# Patient Record
Sex: Male | Born: 1940 | Race: Asian | Hispanic: No | Marital: Married | Smoking: Former smoker
Health system: Southern US, Community
[De-identification: ages and names within clinical notes are randomized; demographics above are authoritative.]

## PROBLEM LIST (undated history)

## (undated) DIAGNOSIS — N189 Chronic kidney disease, unspecified: Secondary | ICD-10-CM

## (undated) DIAGNOSIS — R0602 Shortness of breath: Secondary | ICD-10-CM

## (undated) DIAGNOSIS — I351 Nonrheumatic aortic (valve) insufficiency: Secondary | ICD-10-CM

## (undated) DIAGNOSIS — E119 Type 2 diabetes mellitus without complications: Secondary | ICD-10-CM

## (undated) DIAGNOSIS — K7460 Unspecified cirrhosis of liver: Secondary | ICD-10-CM

## (undated) DIAGNOSIS — E785 Hyperlipidemia, unspecified: Secondary | ICD-10-CM

## (undated) DIAGNOSIS — R011 Cardiac murmur, unspecified: Secondary | ICD-10-CM

## (undated) DIAGNOSIS — K219 Gastro-esophageal reflux disease without esophagitis: Secondary | ICD-10-CM

## (undated) DIAGNOSIS — I340 Nonrheumatic mitral (valve) insufficiency: Secondary | ICD-10-CM

## (undated) DIAGNOSIS — M109 Gout, unspecified: Secondary | ICD-10-CM

## (undated) DIAGNOSIS — J42 Unspecified chronic bronchitis: Secondary | ICD-10-CM

## (undated) DIAGNOSIS — R079 Chest pain, unspecified: Secondary | ICD-10-CM

## (undated) DIAGNOSIS — J45909 Unspecified asthma, uncomplicated: Secondary | ICD-10-CM

## (undated) DIAGNOSIS — I10 Essential (primary) hypertension: Secondary | ICD-10-CM

## (undated) DIAGNOSIS — R569 Unspecified convulsions: Secondary | ICD-10-CM

## (undated) HISTORY — DX: Chest pain, unspecified: R07.9

## (undated) HISTORY — PX: CATARACT EXTRACTION W/ INTRAOCULAR LENS  IMPLANT, BILATERAL: SHX1307

## (undated) HISTORY — PX: CARDIAC VALVE REPLACEMENT: SHX585

## (undated) HISTORY — DX: Shortness of breath: R06.02

## (undated) HISTORY — DX: Nonrheumatic aortic (valve) insufficiency: I35.1

## (undated) HISTORY — DX: Hyperlipidemia, unspecified: E78.5

## (undated) HISTORY — DX: Nonrheumatic mitral (valve) insufficiency: I34.0

## (undated) HISTORY — PX: CARDIAC CATHETERIZATION: SHX172

## (undated) HISTORY — DX: Unspecified cirrhosis of liver: K74.60

## (undated) HISTORY — DX: Essential (primary) hypertension: I10

## (undated) HISTORY — DX: Gastro-esophageal reflux disease without esophagitis: K21.9

---

## 2008-07-04 ENCOUNTER — Encounter: Admission: RE | Admit: 2008-07-04 | Discharge: 2008-07-04 | Payer: Self-pay | Admitting: Pulmonary Disease

## 2008-08-09 ENCOUNTER — Ambulatory Visit (HOSPITAL_COMMUNITY): Admission: RE | Admit: 2008-08-09 | Discharge: 2008-08-09 | Payer: Self-pay | Admitting: General Practice

## 2009-02-15 ENCOUNTER — Encounter: Admission: RE | Admit: 2009-02-15 | Discharge: 2009-02-15 | Payer: Self-pay | Admitting: Infectious Diseases

## 2009-04-09 ENCOUNTER — Emergency Department (HOSPITAL_COMMUNITY): Admission: EM | Admit: 2009-04-09 | Discharge: 2009-04-09 | Payer: Self-pay | Admitting: Emergency Medicine

## 2009-07-22 ENCOUNTER — Ambulatory Visit: Payer: Self-pay | Admitting: Physician Assistant

## 2009-07-22 DIAGNOSIS — I10 Essential (primary) hypertension: Secondary | ICD-10-CM | POA: Insufficient documentation

## 2009-07-23 ENCOUNTER — Encounter: Payer: Self-pay | Admitting: Physician Assistant

## 2009-07-24 ENCOUNTER — Encounter: Payer: Self-pay | Admitting: Physician Assistant

## 2009-07-24 DIAGNOSIS — Z8639 Personal history of other endocrine, nutritional and metabolic disease: Secondary | ICD-10-CM

## 2009-07-24 DIAGNOSIS — Z862 Personal history of diseases of the blood and blood-forming organs and certain disorders involving the immune mechanism: Secondary | ICD-10-CM | POA: Insufficient documentation

## 2009-07-24 DIAGNOSIS — R7309 Other abnormal glucose: Secondary | ICD-10-CM | POA: Insufficient documentation

## 2009-07-24 LAB — CONVERTED CEMR LAB
ALT: 76 units/L — ABNORMAL HIGH (ref 0–53)
Albumin: 4.2 g/dL (ref 3.5–5.2)
Barbiturate Quant, Ur: NEGATIVE
CO2: 26 meq/L (ref 19–32)
Calcium: 9.6 mg/dL (ref 8.4–10.5)
Chloride: 101 meq/L (ref 96–112)
Creatinine, Ser: 1.31 mg/dL (ref 0.40–1.50)
Creatinine,U: 267.7 mg/dL
Eosinophils Absolute: 0.4 10*3/uL (ref 0.0–0.7)
Eosinophils Relative: 5 % (ref 0–5)
HCT: 49.5 % (ref 39.0–52.0)
Lymphs Abs: 2.1 10*3/uL (ref 0.7–4.0)
MCV: 90.3 fL (ref 78.0–100.0)
Methadone: NEGATIVE
Monocytes Relative: 7 % (ref 3–12)
Neutrophils Relative %: 60 % (ref 43–77)
Opiate Screen, Urine: NEGATIVE
Phencyclidine (PCP): NEGATIVE
Potassium: 3.8 meq/L (ref 3.5–5.3)
Propoxyphene: NEGATIVE
RBC: 5.48 M/uL (ref 4.22–5.81)
TSH: 1.033 microintl units/mL (ref 0.350–4.500)
WBC: 7.5 10*3/uL (ref 4.0–10.5)

## 2009-07-29 ENCOUNTER — Ambulatory Visit: Payer: Self-pay | Admitting: Physician Assistant

## 2009-07-30 DIAGNOSIS — E785 Hyperlipidemia, unspecified: Secondary | ICD-10-CM | POA: Insufficient documentation

## 2009-07-30 LAB — CONVERTED CEMR LAB
Calcium: 9.2 mg/dL (ref 8.4–10.5)
Chloride: 103 meq/L (ref 96–112)
Cholesterol: 203 mg/dL — ABNORMAL HIGH (ref 0–200)
Creatinine, Ser: 1.23 mg/dL (ref 0.40–1.50)
HDL goal, serum: 40 mg/dL
HDL: 35 mg/dL — ABNORMAL LOW (ref >39)
Triglycerides: 195 mg/dL — ABNORMAL HIGH (ref <150)

## 2009-08-01 ENCOUNTER — Ambulatory Visit (HOSPITAL_COMMUNITY): Admission: RE | Admit: 2009-08-01 | Discharge: 2009-08-01 | Payer: Self-pay | Admitting: Internal Medicine

## 2009-08-01 ENCOUNTER — Encounter (INDEPENDENT_AMBULATORY_CARE_PROVIDER_SITE_OTHER): Payer: Self-pay | Admitting: Internal Medicine

## 2009-08-02 ENCOUNTER — Encounter: Payer: Self-pay | Admitting: Physician Assistant

## 2009-08-02 DIAGNOSIS — I359 Nonrheumatic aortic valve disorder, unspecified: Secondary | ICD-10-CM | POA: Insufficient documentation

## 2009-08-05 ENCOUNTER — Ambulatory Visit: Payer: Self-pay | Admitting: Nurse Practitioner

## 2009-08-05 ENCOUNTER — Encounter: Payer: Self-pay | Admitting: Physician Assistant

## 2009-08-07 ENCOUNTER — Encounter (INDEPENDENT_AMBULATORY_CARE_PROVIDER_SITE_OTHER): Payer: Self-pay | Admitting: *Deleted

## 2009-08-08 LAB — CONVERTED CEMR LAB
HCV Ab: NEGATIVE
Hep A Total Ab: POSITIVE — AB
Hep B Core Total Ab: NEGATIVE
Hep B S Ab: NEGATIVE
Hgb A1c MFr Bld: 5.4 % (ref <5.7)

## 2009-08-13 ENCOUNTER — Encounter (INDEPENDENT_AMBULATORY_CARE_PROVIDER_SITE_OTHER): Payer: Self-pay | Admitting: *Deleted

## 2009-09-20 ENCOUNTER — Ambulatory Visit: Payer: Self-pay | Admitting: Physician Assistant

## 2009-09-20 ENCOUNTER — Telehealth (INDEPENDENT_AMBULATORY_CARE_PROVIDER_SITE_OTHER): Payer: Self-pay | Admitting: *Deleted

## 2009-09-20 DIAGNOSIS — R809 Proteinuria, unspecified: Secondary | ICD-10-CM | POA: Insufficient documentation

## 2009-09-20 LAB — CONVERTED CEMR LAB
Nitrite: NEGATIVE
Protein, U semiquant: 30
Specific Gravity, Urine: 1.025
Urobilinogen, UA: 0.2
pH: 5

## 2009-09-24 ENCOUNTER — Ambulatory Visit (HOSPITAL_COMMUNITY): Admission: RE | Admit: 2009-09-24 | Discharge: 2009-09-24 | Payer: Self-pay | Admitting: Internal Medicine

## 2009-09-24 DIAGNOSIS — R972 Elevated prostate specific antigen [PSA]: Secondary | ICD-10-CM | POA: Insufficient documentation

## 2009-09-24 LAB — CONVERTED CEMR LAB
ALT: 50 units/L (ref 0–53)
AST: 36 units/L (ref 0–37)
BUN: 23 mg/dL (ref 6–23)
Bacteria, UA: NONE SEEN
Calcium: 9.4 mg/dL (ref 8.4–10.5)
Casts: NONE SEEN /lpf
Creatinine, Ser: 1.29 mg/dL (ref 0.40–1.50)
Crystals: NONE SEEN
Ketones, ur: NEGATIVE mg/dL
Nitrite: NEGATIVE
PSA: 5.24 ng/mL — ABNORMAL HIGH (ref 0.10–4.00)
Specific Gravity, Urine: 1.015 (ref 1.005–1.030)
Squamous Epithelial / LPF: NONE SEEN /lpf
Total Bilirubin: 1.2 mg/dL (ref 0.3–1.2)
Urobilinogen, UA: 0.2 (ref 0.0–1.0)
pH: 5 (ref 5.0–8.0)

## 2009-09-27 ENCOUNTER — Telehealth: Payer: Self-pay | Admitting: Physician Assistant

## 2009-10-23 ENCOUNTER — Encounter: Payer: Self-pay | Admitting: Physician Assistant

## 2009-11-07 ENCOUNTER — Inpatient Hospital Stay (HOSPITAL_COMMUNITY): Admission: EM | Admit: 2009-11-07 | Discharge: 2009-11-08 | Payer: Self-pay | Admitting: Emergency Medicine

## 2009-11-07 ENCOUNTER — Encounter: Payer: Self-pay | Admitting: Physician Assistant

## 2009-11-23 ENCOUNTER — Encounter: Payer: Self-pay | Admitting: Physician Assistant

## 2010-03-16 ENCOUNTER — Emergency Department (HOSPITAL_COMMUNITY)
Admission: EM | Admit: 2010-03-16 | Discharge: 2010-03-16 | Payer: Self-pay | Source: Home / Self Care | Admitting: Emergency Medicine

## 2010-03-27 ENCOUNTER — Emergency Department (HOSPITAL_COMMUNITY)
Admission: EM | Admit: 2010-03-27 | Discharge: 2010-03-27 | Payer: Self-pay | Source: Home / Self Care | Admitting: Family Medicine

## 2010-04-20 ENCOUNTER — Encounter: Payer: Self-pay | Admitting: Internal Medicine

## 2010-04-29 NOTE — Progress Notes (Signed)
Summary: Referrals update  Phone Note From Other Clinic   Caller: Referral Coordinator Summary of Call: Pt is aware of the following appts Cardio: [name] Cardio 10-08-09 @ 10:30am with Dr.Varanasi GI : Nicholes Mango 10-21-09 @ 10:15am with Dr.Hung... Pt planning to R/S Urology: Pt states that he already has an appt Initial call taken by: Candi Leash,  September 27, 2009 9:04 AM  Follow-up for Phone Call        I don't think he has urology appt yet.  I just made that referral when his labs came back.  Patient must be mistaken.  Please check on again. Follow-up by: Tereso Newcomer PA-C,  September 27, 2009 1:38 PM  Additional Follow-up for Phone Call Additional follow up Details #1::        Pt was correct he has an F/U appt @ [facility] Urological on 11-18-09 @ 8:50am with Dr.Eskew Additional Follow-up by: Candi Leash,  October 04, 2009 11:01 AM    Additional Follow-up for Phone Call Additional follow up Details #2::    Please send my notes and labs to his urologist.  PSA was elevated. Tereso Newcomer PA-C  October 04, 2009 2:58 PM   Additional Follow-up for Phone Call Additional follow up Details #3:: Details for Additional Follow-up Action Taken: done... Additional Follow-up by: Candi Leash,  October 04, 2009 3:54 PM

## 2010-04-29 NOTE — Progress Notes (Signed)
Summary: Office Visit//DEPRESSION SCREENING  Office Visit//DEPRESSION SCREENING   Imported By: Arta Bruce 10/14/2009 09:53:54  _____________________________________________________________________  External Attachment:    Type:   Image     Comment:   External Document

## 2010-04-29 NOTE — Miscellaneous (Signed)
Summary: Colonoscopy Normal 8.2011 . . . Marland Kitchenrepeat 2021  Clinical Lists Changes  Observations: Added new observation of COLONNXTDUE: 10/2019 (11/23/2009 16:11) Added new observation of PRIMARY MD: Tereso Newcomer, PA-C (11/23/2009 16:11) Added new observation of COLONOSCOPY: normal (11/07/2009 16:12) Added new observation of COLONOSCOPY:  Location:  [facility].    (11/07/2009 16:12) Added new observation of COLONRECACT: Repeat colonoscopy in 10 years.   (11/07/2009 16:11) Added new observation of COLONOSCOPY:  Results: Normal. Results: Hemorrhoids.      (11/07/2009 16:11)      Colonoscopy  Procedure date:  11/07/2009  Findings:       Results: Normal. Results: Hemorrhoids.       Comments:      Repeat colonoscopy in 10 years.    Colonoscopy  Procedure date:  11/07/2009  Findings:       Location:  [facility].      Preventive Care Screening  Colonoscopy:    Date:  11/07/2009    Next Due:  10/2019    Results:   Results: Normal. Results: Hemorrhoids.

## 2010-04-29 NOTE — Letter (Signed)
Summary: [facility]  [facility]   Imported By: Arta Bruce 11/28/2009 15:37:30  _____________________________________________________________________  External Attachment:    Type:   Image     Comment:   External Document

## 2010-04-29 NOTE — Letter (Signed)
Summary: TEST ORDER FORM//ULTRASOUND  TEST ORDER FORM//ULTRASOUND   Imported By: Arta Bruce 08/02/2009 10:37:34  _____________________________________________________________________  External Attachment:    Type:   Image     Comment:   External Document

## 2010-04-29 NOTE — Letter (Signed)
Summary: *HSN Results Follow up  [facility]  [address] [place], [place] [address]   Phone: [phone]  Fax: [phone]      08/13/2009   Ryan Burke [address] [place], [place]  [address]   Dear  Mr. [name],                            ____S.Drinkard,FNP   __X__S. Weaver,PA       ____B. McPherson,MD   ____V. Rankins,MD    ____E. Mulberry,MD    ____N. Daphine Deutscher, FNP  ____D. Reche Dixon, MD    ____K. Philipp Deputy, MD    ____Other     This letter is to inform you that your recent test(s):  _______Pap Smear    ___X____Lab Test     _______X-ray    _______ is within acceptable limits  _______ requires a medication change  ____X___ requires a follow-up lab visit  _______ requires a follow-up visit with your provider   Comments:   We have been unable to reach you by phone. You do not have diabetes. [name] would like for you to schedule a lab visit  to check your liver. Please call our office to schedule.        _________________________________________________________ If you have any questions, please contact our office                     Sincerely,  Dutch Quint RN [facility]

## 2010-04-29 NOTE — Letter (Signed)
Summary: TEST ORDER FORM//ULTRASOUND//APPT DATE & TIME  TEST ORDER FORM//ULTRASOUND//APPT DATE & TIME   Imported By: Arta Bruce 10/02/2009 15:49:07  _____________________________________________________________________  External Attachment:    Type:   Image     Comment:   External Document

## 2010-04-29 NOTE — Assessment & Plan Note (Signed)
Summary: FU FOR CPE///KT   Vital Signs:  Patient profile:   70 year old male Weight:      146.3 pounds Temp:     97.7 degrees F oral Pulse rate:   64 / minute Pulse rhythm:   regular Resp:     24 per minute BP sitting:   111 / 65  (right arm)  Vitals Entered By: Arm[place] Shannon (September 20, 2009 11:27 AM) CC: follow-up visit, CPE, Hypertension Management Is Patient Diabetic? No Pain Assessment Patient in pain? no       Does patient need assistance? Functional Status Self care Ambulation Normal   Primary Care Provider:  Tereso Newcomer, PA-C  CC:  follow-up visit, CPE, and Hypertension Management.  History of Present Illness: Here for CPE.  Cataracts:  Had eye surgery.  Doing well.  Elevated LFTs:  No h/o alcohol abuse.  Hep labs neg for Hep B or Hep C.  No f/u LFTs.  No abd. u/s done yet.  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  Does note DOE.  Describes NYHA class 2.  No chest pain.  Had some dyspepsia with some med. he took several mos ago, but this stopped when he stopped the medication.  No syncope.  Sleeps on one pillow.  No PND.  No edema.  Health maint: PHQ9=6.  May not have completely understood questionaire.  Denies being depressed.  No suicidal thoughts.  Declines going to counseling.   Hypertension History:      Positive major cardiovascular risk factors include male age 1 years old or older, hyperlipidemia, and hypertension.  Negative major cardiovascular risk factors include non-tobacco-user status.        Further assessment for target organ damage reveals no history of ASHD, stroke/TIA, or peripheral vascular disease.    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 Aortic Insufficiency (mod to severe by Echo 07/2009)  Past Surgical History: Cataract extraction  Family History: Reviewed history from 07/22/2009 and no changes required. No colon or prostate cancer. No heart disease.  Social History: Reviewed history from 07/22/2009 and no changes required. Former Smoker   a.  15 pack years  Alcohol use-no Drug use-no Married  Review of Systems      See HPI General:  Denies chills and fever. Eyes:  Denies blurring. CV:  See HPI; Complains of shortness of breath with exertion; denies chest pain or discomfort and fainting. Resp:  Denies cough. GI:  Denies bloody stools and dark tarry stools. GU:  Denies dysuria and nocturia. MS:  Denies joint pain. Derm:  Denies rash. Neuro:  Denies headaches. Psych:  Denies depression and suicidal thoughts/plans. Heme:  Denies bleeding.  Physical Exam  General:  alert, well-developed, and well-nourished.   Head:  normocephalic and atraumatic.   Eyes:  pupils  equal, pupils round, pupils reactive to light, and no optic disk abnormalities.   Ears:  R ear normal and L ear normal.   Nose:  no external deformity.   Mouth:  OP pink patient unable to follow directions for exam Neck:  supple, no thyromegaly, no carotid bruits, and no cervical lymphadenopathy.   Chest Wall:  no deformities.   Breasts:  no gynecomastia.   Lungs:  normal breath sounds and no wheezes.   Heart:  normal rate and regular rhythm.   2/6 diastolic murmur at 3rd LICS  Abdomen:  soft, non-tender,  normal bowel sounds, and no hepatomegaly.   Rectal:  no external abnormalities, no hemorrhoids, normal sphincter tone, no masses, no tenderness, no fissures, no fistulae, and no perianal rash.   Genitalia:  uncircumcised, no hydrocele, no varicocele, no scrotal masses, no testicular masses or atrophy, no cutaneous lesions, and no urethral discharge.   Prostate:  no nodules, no asymmetry, no induration, and 1+ enlarged.   Msk:  normal ROM.   Pulses:  R posterior tibial normal, R dorsalis pedis normal, L posterior tibial normal, and L dorsalis pedis normal.   Extremities:  no edema Neurologic:  alert & oriented X3, cranial nerves II-XII intact, strength normal in all extremities, and DTRs symmetrical and normal.   Skin:  turgor normal.   Psych:  normally interactive.     Impression & Recommendations:  Problem # 1:  PROTEINURIA (ICD-791.0) 30 on u/a check repeat if +; get 24 hour uine  Problem # 2:  AORTIC INSUFFICIENCY, MODERATE (ICD-424.1) had fairly wide pulse pressure initially . . better now end diast int demension of LV 53 mm with upper limit of normal 52 mm has some SOB but is only NYHA 2 needs to see cardiology will put in referral  Orders: Cardiology Referral (Cardiology)  Problem # 3:  HYPERLIPIDEMIA (ICD-272.4) needs tx check up on LFTs first if fatty liver, start statin  Problem # 4:  LIVER FUNCTION TESTS, ABNORMAL, HX OF (ICD-V12.2) check repeat LFTs and u/s hep serologies were ok  Orders: T-Comprehensive Metabolic Panel ([address]) Ultrasound (Ultrasound)  Problem # 5:  PREVENTIVE HEALTH CARE (ICD-V70.0) discussed PSA . Marland Kitchen . wants to get it stool cards Td and Pnuemovax today PHQ9=6 but not sure he knew how to fill out . . .denies being sad  Orders: T-Hemoccult Cards-Multiple ([address]) T-PSA ([address]-91478) T-Urinalysis ([address]) Gastroenterology Referral (GI)  Problem # 6:  ESSENTIAL HYPERTENSION, BENIGN (ICD-401.1) Assessment: Improved f/u in 6  mos  His updated medication list for this problem includes:    Lisinopril 10 Mg Tabs (Lisinopril) .Marland Kitchen... Take 1 tablet by mouth once a day for blood pressure  Orders: T-Comprehensive Metabolic Panel ([address]295) T-Urinalysis ([address]-24401)  Complete Medication List: 1)  Lisinopril 10 Mg Tabs (Lisinopril) .... Take 1 tablet by mouth once a day for blood pressure  Hypertension Assessment/Plan:      The patient's hypertensive risk group is category B: At least one risk factor (excluding diabetes) with no target organ damage.  His calculated 10 year risk of coronary heart disease is 18 %.  Today's blood pressure is 111/65.     Patient Instructions: 1)  Td and Pneumovax today. 2)  Complete stool cards and return in the next 2 weeks. 3)  Someone will call you to arrange a cardiology and a gastroenterology appointment. 4)  Make sure we have a good phone number for you. 5)  Repeat U/A in 2 weeks. 6)  Please schedule a follow-up appointment in  6 months with Khaleesi Gruel for blood pressure.   Laboratory Results   Urine Tests    Routine Urinalysis   Glucose: negative   (Normal Range: Negative) Bilirubin: negative   (Normal Range: Negative) Ketone: negative   (Normal Range: Negative) Spec. Gravity: 1.025   (Normal Range: 1.003-1.035) Blood: negative   (Normal Range: Negative) pH: 5.0   (Normal Range: 5.0-8.0) Protein: 30   (Normal Range: Negative) Urobilinogen: 0.2   (Normal Range: 0-1) Nitrite: negative   (Normal Range: Negative) Leukocyte Esterace: negative   (Normal Range: Negative)

## 2010-04-29 NOTE — Letter (Signed)
Summary: *HSN Results Follow up  [facility]  [address] [place], [place] [address]   Phone: [phone]  Fax: [phone]      08/07/2009   BRUIN BOLGER [address] [place], [place]  [address]   Dear  Mr. Handy Boehle,                            ____S.Drinkard,FNP   ____D. Gore,FNP       ____B. McPherson,MD   ____V. Rankins,MD    ____E. Mulberry,MD    ____N. Daphine Deutscher, FNP  ____D. Reche Dixon, MD    ____K. Philipp Deputy, MD    ____Other     This letter is to inform you that your recent test(s):  _______Pap Smear    _______Lab Test     _______X-ray    _______ is within acceptable limits  _______ requires a medication change  _______ requires a follow-up lab visit  _______ requires a follow-up visit with your provider   Comments:  We have been trying to reach you.  Please give the office a call at your earliest convenience.       _________________________________________________________ If you have any questions, please contact our office                     Sincerely,  Armenia Shannon [facility]

## 2010-04-29 NOTE — Progress Notes (Signed)
Summary: Cardiology and GI referrals  Phone Note Outgoing Call   Summary of Call: 1.  Cardiology referral for moderate to severe aortic insufficiency.  2.  GI for screening colonoscopy.  Has medicaid. Initial call taken by: Brynda Rim,  September 20, 2009 12:53 PM      Tetanus/Td Vaccine    Vaccine Type: Tdap    Site: right deltoid    Mfr: Sanofi Pasteur    Dose: 0.5 ml    Route: IM    Given by: Armenia Shannon    Exp. Date: 01/30/2012    Lot #: [id number]    VIS given: 02/15/07 version given September 20, 2009.  Pneumovax Vaccine    Vaccine Type: Pneumovax    Site: left deltoid    Mfr: Merck    Dose: 0.5 ml    Route: IM    Given by: Armenia Shannon    Exp. Date: 04/30/2010    Lot #: [id number]    VIS given: 10/26/95 version given September 20, 2009.

## 2010-04-29 NOTE — Assessment & Plan Note (Signed)
Summary: NEW MEDICAID TO ESTAB///KT   Vital Signs:  Patient profile:   70 year old male Height:      62.5 inches Weight:      146.56 pounds BMI:     26.47 Temp:     98 degrees F Pulse rate:   69 / minute Pulse rhythm:   regular Resp:     24 per minute BP sitting:   161 / 72  (left arm) Cuff size:   regular  Vitals Entered By: Chauncy Passy, SMA  Serial Vital Signs/Assessments:  Time      Position  BP       Pulse  Resp  Temp     By 3:41 PM             142/64                         Tereso Newcomer PA-C 3:41 PM             144/60                         Tereso Newcomer PA-C  CC: New Pt. here to establish care w/us. Pt, via interpreter, states he has a cataract surgery w/Hecker opthal. on 08/30/09 and needs to have a complete PE done before surgery.  Is Patient Diabetic? No Pain Assessment Patient in pain? no       Does patient need assistance? Functional Status Self care Ambulation Normal   Primary Care Provider:  Tereso Newcomer, PA-C  CC:  New Pt. here to establish care w/us. Pt, via interpreter, and states he has a cataract surgery w/[facility]cker opthal. on 08/30/09 and needs to have a complete PE done before surgery. Marland Kitchen  History of Present Illness: New patient from [place].  Here for 15 months.  Needs cataract surgery.  Needs medical evaluation before.  Denies any complaints.  Son in law interprets.   Habits & Providers  Alcohol-Tobacco-Diet     Tobacco Status: quit  Exercise-Depression-Behavior     Drug Use: no  Current Medications (verified): 1)  None  Allergies (verified): No Known Drug Allergies  Past History:  Past Medical History: Cataracts  Family History: No colon or prostate cancer. No heart disease.  Social History: Former Smoker   a.  15 pack years  Alcohol use-no Drug use-no Married Smoking Status:  quit Drug Use:  no  Review of Systems      See HPI General:  Denies chills and fever. CV:  Denies chest pain or discomfort, difficulty breathing  at night, difficulty breathing while lying down, fainting, and shortness of breath with exertion. Resp:  Denies cough. GI:  Denies bloody stools and dark tarry stools. GU:  Denies hematuria.  Physical Exam  General:  alert, well-developed, and well-nourished.   Head:  normocephalic and atraumatic.   Eyes:  pupils equal, pupils round, pupils reactive to light, and no optic disk abnormalities.   Nose:  no external deformity.   Neck:  supple, no thyromegaly, no JVD, no carotid bruits, and no cervical lymphadenopathy.   Lungs:  normal breath sounds, no crackles, and no wheezes.   Heart:  normal S1 and S2 RRR 2/6 diastolic murmur heard best at the left 3 rd intercostal space  Abdomen:  soft, non-tender, normal bowel sounds, and no hepatomegaly.   Extremities:  no edema  Neurologic:  alert & oriented X3 and cranial nerves II-XII intact.  Skin:  turgor normal.   Psych:  normally interactive.     Impression & Recommendations:  Problem # 1:  ESSENTIAL HYPERTENSION, BENIGN (ICD-401.1)  mild will start lisinopril 10 mg once daily  needs to have his BP under control for his surgery also needs afterload reducer . .. see below LVH on EKG. . . may be from HTN; ? related to murmur echo is pending  Orders: 2 D Echo (2 D Echo) T-Comprehensive Metabolic Panel ([address]) T-TSH ([address]11-91478) EKG w/ Interpretation (93[address])  His updated medication list for this problem includes:    Lisinopril 10 Mg Tabs (Lisinopril) .Marland Kitchen... Take 1 tablet by mouth once a day for blood pressure  Problem # 2:  MURMUR (ICD-785.2)  has murmur of aortic insufficiency asymptomatic at this point get echo  Orders: 2 D Echo (2 D Echo)  Problem # 3:  Preventive Health Care (ICD-V70.0)  bring back for complete physical  Orders: T-Comprehensive Metabolic Panel ([address]) T-CBC w/Diff (57846-96295) T-Drug Screen-Urine, (single) (28413-24401) T-HIV Antibody  (Reflex) (02725-36644) T-Syphilis Test (RPR)  (03474-25956) T-TSH (38756-43329) EKG w/ Interpretation (93[address])  Complete Medication List: 1)  Lisinopril 10 Mg Tabs (Lisinopril) .... Take 1 tablet by mouth once a day for blood pressure  Patient Instructions: 1)  Return to the lab in 1 week for bloop pressure check and fasting labs: Lipids and BMET (Dx v70.0, 401.1) 2)  Schedule follow up with Lucy Boardman in 4-6 weeks for CPE. 3)  We will set you up for an echocardiogram to evaluate your heart murmur.  Please get the test done. Prescriptions: LISINOPRIL 10 MG TABS (LISINOPRIL) Take 1 tablet by mouth once a day for blood pressure  #30 x 5   Entered and Authorized by:   Tereso Newcomer PA-C   Signed by:   Tereso Newcomer PA-C on 07/22/2009   Method used:   Print then Give to Patient   RxID:   [id number]    EKG  Procedure date:  07/22/2009  Findings:      Normal sinus rhythm with rate of:  73 normal axis LVH QTc 471 nonspecific ST-TW changes

## 2010-04-29 NOTE — Letter (Signed)
Summary: [facility]  [facility]   Imported By: Arta Bruce 11/25/2009 16:13:16  _____________________________________________________________________  External Attachment:    Type:   Image     Comment:   External Document

## 2010-06-09 LAB — POCT I-STAT, CHEM 8
Calcium, Ion: 1.06 mmol/L — ABNORMAL LOW (ref 1.12–1.32)
Chloride: 105 mEq/L (ref 96–112)
HCT: 44 % (ref 39.0–52.0)
Hemoglobin: 15 g/dL (ref 13.0–17.0)
TCO2: 25 mmol/L (ref 0–100)

## 2010-06-09 LAB — URINALYSIS, ROUTINE W REFLEX MICROSCOPIC
Bilirubin Urine: NEGATIVE
Glucose, UA: NEGATIVE mg/dL
Ketones, ur: NEGATIVE mg/dL
Leukocytes, UA: NEGATIVE
Nitrite: NEGATIVE
Protein, ur: >300 mg/dL — AB
Specific Gravity, Urine: 1.009 (ref 1.005–1.030)
Urobilinogen, UA: 0.2 mg/dL (ref 0.0–1.0)
pH: 5.5 (ref 5.0–8.0)

## 2010-06-09 LAB — URINE CULTURE

## 2010-06-09 LAB — COMPREHENSIVE METABOLIC PANEL
ALT: 35 U/L (ref 0–53)
Alkaline Phosphatase: 97 U/L (ref 39–117)
CO2: 24 mEq/L (ref 19–32)
GFR calc non Af Amer: >60 mL/min (ref >60)
Glucose, Bld: 111 mg/dL — ABNORMAL HIGH (ref 70–99)
Potassium: 3.8 mEq/L (ref 3.5–5.1)
Sodium: 143 mEq/L (ref 135–145)
Total Bilirubin: 0.8 mg/dL (ref 0.3–1.2)

## 2010-06-09 LAB — DIFFERENTIAL
Basophils Absolute: 0 10*3/uL (ref 0.0–0.1)
Basophils Relative: 0 % (ref 0–1)
Basophils Relative: 0 % (ref 0–1)
Eosinophils Absolute: 0.3 10*3/uL (ref 0.0–0.7)
Monocytes Absolute: 0.7 10*3/uL (ref 0.1–1.0)
Neutro Abs: 6.3 10*3/uL (ref 1.7–7.7)
Neutrophils Relative %: 56 % (ref 43–77)
Neutrophils Relative %: 75 % (ref 43–77)

## 2010-06-09 LAB — URINE MICROSCOPIC-ADD ON

## 2010-06-09 LAB — CBC
HCT: 42.9 % (ref 39.0–52.0)
HCT: 44.2 % (ref 39.0–52.0)
Hemoglobin: 15.8 g/dL (ref 13.0–17.0)
MCHC: 34.7 g/dL (ref 30.0–36.0)
RDW: 12.6 % (ref 11.5–15.5)
WBC: 6.1 10*3/uL (ref 4.0–10.5)

## 2010-06-09 LAB — GLUCOSE, CAPILLARY
Glucose-Capillary: 105 mg/dL — ABNORMAL HIGH (ref 70–99)
Glucose-Capillary: 113 mg/dL — ABNORMAL HIGH (ref 70–99)

## 2010-06-09 LAB — POCT URINALYSIS DIPSTICK
Glucose, UA: NEGATIVE mg/dL
Nitrite: NEGATIVE
Specific Gravity, Urine: 1.025 (ref 1.005–1.030)
Urobilinogen, UA: 1 mg/dL (ref 0.0–1.0)

## 2010-06-13 LAB — DIFFERENTIAL
Basophils Relative: 0 % (ref 0–1)
Eosinophils Absolute: 0.1 10*3/uL (ref 0.0–0.7)
Lymphs Abs: 1 10*3/uL (ref 0.7–4.0)
Monocytes Absolute: 0.6 10*3/uL (ref 0.1–1.0)
Monocytes Relative: 4 % (ref 3–12)

## 2010-06-13 LAB — URINE CULTURE: Culture: NO GROWTH

## 2010-06-13 LAB — CBC
HCT: 46 % (ref 39.0–52.0)
Hemoglobin: 16.1 g/dL (ref 13.0–17.0)
MCH: 31 pg (ref 26.0–34.0)
MCHC: 35 g/dL (ref 30.0–36.0)
MCV: 88.5 fL (ref 78.0–100.0)

## 2010-06-13 LAB — CULTURE, BLOOD (ROUTINE X 2)
Culture: NO GROWTH
Culture: NO GROWTH

## 2010-06-13 LAB — POCT CARDIAC MARKERS
Myoglobin, poc: 178 ng/mL (ref 12–200)
Troponin i, poc: <0.05 ng/mL (ref 0.00–0.09)

## 2010-06-13 LAB — BRAIN NATRIURETIC PEPTIDE: Pro B Natriuretic peptide (BNP): <30 pg/mL (ref 0.0–100.0)

## 2010-06-13 LAB — CK TOTAL AND CKMB (NOT AT ARMC)
CK, MB: 3.8 ng/mL (ref 0.3–4.0)
Total CK: 119 U/L (ref 7–232)

## 2010-06-13 LAB — URINALYSIS, ROUTINE W REFLEX MICROSCOPIC
Bilirubin Urine: NEGATIVE
Hgb urine dipstick: NEGATIVE
Nitrite: NEGATIVE
Protein, ur: NEGATIVE mg/dL
Specific Gravity, Urine: 1.024 (ref 1.005–1.030)
Urobilinogen, UA: 0.2 mg/dL (ref 0.0–1.0)

## 2010-06-13 LAB — POCT I-STAT, CHEM 8
BUN: 16 mg/dL (ref 6–23)
Calcium, Ion: 1.15 mmol/L (ref 1.12–1.32)
Chloride: 109 mEq/L (ref 96–112)
Creatinine, Ser: 1.3 mg/dL (ref 0.4–1.5)
TCO2: 26 mmol/L (ref 0–100)

## 2010-06-13 LAB — CARDIAC PANEL(CRET KIN+CKTOT+MB+TROPI)
CK, MB: 3.4 ng/mL (ref 0.3–4.0)
Relative Index: INVALID (ref 0.0–2.5)
Total CK: 89 U/L (ref 7–232)
Troponin I: 0.01 ng/mL (ref 0.00–0.06)

## 2010-06-13 LAB — TROPONIN I: Troponin I: 0.02 ng/mL (ref 0.00–0.06)

## 2010-06-15 LAB — URINE CULTURE: Culture: NO GROWTH

## 2010-06-15 LAB — URINALYSIS, ROUTINE W REFLEX MICROSCOPIC
Leukocytes, UA: NEGATIVE
Nitrite: NEGATIVE
Specific Gravity, Urine: 1.01 (ref 1.005–1.030)
Urobilinogen, UA: 0.2 mg/dL (ref 0.0–1.0)
pH: 5.5 (ref 5.0–8.0)

## 2010-06-15 LAB — URINE MICROSCOPIC-ADD ON

## 2010-07-30 ENCOUNTER — Ambulatory Visit (HOSPITAL_COMMUNITY)
Admission: RE | Admit: 2010-07-30 | Discharge: 2010-07-30 | Disposition: A | Discharge status: Home / Self Care | Payer: Medicaid Other | Source: Ambulatory Visit | Attending: Interventional Cardiology | Admitting: Interventional Cardiology

## 2010-07-30 DIAGNOSIS — I080 Rheumatic disorders of both mitral and aortic valves: Secondary | ICD-10-CM | POA: Insufficient documentation

## 2011-12-16 IMAGING — CT CT ANGIO CHEST
2 of 6 series · 19 of 36 positions shown · IV contrast (APPLIED)
Comparison: No comparison CT.

CLINICAL DATA: Laryngeal spasm status post colonoscopy.  Recently
treated for TB.  Wheezing. Shortness of breath.

CT ANGIOGRAPHY CHEST WITH CONTRAST
TECHNIQUE: Multidetector CT imaging of the chest was performed
using the standard protocol during bolus administration of
intravenous contrast.  Multiplanar CT image reconstructions
including MIPs were obtained to evaluate the vascular anatomy.
Contrast:  100 ml Emnipaque-YFF.

[Series 8: pulm embolism 1.0 b25f thins · axial · 0.66mm/px · z∈[-211,-8]mm · 18 of 227 slices shown]
[im 12/227  lung]
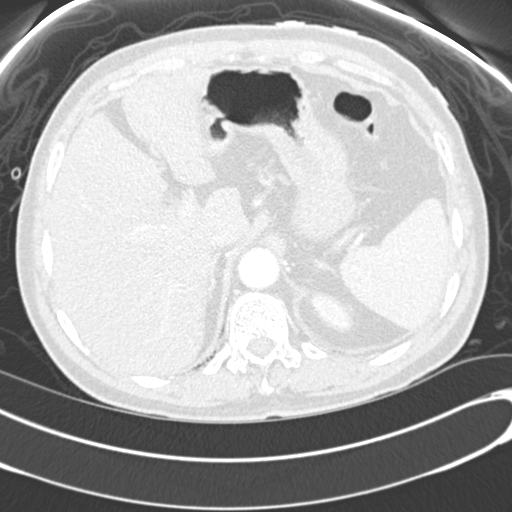
[im 23/227  mediastinal]
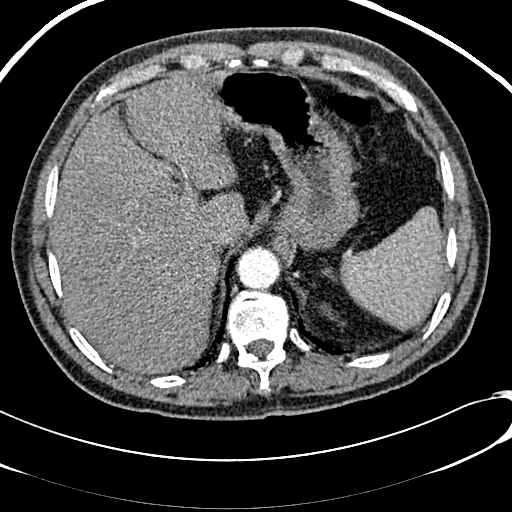
[im 34/227  lung]
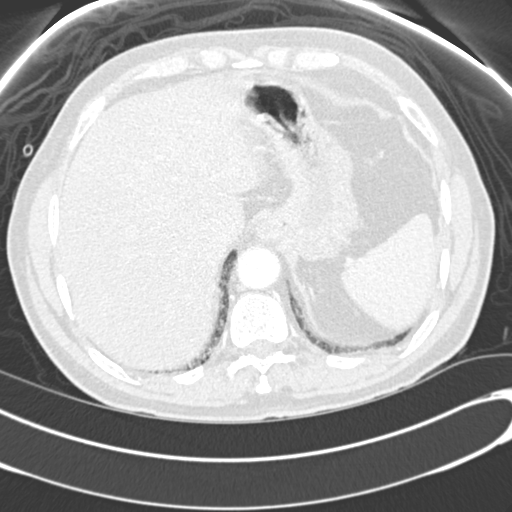
[im 46/227  mediastinal]
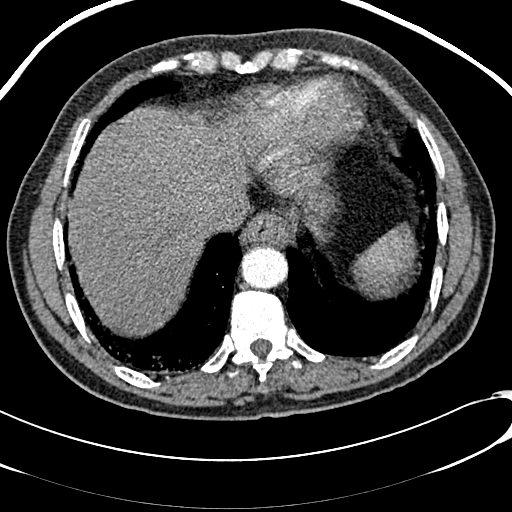
[im 57/227  lung]
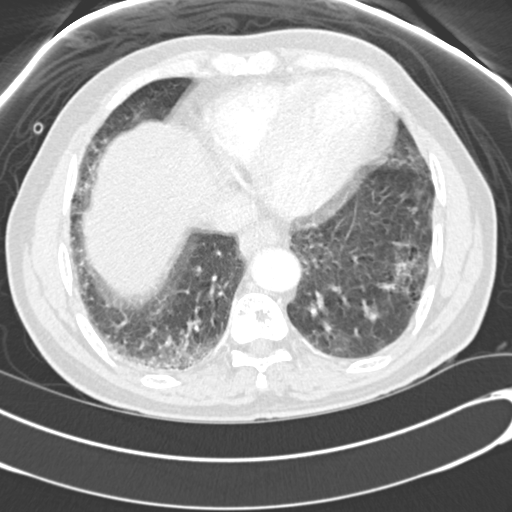
[im 68/227  mediastinal]
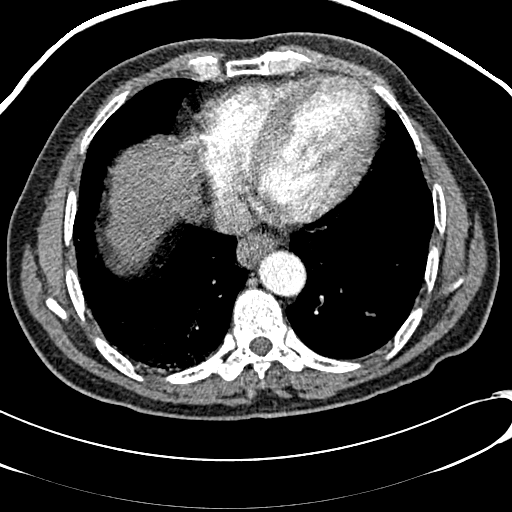
[im 80/227  lung]
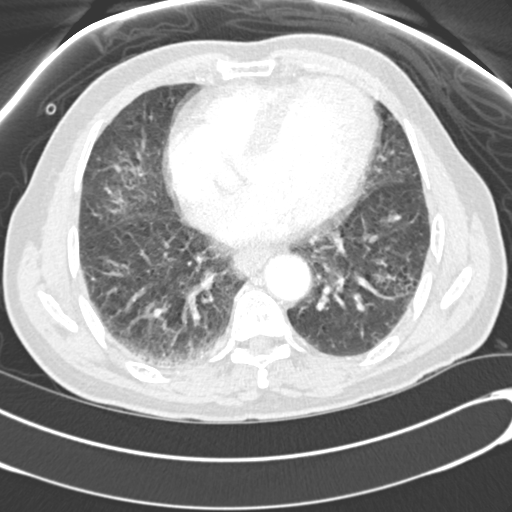
[im 91/227  mediastinal]
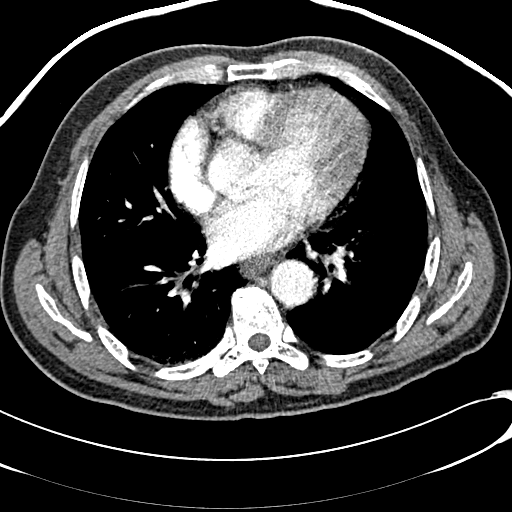
[im 102/227  lung]
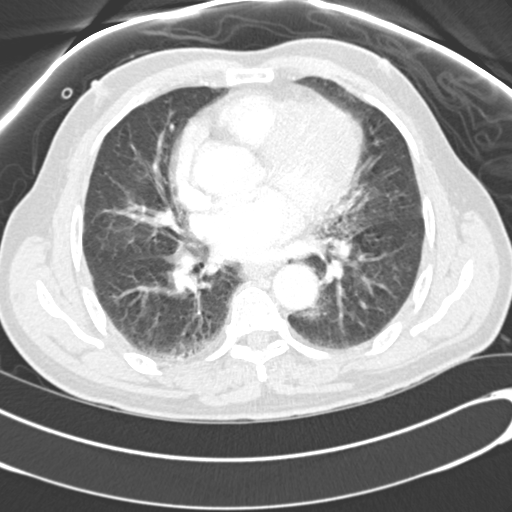
[im 125/227  mediastinal]
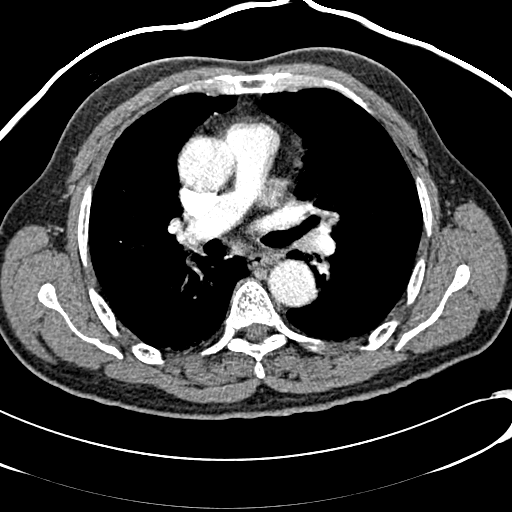
[im 136/227  lung]
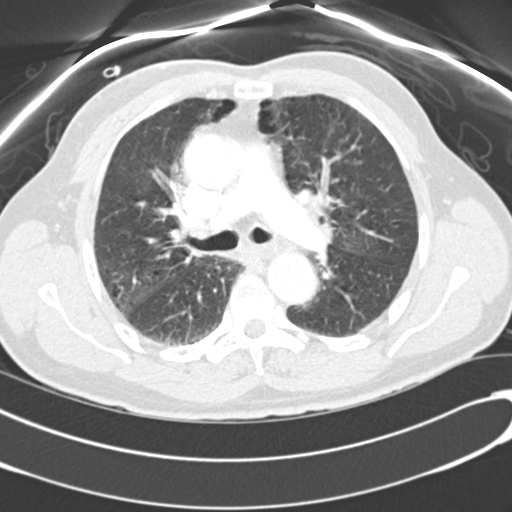
[im 147/227  mediastinal]
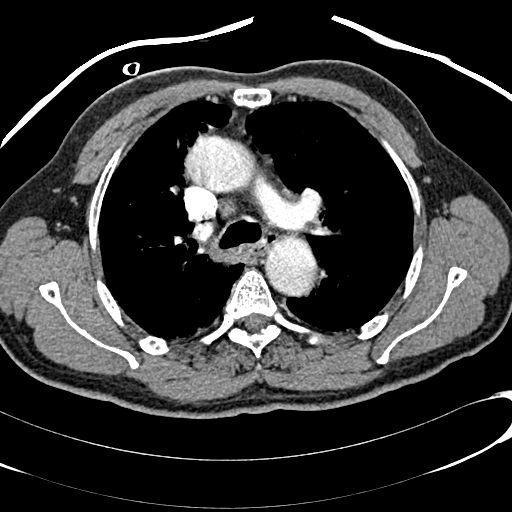
[im 159/227  lung]
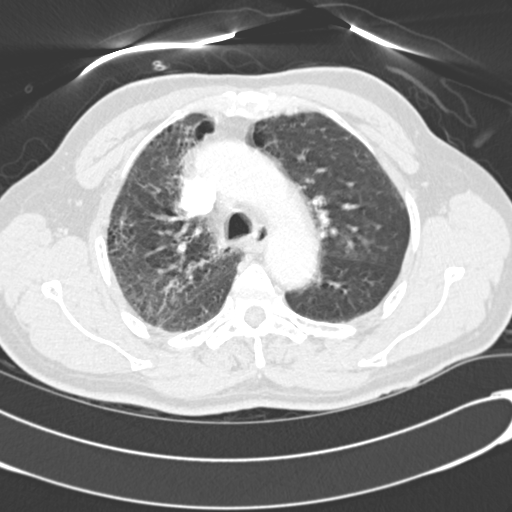
[im 170/227  mediastinal]
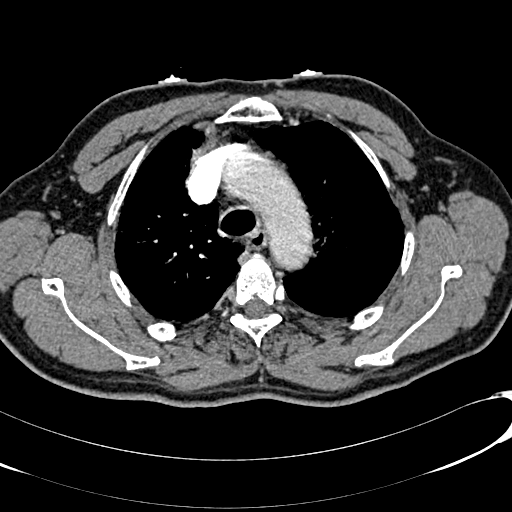
[im 181/227  lung]
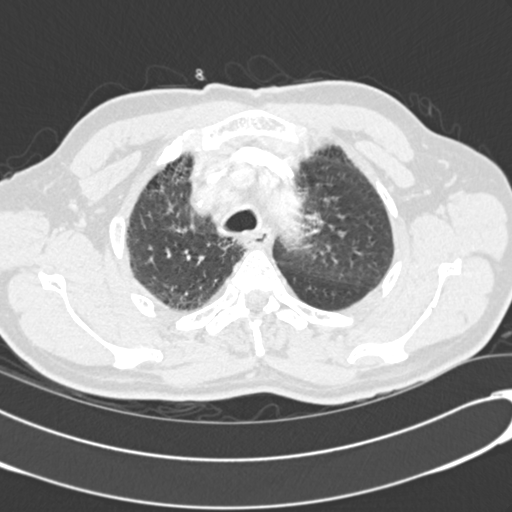
[im 193/227  mediastinal]
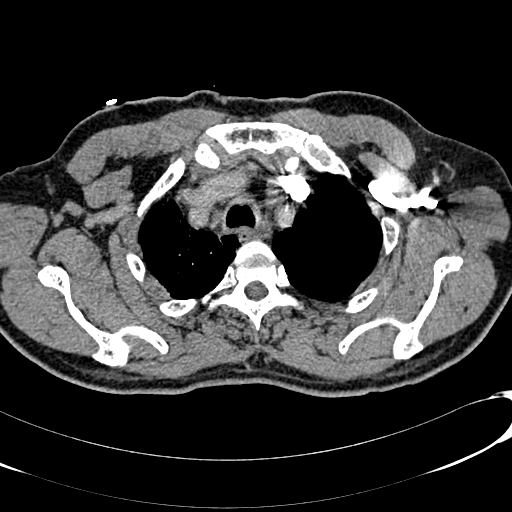
[im 204/227  lung]
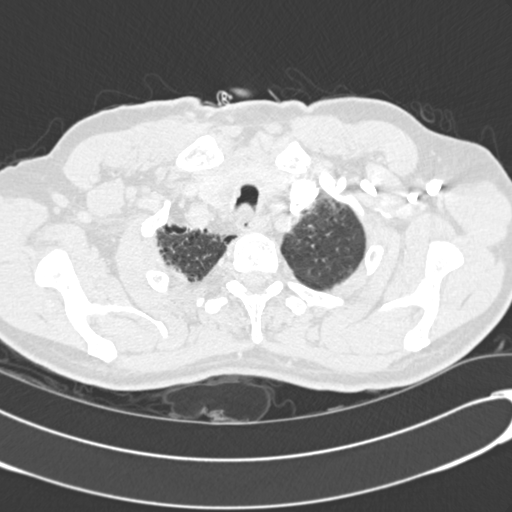
[im 215/227  mediastinal]
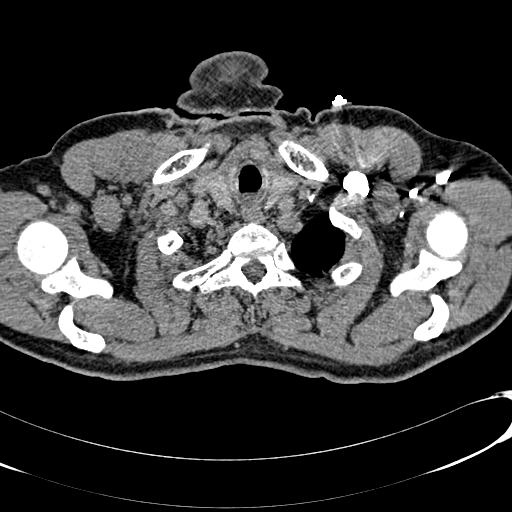

[Series 605: <mpr thick cor · coronal · 0.66mm/px · 1 of 110 slices shown]
[im 55/110  mediastinal]
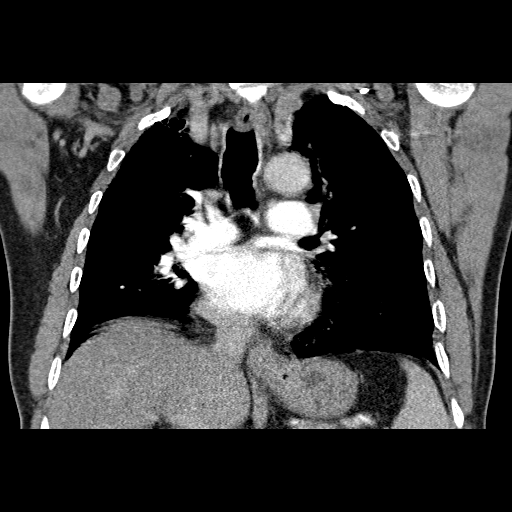

[19 of 36 positions shown; findings below may reference images not displayed]

FINDINGS: Exam is degraded by respiratory motion.  This limits
detection of small pulmonary embolus.  No large central pulmonary
embolus

Dilated tortuous thoracic aorta without dissection.  Ascending
thoracic aorta measures up to 3.8 x 4 cm.

Cardiomegaly.

Evaluation of the lung parenchyma is limited secondary to the
marked as before motion.  In addition to chronic changes, scattered
pulmonary vascular congestion/mild pneumonitis type changes may
also be present.

Slightly enlarged right paratracheal lymph nodes measure 1.6 x
cm (series 7 image 26).

Fatty infiltration of the liver.  Thickening of the lower
esophagus.  The etiology  is indeterminate.  Degenerative changes
thoracic spine with Schmorl's node deformities.

Review of the MIP images confirms the above findings.
IMPRESSION: Exam is degraded by respiratory motion.  This limits detection of
small pulmonary embolus.  No large central pulmonary embolus

Dilated tortuous thoracic aorta without dissection.  Ascending
thoracic aorta measures up to 3.8 x 4 cm.

Cardiomegaly.

Evaluation of the lung parenchyma is limited secondary to the
marked as before motion.  In addition to chronic changes, scattered
pulmonary vascular congestion/mild pneumonitis type changes may
also be present.

Slightly enlarged right paratracheal lymph nodes measure 1.6 x
cm (series 7 image 26).

Thickening of the lower esophagus.  Etiology is indeterminate.

## 2012-04-01 ENCOUNTER — Encounter (HOSPITAL_COMMUNITY): Payer: Self-pay | Admitting: Pharmacist

## 2012-04-12 ENCOUNTER — Encounter (HOSPITAL_COMMUNITY)
Admission: RE | Disposition: A | Discharge status: Home / Self Care | Payer: Self-pay | Source: Ambulatory Visit | Attending: Cardiology

## 2012-04-12 ENCOUNTER — Ambulatory Visit (HOSPITAL_COMMUNITY)
Admission: RE | Admit: 2012-04-12 | Discharge: 2012-04-12 | Disposition: A | Discharge status: Home / Self Care | Payer: Medicaid Other | Source: Ambulatory Visit | Attending: Cardiology | Admitting: Cardiology

## 2012-04-12 DIAGNOSIS — R079 Chest pain, unspecified: Secondary | ICD-10-CM | POA: Insufficient documentation

## 2012-04-12 DIAGNOSIS — I359 Nonrheumatic aortic valve disorder, unspecified: Secondary | ICD-10-CM | POA: Insufficient documentation

## 2012-04-12 HISTORY — PX: LEFT AND RIGHT HEART CATHETERIZATION WITH CORONARY ANGIOGRAM: SHX5449

## 2012-04-12 HISTORY — PX: ABDOMINAL AORTAGRAM: SHX5454

## 2012-04-12 LAB — POCT I-STAT 3, VENOUS BLOOD GAS (G3P V)
O2 Saturation: 54 %
TCO2: 28 mmol/L (ref 0–100)
pCO2, Ven: 54.7 mmHg — ABNORMAL HIGH (ref 45.0–50.0)
pH, Ven: 7.283 (ref 7.250–7.300)
pO2, Ven: 33 mmHg (ref 30.0–45.0)

## 2012-04-12 SURGERY — LEFT AND RIGHT HEART CATHETERIZATION WITH CORONARY ANGIOGRAM
Anesthesia: LOCAL

## 2012-04-12 MED ORDER — LIDOCAINE HCL (PF) 1 % IJ SOLN
INTRAMUSCULAR | Status: AC
  Filled 2012-04-12: qty 30

## 2012-04-12 MED ORDER — ASPIRIN 81 MG PO CHEW
324.0000 mg | CHEWABLE_TABLET | ORAL | Status: AC
Start: 2012-04-13 — End: 2012-04-12
  Administered 2012-04-12: 324 mg via ORAL
  Filled 2012-04-12: qty 4

## 2012-04-12 MED ORDER — SODIUM CHLORIDE 0.9 % IV SOLN: INTRAVENOUS | Status: AC

## 2012-04-12 MED ORDER — ACETAMINOPHEN 325 MG PO TABS: 650.0000 mg | ORAL_TABLET | ORAL | Status: DC | PRN

## 2012-04-12 MED ORDER — NITROGLYCERIN 0.2 MG/ML ON CALL CATH LAB
INTRAVENOUS | Status: AC
  Filled 2012-04-12: qty 1

## 2012-04-12 MED ORDER — FENTANYL CITRATE 0.05 MG/ML IJ SOLN
INTRAMUSCULAR | Status: AC
  Filled 2012-04-12: qty 2

## 2012-04-12 MED ORDER — HEPARIN (PORCINE) IN NACL 2-0.9 UNIT/ML-% IJ SOLN
INTRAMUSCULAR | Status: AC
  Filled 2012-04-12: qty 1000

## 2012-04-12 MED ORDER — SODIUM CHLORIDE 0.9 % IJ SOLN: 3.0000 mL | Freq: Two times a day (BID) | INTRAMUSCULAR | Status: DC

## 2012-04-12 MED ORDER — MIDAZOLAM HCL 2 MG/2ML IJ SOLN
INTRAMUSCULAR | Status: AC
  Filled 2012-04-12: qty 2

## 2012-04-12 MED ORDER — SODIUM CHLORIDE 0.9 % IV SOLN
INTRAVENOUS | Status: DC
  Administered 2012-04-12: 08:00:00 via INTRAVENOUS

## 2012-04-12 MED ORDER — HEPARIN (PORCINE) IN NACL 2-0.9 UNIT/ML-% IJ SOLN
INTRAMUSCULAR | Status: AC
  Filled 2012-04-12: qty 500

## 2012-04-12 MED ORDER — SODIUM CHLORIDE 0.9 % IJ SOLN: 3.0000 mL | INTRAMUSCULAR | Status: DC | PRN

## 2012-04-12 MED ORDER — ONDANSETRON HCL 4 MG/2ML IJ SOLN: 4.0000 mg | Freq: Four times a day (QID) | INTRAMUSCULAR | Status: DC | PRN

## 2012-04-12 MED ORDER — SODIUM CHLORIDE 0.9 % IV SOLN: 250.0000 mL | INTRAVENOUS | Status: DC | PRN

## 2012-04-12 NOTE — H&P (Signed)
  Please see paper chart  

## 2012-04-12 NOTE — Interval H&P Note (Signed)
History and Physical Interval Note:  04/12/2012 9:01 AM  Ryan Burke  has presented today for surgery, with the diagnosis of Chest pain/Claudication  The various methods of treatment have been discussed with the patient and family. After consideration of risks, benefits and other options for treatment, the patient has consented to  Procedure(s) (LRB) with comments: LEFT AND RIGHT HEART CATHETERIZATION WITH CORONARY ANGIOGRAM (N/A) ABDOMINAL AORTAGRAM (N/A), Ascending aortogram and possible angioplasty as a surgical intervention .  The patient's history has been reviewed, patient examined, no change in status, stable for surgery.  I have reviewed the patient's chart and labs.  Questions were answered to the patient's satisfaction.  Patient is aware 4th year medical student Ms. Raphael Gibney is present for observation.   Pamella Pert

## 2012-04-12 NOTE — CV Procedure (Addendum)
Procedures performed: Left heart catheterization, left ventricle gram, selective right and left coronary arteriogram, ascending aortogram. Right heart catheterization and calculation of cardiac output and cardiac index by Fick. Right femoral artery and vein access was utilized for performing the procedure.   Indication: Patient is a 72 year-old male with history of hypertension, hyperlipidemia,  who presents with chest pain, shortness of breath and worsening dyspnea.Physical exam was consistent with moderate to severe AI. Patient is brought to the cardiac catheter lab to perform right heart catheterization to evaluate for pulmonary hypertension, calculation of cardiac output and cardiac index.   Procedural data:  RA pressure 10/6  Mean 7mm mercury. .  RV pressure 32/10and Right ventricular EDP 11mm Hg. PA pressure 29/7 a mean of 13m mercury. PA saturation 54% Pulmonary capillary wedge 18** with a mean of 17 Hg. Aortic saturation 90% Cardiac output was 3.26h cardiac index of 1.88 Fick.   Angiographic data:  RCA: Normal and dominant Left main coronary artery: Normal LAD: Smooth and normal. Circumflex coronary artery: Normal LV gram mild global hypokinesis, ejection fraction 45-50%.  Ascending aortogram: Severe aortic regurgitation evident. No evidence of ascending aortic aneurysm.   Impression:   Normal right heart catheterizaton with decreased cardiac output and cardiac index.  No suggestion of intracardiac shunting with normal Qp:Qs ratio. Coronary arteriogram revealing normal coronary arteries, right dominant circulation Severe aortic regurgitation.  Recommendation: I discussed the findings of the  catheterization with the patient's daughter-in-law. Her explained to them that he will probably need a request replacement. His left systolic function is in the lower limit of normal to mildly reduced at around 45-50%. I suspect the chest pain which is exertional is probably related to aortic  regurgitation.  Technique of the procedure : A 5 F right femoral artery access sheath and a 7 French  sheath introduced into right femoral  vein for access under fluroscopic guidance.  A  ** French Swan-Ganz catheter was advanced with balloon inflated  under fluoroscopic guidance into first the right atrium followed by the right ventricle and into the pulmonary artery to pulmonary artery wedge position. Hemodynamics were obtained in a locations.  After hemodynamics were completed, samples were taken for SaO2% measurement to be used in Fick  Calculation of cardiac output and cardiac index. The catheter was then pulled back the balloon down and then completely out of the body. Hemostasis obtained by manual pressure over the access site. A 5 French multipurpose B2 cath was advanced into the distal aorta and then and left epidurography was performed both in LAO and RAO projection. The same cath was then utilized to perform selective right left coronary arteriogram. The catheter was then pulled out of the body and a 5 French straight pigtail catheter was utilized to perform ascending aortogram in the LAO projection by injecting 40 cc of contrast at 20 cc per second. The catheter was then pulled outside body over J-wire. Right femoral arteriogram was performed to evaluate the arterial access site only. Manual pressure was held for achieving hemostasis.

## 2012-04-26 DIAGNOSIS — I340 Nonrheumatic mitral (valve) insufficiency: Secondary | ICD-10-CM | POA: Insufficient documentation

## 2012-04-26 DIAGNOSIS — E1159 Type 2 diabetes mellitus with other circulatory complications: Secondary | ICD-10-CM | POA: Insufficient documentation

## 2012-04-26 DIAGNOSIS — E1169 Type 2 diabetes mellitus with other specified complication: Secondary | ICD-10-CM | POA: Insufficient documentation

## 2012-04-26 DIAGNOSIS — E785 Hyperlipidemia, unspecified: Secondary | ICD-10-CM | POA: Insufficient documentation

## 2012-04-26 DIAGNOSIS — I152 Hypertension secondary to endocrine disorders: Secondary | ICD-10-CM | POA: Insufficient documentation

## 2012-04-26 DIAGNOSIS — I351 Nonrheumatic aortic (valve) insufficiency: Secondary | ICD-10-CM | POA: Insufficient documentation

## 2012-04-26 DIAGNOSIS — I10 Essential (primary) hypertension: Secondary | ICD-10-CM | POA: Insufficient documentation

## 2012-04-27 ENCOUNTER — Encounter: Payer: Self-pay | Admitting: Surgery

## 2012-04-27 ENCOUNTER — Institutional Professional Consult (permissible substitution) (INDEPENDENT_AMBULATORY_CARE_PROVIDER_SITE_OTHER): Payer: Medicaid Other | Admitting: Surgery

## 2012-04-27 ENCOUNTER — Other Ambulatory Visit: Payer: Self-pay | Admitting: *Deleted

## 2012-04-27 ENCOUNTER — Encounter: Payer: Medicaid Other | Admitting: Surgery

## 2012-04-27 VITALS — BP 164/72 | HR 78 | Resp 20 | Ht 64.0 in | Wt 150.0 lb

## 2012-04-27 DIAGNOSIS — I340 Nonrheumatic mitral (valve) insufficiency: Secondary | ICD-10-CM

## 2012-04-27 DIAGNOSIS — I359 Nonrheumatic aortic valve disorder, unspecified: Secondary | ICD-10-CM

## 2012-04-27 DIAGNOSIS — I351 Nonrheumatic aortic (valve) insufficiency: Secondary | ICD-10-CM

## 2012-04-27 DIAGNOSIS — I059 Rheumatic mitral valve disease, unspecified: Secondary | ICD-10-CM

## 2012-04-27 NOTE — Progress Notes (Signed)
                  [address]            [place] [address]          [phone]      PCP is AVBUERE,EDWIN A, MD  Referring Provider is Ganji, Jagadeesh R, MD  Chief Complaint   Patient presents with   .  Shortness of Breath     Referral from Dr Ganji for surgical eval for severe aortic regurgitation, Cardiac Cath 04/13/11, Echo 03/09/2012   HPI:  The patient is a 72-year-old gentleman from [place] who has been in the [place] for approximately 4-5 years and is here with his son and grandson today who speak English well and can interpret for him. He was seen by Ganji with a one to two-year history of intermittent chest discomfort as well as shortness of breath with exertion and orthopnea. A Lexiscan stress test on 03/11/2012 showed resting inferior and lateral ST depression. There was very mild lateral ischemia and an ejection fraction of 44%. He underwent cardiac catheterization on 04/12/2012 which showed no coronary disease and a left ventricular ejection fraction of 45-50% with severe aortic insufficiency. There was no aortic aneurysm. Right heart pressures were normal with decreased cardiac index of 1.88. A 2-D echocardiogram from 03/09/2012 had shown moderate aortic insufficiency and mild mitral and tricuspid regurgitation with a left ventricular ejection fraction of 56%.  Past Medical History   Diagnosis  Date   .  Hypertension    .  Hyperlipidemia    .  Aortic regurgitation    .  Mitral regurgitation    .  Chest pain at rest    .  SOB (shortness of breath)    .  GERD (gastroesophageal reflux disease)     Past Surgical History   Procedure  Date   .  Cataract extraction, bilateral     Family History   Problem  Relation  Age of Onset   .  Heart disease  Sister    Social History  History   Substance Use Topics   .  Smoking status:  Former Smoker     Types:  Cigarettes     Quit date:  03/30/2001   .  Smokeless tobacco:  Never Used   .  Alcohol Use:  Yes     Current Outpatient Prescriptions   Medication  Sig  Dispense  Refill   .  albuterol (PROVENTIL HFA;VENTOLIN HFA) 108 (90 BASE) MCG/ACT inhaler  Inhale 2 puffs into the lungs every 6 (six) hours as needed. For shortness of breath     .  aspirin EC 81 MG tablet  Take 81 mg by mouth daily.     .  doxazosin (CARDURA) 4 MG tablet  Take 4 mg by mouth daily.     .  lisinopril (PRINIVIL,ZESTRIL) 20 MG tablet  Take 20 mg by mouth daily.     .  nitroGLYCERIN (NITROSTAT) 0.4 MG SL tablet  Place 0.4 mg under the tongue every 5 (five) minutes x 3 doses as needed. For chest pain     .  omeprazole (PRILOSEC) 20 MG capsule  Take 20 mg by mouth daily.     .  pravastatin (PRAVACHOL) 20 MG tablet  Take 20 mg by mouth every evening.     No Known Allergies  Review of Systems  Constitutional: Positive for activity change and fatigue.  HENT: Negative.  Eyes:   Negative.  Respiratory: Positive for shortness of breath.  With exertion. He has orthopnea.  Cardiovascular: Positive for chest pain. Negative for leg swelling.  Gastrointestinal: Negative.  Genitourinary: Negative.  Musculoskeletal: Negative.  Neurological: Negative.  Hematological: Negative.  Psychiatric/Behavioral: Negative.  BP 164/72  Pulse 78  Resp 20  Ht 5' 4" (1.626 m)  Wt 150 lb (68.04 kg)  BMI 25.75 kg/m2  SpO2 97%  Physical Exam  Constitutional: He is oriented to person, place, and time. He appears well-developed and well-nourished. No distress.  HENT:  Head: Normocephalic and atraumatic.  Mouth/Throat: Oropharynx is clear and moist.  Eyes: Conjunctivae normal and EOM are normal. Pupils are equal, round, and reactive to light.  Neck: Normal range of motion. Neck supple. No JVD present. No thyromegaly present.  Cardiovascular: Normal rate and regular rhythm.  3/6 diastolic murmur at apex. 2/6 systolic murmur left of sternum.  Pulmonary/Chest: Effort normal and breath sounds normal. No respiratory distress. He has no rales.   Abdominal: Soft. Bowel sounds are normal. He exhibits no distension and no mass. There is no tenderness.  Musculoskeletal: Normal range of motion. He exhibits no edema.  Lymphadenopathy:  He has no cervical adenopathy.  Neurological: He is alert and oriented to person, place, and time. He has normal strength. No cranial nerve deficit or sensory deficit.  Skin: Skin is warm and dry.  Psychiatric: He has a normal mood and affect.  Diagnostic Tests:  Cardiac Cath:  Procedural data:  RA pressure 10/6 Mean 7mm mercury. .  RV pressure 32/10and Right ventricular EDP 11mm Hg.  PA pressure 29/7 a mean of 16m mercury. PA saturation 54%  Pulmonary capillary wedge 18** with a mean of 17 Hg. Aortic saturation 90%  Cardiac output was 3.26h cardiac index of 1.88 Fick.  Angiographic data:  RCA: Normal and dominant  Left main coronary artery: Normal  LAD: Smooth and normal.  Circumflex coronary artery: Normal  LV gram mild global hypokinesis, ejection fraction 45-50%.  Ascending aortogram: Severe aortic regurgitation evident. No evidence of ascending aortic aneurysm.  Impression: Normal right heart catheterizaton with decreased cardiac output and cardiac index. No suggestion of intracardiac shunting with normal Qp:Qs ratio.  Coronary arteriogram revealing normal coronary arteries, right dominant circulation  Severe aortic regurgitation.  Recommendation: I discussed the findings of the catheterization with the patient's daughter-in-law. Her explained to them that he will probably need a request replacement. His left systolic function is in the lower limit of normal to mildly reduced at around 45-50%. I suspect the chest pain which is exertional is probably related to aortic regurgitation.  Impression/Plan:  He has severe aortic insufficiency with symptoms of exertional dyspnea, orthopnea, and chest pain with exertion. I agree that aortic valve replacement is the best treatment to improve his symptoms and  quality of life and prevent progressive left ventricular deterioration. I'll plan to use a tissue valve given his age. I discussed the operative procedure with the patient and family including alternatives, benefits and risks; including but not limited to bleeding, blood transfusion, infection, stroke, myocardial infarction, graft failure, heart block requiring a permanent pacemaker, organ dysfunction, and death. Ryan Burke understands and agrees to proceed. We will schedule surgery for Wed 05/04/12.     

## 2012-04-29 ENCOUNTER — Encounter (HOSPITAL_COMMUNITY): Payer: Self-pay | Admitting: Pharmacy Technician

## 2012-05-02 ENCOUNTER — Encounter (HOSPITAL_COMMUNITY): Payer: Self-pay

## 2012-05-02 ENCOUNTER — Encounter (HOSPITAL_COMMUNITY)
Admission: RE | Admit: 2012-05-02 | Discharge: 2012-05-02 | Disposition: A | Discharge status: Home / Self Care | Payer: Medicaid Other | Source: Ambulatory Visit | Attending: Surgery | Admitting: Surgery

## 2012-05-02 ENCOUNTER — Ambulatory Visit (HOSPITAL_COMMUNITY)
Admission: RE | Admit: 2012-05-02 | Discharge: 2012-05-02 | Disposition: A | Discharge status: Home / Self Care | Payer: Medicaid Other | Source: Ambulatory Visit | Attending: Surgery | Admitting: Surgery

## 2012-05-02 ENCOUNTER — Inpatient Hospital Stay (HOSPITAL_COMMUNITY)
Admission: RE | Admit: 2012-05-02 | Discharge: 2012-05-02 | Disposition: A | Discharge status: Home / Self Care | Payer: Medicaid Other | Source: Ambulatory Visit | Attending: Surgery | Admitting: Surgery

## 2012-05-02 VITALS — BP 164/74 | HR 74 | Temp 98.3°F | Resp 20 | Ht 63.0 in | Wt 160.3 lb

## 2012-05-02 DIAGNOSIS — J8410 Pulmonary fibrosis, unspecified: Secondary | ICD-10-CM | POA: Insufficient documentation

## 2012-05-02 DIAGNOSIS — I080 Rheumatic disorders of both mitral and aortic valves: Secondary | ICD-10-CM | POA: Insufficient documentation

## 2012-05-02 DIAGNOSIS — I351 Nonrheumatic aortic (valve) insufficiency: Secondary | ICD-10-CM

## 2012-05-02 DIAGNOSIS — J4489 Other specified chronic obstructive pulmonary disease: Secondary | ICD-10-CM | POA: Insufficient documentation

## 2012-05-02 DIAGNOSIS — I359 Nonrheumatic aortic valve disorder, unspecified: Secondary | ICD-10-CM

## 2012-05-02 DIAGNOSIS — Z01812 Encounter for preprocedural laboratory examination: Secondary | ICD-10-CM | POA: Insufficient documentation

## 2012-05-02 DIAGNOSIS — Z01810 Encounter for preprocedural cardiovascular examination: Secondary | ICD-10-CM

## 2012-05-02 DIAGNOSIS — Z01811 Encounter for preprocedural respiratory examination: Secondary | ICD-10-CM | POA: Insufficient documentation

## 2012-05-02 DIAGNOSIS — Z01818 Encounter for other preprocedural examination: Secondary | ICD-10-CM | POA: Insufficient documentation

## 2012-05-02 DIAGNOSIS — J449 Chronic obstructive pulmonary disease, unspecified: Secondary | ICD-10-CM | POA: Insufficient documentation

## 2012-05-02 DIAGNOSIS — I340 Nonrheumatic mitral (valve) insufficiency: Secondary | ICD-10-CM

## 2012-05-02 HISTORY — DX: Unspecified asthma, uncomplicated: J45.909

## 2012-05-02 HISTORY — DX: Cardiac murmur, unspecified: R01.1

## 2012-05-02 LAB — CBC
HCT: 39.1 % (ref 39.0–52.0)
Hemoglobin: 13.9 g/dL (ref 13.0–17.0)
MCHC: 35.5 g/dL (ref 30.0–36.0)
RDW: 13.1 % (ref 11.5–15.5)
WBC: 8.4 10*3/uL (ref 4.0–10.5)

## 2012-05-02 LAB — BLOOD GAS, ARTERIAL
Bicarbonate: 23.4 mEq/L (ref 20.0–24.0)
FIO2: 0.21 %
Patient temperature: 98.6
TCO2: 24.4 mmol/L (ref 0–100)
pCO2 arterial: 34.2 mmHg — ABNORMAL LOW (ref 35.0–45.0)
pH, Arterial: 7.449 (ref 7.350–7.450)

## 2012-05-02 LAB — COMPREHENSIVE METABOLIC PANEL
ALT: 22 U/L (ref 0–53)
AST: 26 U/L (ref 0–37)
Albumin: 3.3 g/dL — ABNORMAL LOW (ref 3.5–5.2)
Alkaline Phosphatase: 109 U/L (ref 39–117)
Chloride: 107 mEq/L (ref 96–112)
Potassium: 4 mEq/L (ref 3.5–5.1)
Sodium: 139 mEq/L (ref 135–145)
Total Bilirubin: 1 mg/dL (ref 0.3–1.2)
Total Protein: 6.7 g/dL (ref 6.0–8.3)

## 2012-05-02 LAB — PULMONARY FUNCTION TEST

## 2012-05-02 LAB — URINALYSIS, ROUTINE W REFLEX MICROSCOPIC
Bilirubin Urine: NEGATIVE
Glucose, UA: NEGATIVE mg/dL
Ketones, ur: NEGATIVE mg/dL
Leukocytes, UA: NEGATIVE
Nitrite: NEGATIVE
Specific Gravity, Urine: 1.021 (ref 1.005–1.030)
pH: 5.5 (ref 5.0–8.0)

## 2012-05-02 LAB — URINE MICROSCOPIC-ADD ON

## 2012-05-02 LAB — ABO/RH: ABO/RH(D): A POS

## 2012-05-02 LAB — SURGICAL PCR SCREEN: Staphylococcus aureus: NEGATIVE

## 2012-05-02 LAB — PROTIME-INR: INR: 0.97 (ref 0.00–1.49)

## 2012-05-02 MED ORDER — ALBUTEROL SULFATE (5 MG/ML) 0.5% IN NEBU
2.5000 mg | INHALATION_SOLUTION | Freq: Once | RESPIRATORY_TRACT | Status: AC
  Administered 2012-05-02: 2.5 mg via RESPIRATORY_TRACT
  Filled 2012-05-02: qty 0.5

## 2012-05-02 NOTE — Progress Notes (Signed)
VASCULAR LAB PRELIMINARY  PRELIMINARY  PRELIMINARY  PRELIMINARY  Pre-op Cardiac Surgery  Carotid Findings:  Bilateral:  No evidence of hemodynamically significant internal carotid artery stenosis.   Vertebral artery flow is antegrade.      Upper Extremity Right Left  Brachial Pressures 185 T 180 T  Radial Waveforms M T  Ulnar Waveforms T T  Palmar Arch (Allen's Test) * *   Findings:  *Doppler waveforms remain normal with ulnar and radial compressions bilaterally.    Gabriele Zwilling, RVT 05/02/2012, 12:49 PM

## 2012-05-02 NOTE — Progress Notes (Signed)
Paged ryan but no response.  Patient interpretor stated patient req'd book and ic  inst already Cath 04/12/12, echo 5/12, ekg 04/12/12 note dr Jacinto Halim 04/12/12 in epic Preliminary dopplers in notes pft's done prior to preadmit. Not ready yet

## 2012-05-02 NOTE — Pre-Procedure Instructions (Addendum)
TREYLAN MCCLINTOCK  05/02/2012   Your procedure is scheduled on: 05/04/12  Report to [facility] [phone].  Call this number if you have problems the morning of surgery: [phone]   Remember:   Do not eat food or drink liquids after midnight.   Take these medicines the morning of surgery with A SIP OF WATER  inhaler,doxazosin,omeprazole,    Do not wear jewelry, make-up or nail polish.  Do not wear lotions, powders, or perfumes. You maynot wear deodorant.  Do not shave 48 hours prior to surgery. Men may shave face and neck.  Do not bring valuables to the hospital.  Contacts, dentures or bridgework may not be worn into surgery.  Leave suitcase in the car. After surgery it may be brought to your room.  For patients admitted to the hospital, checkout time is 11:00 AM the day of  discharge.   Patients discharged the day of surgery will not be allowed to drive  home.  Name and phone number of your driver: Mellody Drown son [phone]  Special Instructions: Incentive Spirometry - Practice and bring it with you on the day of surgery. Shower using CHG 2 nights before surgery and the night before surgery.  If you shower the day of surgery use CHG.  Use special wash - you have one bottle of CHG for all showers.  You should use approximately 1/3 of the bottle for each shower.   Please read over the following fact sheets that you were given: Pain Booklet, Coughing and Deep Breathing, Blood Transfusion Information, Open Heart Packet, MRSA Information and Surgical Site Infection Prevention

## 2012-05-02 NOTE — Progress Notes (Signed)
05/02/12 1554  OBSTRUCTIVE SLEEP APNEA  Score 4 or greater  Results sent to PCP

## 2012-05-03 MED ORDER — VANCOMYCIN HCL 10 G IV SOLR
1250.0000 mg | INTRAVENOUS | Status: AC
  Administered 2012-05-04: 1250 mg via INTRAVENOUS
  Filled 2012-05-03 (×2): qty 1250

## 2012-05-03 MED ORDER — DEXTROSE 5 % IV SOLN
1.5000 g | INTRAVENOUS | Status: AC
  Administered 2012-05-04: 1.5 g via INTRAVENOUS
  Administered 2012-05-04: .75 g via INTRAVENOUS
  Filled 2012-05-03 (×2): qty 1.5

## 2012-05-03 MED ORDER — MAGNESIUM SULFATE 50 % IJ SOLN
40.0000 meq | INTRAMUSCULAR | Status: DC
  Filled 2012-05-03: qty 10

## 2012-05-03 MED ORDER — SODIUM CHLORIDE 0.9 % IV SOLN
INTRAVENOUS | Status: AC
  Administered 2012-05-04: 14 mL/h via INTRAVENOUS
  Filled 2012-05-03: qty 40

## 2012-05-03 MED ORDER — VERAPAMIL HCL 2.5 MG/ML IV SOLN
INTRAVENOUS | Status: DC
  Filled 2012-05-03: qty 2.5

## 2012-05-03 MED ORDER — DOPAMINE-DEXTROSE 3.2-5 MG/ML-% IV SOLN
2.0000 ug/kg/min | INTRAVENOUS | Status: DC
  Filled 2012-05-03: qty 250

## 2012-05-03 MED ORDER — NITROGLYCERIN IN D5W 200-5 MCG/ML-% IV SOLN
2.0000 ug/min | INTRAVENOUS | Status: DC
  Administered 2012-05-04: 5 ug/min via INTRAVENOUS
  Filled 2012-05-03: qty 250

## 2012-05-03 MED ORDER — DEXMEDETOMIDINE HCL IN NACL 400 MCG/100ML IV SOLN
0.1000 ug/kg/h | INTRAVENOUS | Status: AC
  Administered 2012-05-04: 0.2 ug/kg/h via INTRAVENOUS
  Filled 2012-05-03: qty 100

## 2012-05-03 MED ORDER — EPINEPHRINE HCL 1 MG/ML IJ SOLN
0.5000 ug/min | INTRAVENOUS | Status: DC
  Filled 2012-05-03: qty 4

## 2012-05-03 MED ORDER — SODIUM CHLORIDE 0.9 % IV SOLN
INTRAVENOUS | Status: AC
  Administered 2012-05-04: 1 [IU]/h via INTRAVENOUS
  Filled 2012-05-03: qty 1

## 2012-05-03 MED ORDER — DEXTROSE 5 % IV SOLN
750.0000 mg | INTRAVENOUS | Status: DC
  Filled 2012-05-03: qty 750

## 2012-05-03 MED ORDER — POTASSIUM CHLORIDE 2 MEQ/ML IV SOLN
80.0000 meq | INTRAVENOUS | Status: DC
  Filled 2012-05-03: qty 40

## 2012-05-03 MED ORDER — PHENYLEPHRINE HCL 10 MG/ML IJ SOLN
30.0000 ug/min | INTRAVENOUS | Status: AC
  Administered 2012-05-04: 50 ug/min via INTRAVENOUS
  Filled 2012-05-03: qty 2

## 2012-05-04 ENCOUNTER — Encounter (HOSPITAL_COMMUNITY)
Admission: RE | Disposition: A | Discharge status: Home / Self Care | Payer: Self-pay | Source: Ambulatory Visit | Attending: Surgery

## 2012-05-04 ENCOUNTER — Inpatient Hospital Stay (HOSPITAL_COMMUNITY)
Admission: RE | Admit: 2012-05-04 | Discharge: 2012-05-10 | DRG: 220 | Disposition: A | Discharge status: Home / Self Care | Payer: Medicaid Other | Source: Ambulatory Visit | Attending: Surgery | Admitting: Surgery

## 2012-05-04 ENCOUNTER — Encounter (HOSPITAL_COMMUNITY): Payer: Medicaid Other

## 2012-05-04 ENCOUNTER — Inpatient Hospital Stay (HOSPITAL_COMMUNITY): Payer: Medicaid Other | Admitting: Anesthesiology

## 2012-05-04 ENCOUNTER — Encounter (HOSPITAL_COMMUNITY): Payer: Self-pay | Admitting: Anesthesiology

## 2012-05-04 ENCOUNTER — Inpatient Hospital Stay (HOSPITAL_COMMUNITY): Payer: Medicaid Other

## 2012-05-04 DIAGNOSIS — I359 Nonrheumatic aortic valve disorder, unspecified: Principal | ICD-10-CM | POA: Diagnosis present

## 2012-05-04 DIAGNOSIS — I351 Nonrheumatic aortic (valve) insufficiency: Secondary | ICD-10-CM

## 2012-05-04 DIAGNOSIS — E519 Thiamine deficiency, unspecified: Secondary | ICD-10-CM | POA: Diagnosis not present

## 2012-05-04 DIAGNOSIS — I10 Essential (primary) hypertension: Secondary | ICD-10-CM | POA: Diagnosis present

## 2012-05-04 DIAGNOSIS — Z952 Presence of prosthetic heart valve: Secondary | ICD-10-CM

## 2012-05-04 DIAGNOSIS — E8779 Other fluid overload: Secondary | ICD-10-CM | POA: Diagnosis not present

## 2012-05-04 DIAGNOSIS — Z79899 Other long term (current) drug therapy: Secondary | ICD-10-CM

## 2012-05-04 DIAGNOSIS — E785 Hyperlipidemia, unspecified: Secondary | ICD-10-CM | POA: Diagnosis present

## 2012-05-04 DIAGNOSIS — I4891 Unspecified atrial fibrillation: Secondary | ICD-10-CM | POA: Diagnosis not present

## 2012-05-04 DIAGNOSIS — K219 Gastro-esophageal reflux disease without esophagitis: Secondary | ICD-10-CM | POA: Diagnosis present

## 2012-05-04 DIAGNOSIS — N179 Acute kidney failure, unspecified: Secondary | ICD-10-CM | POA: Diagnosis not present

## 2012-05-04 DIAGNOSIS — Z7982 Long term (current) use of aspirin: Secondary | ICD-10-CM

## 2012-05-04 HISTORY — PX: AORTIC VALVE REPLACEMENT: SHX41

## 2012-05-04 HISTORY — PX: INTRAOPERATIVE TRANSESOPHAGEAL ECHOCARDIOGRAM: SHX5062

## 2012-05-04 LAB — CREATININE, SERUM
Creatinine, Ser: 1.35 mg/dL (ref 0.50–1.35)
GFR calc Af Amer: 60 mL/min — ABNORMAL LOW (ref >90)
GFR calc non Af Amer: 52 mL/min — ABNORMAL LOW (ref >90)

## 2012-05-04 LAB — POCT I-STAT 3, ART BLOOD GAS (G3+)
Acid-base deficit: 1 mmol/L (ref 0.0–2.0)
Acid-base deficit: 2 mmol/L (ref 0.0–2.0)
Bicarbonate: 25.2 mEq/L — ABNORMAL HIGH (ref 20.0–24.0)
Bicarbonate: 25.2 mEq/L — ABNORMAL HIGH (ref 20.0–24.0)
O2 Saturation: 97 %
O2 Saturation: 96 %
Patient temperature: 37.3
Patient temperature: 38.2
TCO2: 27 mmol/L (ref 0–100)
TCO2: 26 mmol/L (ref 0–100)
TCO2: 26 mmol/L (ref 0–100)
pCO2 arterial: 46.5 mmHg — ABNORMAL HIGH (ref 35.0–45.0)
pH, Arterial: 7.335 — ABNORMAL LOW (ref 7.350–7.450)
pH, Arterial: 7.336 — ABNORMAL LOW (ref 7.350–7.450)
pH, Arterial: 7.318 — ABNORMAL LOW (ref 7.350–7.450)
pH, Arterial: 7.298 — ABNORMAL LOW (ref 7.350–7.450)
pO2, Arterial: 278 mmHg — ABNORMAL HIGH (ref 80.0–100.0)
pO2, Arterial: 72 mmHg — ABNORMAL LOW (ref 80.0–100.0)

## 2012-05-04 LAB — POCT I-STAT 4, (NA,K, GLUC, HGB,HCT)
Glucose, Bld: 91 mg/dL (ref 70–99)
Glucose, Bld: 86 mg/dL (ref 70–99)
Glucose, Bld: 84 mg/dL (ref 70–99)
Glucose, Bld: 96 mg/dL (ref 70–99)
HCT: 23 % — ABNORMAL LOW (ref 39.0–52.0)
HCT: 30 % — ABNORMAL LOW (ref 39.0–52.0)
HCT: 26 % — ABNORMAL LOW (ref 39.0–52.0)
Hemoglobin: 10.2 g/dL — ABNORMAL LOW (ref 13.0–17.0)
Hemoglobin: 8.5 g/dL — ABNORMAL LOW (ref 13.0–17.0)
Hemoglobin: 8.8 g/dL — ABNORMAL LOW (ref 13.0–17.0)
Potassium: 4 mEq/L (ref 3.5–5.1)
Potassium: 3.7 mEq/L (ref 3.5–5.1)
Potassium: 4 mEq/L (ref 3.5–5.1)
Potassium: 5.3 mEq/L — ABNORMAL HIGH (ref 3.5–5.1)
Sodium: 137 mEq/L (ref 135–145)

## 2012-05-04 LAB — CBC
HCT: 29.3 % — ABNORMAL LOW (ref 39.0–52.0)
HCT: 32.4 % — ABNORMAL LOW (ref 39.0–52.0)
Hemoglobin: 10.4 g/dL — ABNORMAL LOW (ref 13.0–17.0)
Hemoglobin: 11.4 g/dL — ABNORMAL LOW (ref 13.0–17.0)
MCH: 30.4 pg (ref 26.0–34.0)
MCV: 85.7 fL (ref 78.0–100.0)
MCV: 85.9 fL (ref 78.0–100.0)
Platelets: 132 10*3/uL — ABNORMAL LOW (ref 150–400)
RBC: 3.42 MIL/uL — ABNORMAL LOW (ref 4.22–5.81)
RBC: 3.77 MIL/uL — ABNORMAL LOW (ref 4.22–5.81)
WBC: 9.3 10*3/uL (ref 4.0–10.5)

## 2012-05-04 LAB — PLATELET COUNT: Platelets: 89 10*3/uL — ABNORMAL LOW (ref 150–400)

## 2012-05-04 LAB — GLUCOSE, CAPILLARY
Glucose-Capillary: 129 mg/dL — ABNORMAL HIGH (ref 70–99)
Glucose-Capillary: 82 mg/dL (ref 70–99)
Glucose-Capillary: 94 mg/dL (ref 70–99)

## 2012-05-04 LAB — POCT I-STAT, CHEM 8
Calcium, Ion: 1.17 mmol/L (ref 1.13–1.30)
HCT: 30 % — ABNORMAL LOW (ref 39.0–52.0)
Hemoglobin: 10.2 g/dL — ABNORMAL LOW (ref 13.0–17.0)
TCO2: 26 mmol/L (ref 0–100)

## 2012-05-04 LAB — HEMOGLOBIN AND HEMATOCRIT, BLOOD: HCT: 25 % — ABNORMAL LOW (ref 39.0–52.0)

## 2012-05-04 SURGERY — REPLACEMENT, AORTIC VALVE, OPEN
Anesthesia: General | Site: Chest | Wound class: Clean

## 2012-05-04 MED ORDER — MORPHINE SULFATE 2 MG/ML IJ SOLN: 1.0000 mg | INTRAMUSCULAR | Status: AC | PRN

## 2012-05-04 MED ORDER — AMINOCAPROIC ACID 250 MG/ML IV SOLN
INTRAVENOUS | Status: DC | PRN
  Administered 2012-05-04: 5 g via INTRAVENOUS

## 2012-05-04 MED ORDER — ASPIRIN 81 MG PO CHEW: 324.0000 mg | CHEWABLE_TABLET | Freq: Every day | ORAL | Status: DC

## 2012-05-04 MED ORDER — LACTATED RINGERS IV SOLN
INTRAVENOUS | Status: DC
  Administered 2012-05-04: 14:00:00 via INTRAVENOUS

## 2012-05-04 MED ORDER — METOPROLOL TARTRATE 12.5 MG HALF TABLET
12.5000 mg | ORAL_TABLET | Freq: Two times a day (BID) | ORAL | Status: DC
  Administered 2012-05-06: 12.5 mg via ORAL
  Filled 2012-05-04 (×5): qty 1

## 2012-05-04 MED ORDER — THROMBIN 20000 UNITS EX SOLR
CUTANEOUS | Status: DC | PRN
  Administered 2012-05-04: 10:00:00 via TOPICAL

## 2012-05-04 MED ORDER — BISACODYL 10 MG RE SUPP: 10.0000 mg | Freq: Every day | RECTAL | Status: DC

## 2012-05-04 MED ORDER — LIDOCAINE HCL (CARDIAC) 20 MG/ML IV SOLN
INTRAVENOUS | Status: DC | PRN
  Administered 2012-05-04: 100 mg via INTRAVENOUS

## 2012-05-04 MED ORDER — BISACODYL 5 MG PO TBEC
10.0000 mg | DELAYED_RELEASE_TABLET | Freq: Every day | ORAL | Status: DC
  Administered 2012-05-05 – 2012-05-06 (×2): 10 mg via ORAL
  Filled 2012-05-04 (×2): qty 2

## 2012-05-04 MED ORDER — SIMVASTATIN 10 MG PO TABS
10.0000 mg | ORAL_TABLET | Freq: Every day | ORAL | Status: DC
  Administered 2012-05-06 – 2012-05-09 (×4): 10 mg via ORAL
  Filled 2012-05-04 (×7): qty 1

## 2012-05-04 MED ORDER — DOCUSATE SODIUM 100 MG PO CAPS
200.0000 mg | ORAL_CAPSULE | Freq: Every day | ORAL | Status: DC
  Administered 2012-05-05 – 2012-05-06 (×2): 200 mg via ORAL
  Filled 2012-05-04 (×2): qty 2

## 2012-05-04 MED ORDER — SODIUM CHLORIDE 0.9 % IV SOLN: 250.0000 mL | INTRAVENOUS | Status: DC | PRN

## 2012-05-04 MED ORDER — DEXMEDETOMIDINE HCL IN NACL 200 MCG/50ML IV SOLN: 0.1000 ug/kg/h | INTRAVENOUS | Status: DC

## 2012-05-04 MED ORDER — CHLORHEXIDINE GLUCONATE 4 % EX LIQD: 30.0000 mL | CUTANEOUS | Status: DC

## 2012-05-04 MED ORDER — SODIUM CHLORIDE 0.9 % IV SOLN: INTRAVENOUS | Status: DC

## 2012-05-04 MED ORDER — INSULIN ASPART 100 UNIT/ML ~~LOC~~ SOLN
0.0000 [IU] | SUBCUTANEOUS | Status: DC
  Administered 2012-05-05 (×2): 2 [IU] via SUBCUTANEOUS

## 2012-05-04 MED ORDER — INSULIN REGULAR BOLUS VIA INFUSION
0.0000 [IU] | Freq: Three times a day (TID) | INTRAVENOUS | Status: DC
  Filled 2012-05-04: qty 10

## 2012-05-04 MED ORDER — METOPROLOL TARTRATE 12.5 MG HALF TABLET
12.5000 mg | ORAL_TABLET | Freq: Once | ORAL | Status: AC
  Administered 2012-05-04: 12.5 mg via ORAL
  Filled 2012-05-04: qty 1

## 2012-05-04 MED ORDER — OXYCODONE HCL 5 MG PO TABS
5.0000 mg | ORAL_TABLET | ORAL | Status: DC | PRN
Start: 2012-05-04 — End: 2012-05-05
  Administered 2012-05-05 (×3): 10 mg via ORAL
  Filled 2012-05-04 (×3): qty 2

## 2012-05-04 MED ORDER — INSULIN ASPART 100 UNIT/ML ~~LOC~~ SOLN: 0.0000 [IU] | SUBCUTANEOUS | Status: AC

## 2012-05-04 MED ORDER — LACTATED RINGERS IV SOLN
INTRAVENOUS | Status: DC | PRN
  Administered 2012-05-04 (×2): via INTRAVENOUS

## 2012-05-04 MED ORDER — ROCURONIUM BROMIDE 100 MG/10ML IV SOLN
INTRAVENOUS | Status: DC | PRN
  Administered 2012-05-04 (×2): 50 mg via INTRAVENOUS

## 2012-05-04 MED ORDER — ASPIRIN EC 325 MG PO TBEC
325.0000 mg | DELAYED_RELEASE_TABLET | Freq: Every day | ORAL | Status: DC
  Administered 2012-05-05 – 2012-05-06 (×2): 325 mg via ORAL
  Filled 2012-05-04 (×2): qty 1

## 2012-05-04 MED ORDER — FENTANYL CITRATE 0.05 MG/ML IJ SOLN
INTRAMUSCULAR | Status: DC | PRN
  Administered 2012-05-04: 50 ug via INTRAVENOUS
  Administered 2012-05-04: 100 ug via INTRAVENOUS
  Administered 2012-05-04 (×2): 150 ug via INTRAVENOUS
  Administered 2012-05-04: 50 ug via INTRAVENOUS
  Administered 2012-05-04: 750 ug via INTRAVENOUS

## 2012-05-04 MED ORDER — SODIUM CHLORIDE 0.9 % IJ SOLN
3.0000 mL | Freq: Two times a day (BID) | INTRAMUSCULAR | Status: DC
  Administered 2012-05-05 – 2012-05-06 (×3): 3 mL via INTRAVENOUS

## 2012-05-04 MED ORDER — ACETAMINOPHEN 10 MG/ML IV SOLN
1000.0000 mg | Freq: Once | INTRAVENOUS | Status: AC
  Administered 2012-05-04: 1000 mg via INTRAVENOUS
  Filled 2012-05-04: qty 100

## 2012-05-04 MED ORDER — NITROGLYCERIN IN D5W 200-5 MCG/ML-% IV SOLN: 0.0000 ug/min | INTRAVENOUS | Status: DC

## 2012-05-04 MED ORDER — MIDAZOLAM HCL 2 MG/2ML IJ SOLN: 2.0000 mg | INTRAMUSCULAR | Status: DC | PRN

## 2012-05-04 MED ORDER — METOPROLOL TARTRATE 1 MG/ML IV SOLN: 2.5000 mg | INTRAVENOUS | Status: DC | PRN

## 2012-05-04 MED ORDER — HEPARIN SODIUM (PORCINE) 1000 UNIT/ML IJ SOLN
INTRAMUSCULAR | Status: DC | PRN
  Administered 2012-05-04: 20000 [IU] via INTRAVENOUS

## 2012-05-04 MED ORDER — PANTOPRAZOLE SODIUM 40 MG PO TBEC
40.0000 mg | DELAYED_RELEASE_TABLET | Freq: Every day | ORAL | Status: DC
  Administered 2012-05-06: 40 mg via ORAL
  Filled 2012-05-04: qty 1

## 2012-05-04 MED ORDER — VECURONIUM BROMIDE 10 MG IV SOLR
INTRAVENOUS | Status: DC | PRN
  Administered 2012-05-04: 5 mg via INTRAVENOUS

## 2012-05-04 MED ORDER — METOPROLOL TARTRATE 25 MG/10 ML ORAL SUSPENSION
12.5000 mg | Freq: Two times a day (BID) | ORAL | Status: DC
  Filled 2012-05-04 (×5): qty 5

## 2012-05-04 MED ORDER — MIDAZOLAM HCL 5 MG/5ML IJ SOLN
INTRAMUSCULAR | Status: DC | PRN
  Administered 2012-05-04 (×2): 1 mg via INTRAVENOUS
  Administered 2012-05-04 (×2): 3 mg via INTRAVENOUS

## 2012-05-04 MED ORDER — ARTIFICIAL TEARS OP OINT
TOPICAL_OINTMENT | OPHTHALMIC | Status: DC | PRN
  Administered 2012-05-04: 1 via OPHTHALMIC

## 2012-05-04 MED ORDER — PHENYLEPHRINE HCL 10 MG/ML IJ SOLN
0.0000 ug/min | INTRAVENOUS | Status: DC
  Administered 2012-05-04: 15 ug/min via INTRAVENOUS

## 2012-05-04 MED ORDER — ALBUMIN HUMAN 5 % IV SOLN: 250.0000 mL | INTRAVENOUS | Status: AC | PRN

## 2012-05-04 MED ORDER — LACTATED RINGERS IV SOLN
INTRAVENOUS | Status: DC | PRN
  Administered 2012-05-04: 08:00:00 via INTRAVENOUS

## 2012-05-04 MED ORDER — PROTAMINE SULFATE 10 MG/ML IV SOLN
INTRAVENOUS | Status: DC | PRN
  Administered 2012-05-04: 180 mg via INTRAVENOUS

## 2012-05-04 MED ORDER — VANCOMYCIN HCL IN DEXTROSE 1-5 GM/200ML-% IV SOLN
1000.0000 mg | Freq: Once | INTRAVENOUS | Status: AC
  Administered 2012-05-04: 1000 mg via INTRAVENOUS
  Filled 2012-05-04: qty 200

## 2012-05-04 MED ORDER — POTASSIUM CHLORIDE 10 MEQ/50ML IV SOLN: 10.0000 meq | INTRAVENOUS | Status: AC

## 2012-05-04 MED ORDER — PROPOFOL 10 MG/ML IV BOLUS
INTRAVENOUS | Status: DC | PRN
  Administered 2012-05-04: 50 mg via INTRAVENOUS

## 2012-05-04 MED ORDER — LACTATED RINGERS IV SOLN: 500.0000 mL | Freq: Once | INTRAVENOUS | Status: AC | PRN

## 2012-05-04 MED ORDER — ACETAMINOPHEN 500 MG PO TABS
1000.0000 mg | ORAL_TABLET | Freq: Four times a day (QID) | ORAL | Status: DC
  Administered 2012-05-05 – 2012-05-06 (×5): 1000 mg via ORAL
  Filled 2012-05-04 (×10): qty 2

## 2012-05-04 MED ORDER — ONDANSETRON HCL 4 MG/2ML IJ SOLN
4.0000 mg | Freq: Four times a day (QID) | INTRAMUSCULAR | Status: DC | PRN
  Administered 2012-05-05: 4 mg via INTRAVENOUS
  Filled 2012-05-04: qty 2

## 2012-05-04 MED ORDER — SODIUM CHLORIDE 0.9 % IJ SOLN
3.0000 mL | INTRAMUSCULAR | Status: DC | PRN
  Administered 2012-05-05: 3 mL via INTRAVENOUS

## 2012-05-04 MED ORDER — SODIUM CHLORIDE 0.9 % IV SOLN
INTRAVENOUS | Status: DC
  Administered 2012-05-04: 0.4 [IU]/h via INTRAVENOUS

## 2012-05-04 MED ORDER — DEXTROSE 5 % IV SOLN
1.5000 g | Freq: Two times a day (BID) | INTRAVENOUS | Status: AC
  Administered 2012-05-04 – 2012-05-06 (×4): 1.5 g via INTRAVENOUS
  Filled 2012-05-04 (×4): qty 1.5

## 2012-05-04 MED ORDER — MAGNESIUM SULFATE 40 MG/ML IJ SOLN
4.0000 g | Freq: Once | INTRAMUSCULAR | Status: AC
  Administered 2012-05-04: 4 g via INTRAVENOUS

## 2012-05-04 MED ORDER — THROMBIN 20000 UNITS EX SOLR
CUTANEOUS | Status: AC
  Filled 2012-05-04: qty 20000

## 2012-05-04 MED ORDER — FAMOTIDINE IN NACL 20-0.9 MG/50ML-% IV SOLN
20.0000 mg | Freq: Two times a day (BID) | INTRAVENOUS | Status: DC
  Administered 2012-05-04: 20 mg via INTRAVENOUS

## 2012-05-04 MED ORDER — HEMOSTATIC AGENTS (NO CHARGE) OPTIME
TOPICAL | Status: DC | PRN
  Administered 2012-05-04: 1 via TOPICAL

## 2012-05-04 MED ORDER — THROMBIN 20000 UNITS EX SOLR
CUTANEOUS | Status: DC | PRN
  Administered 2012-05-04 (×3): via TOPICAL

## 2012-05-04 MED ORDER — MAGNESIUM SULFATE 40 MG/ML IJ SOLN
INTRAMUSCULAR | Status: AC
  Administered 2012-05-04: 4 g via INTRAVENOUS
  Filled 2012-05-04: qty 100

## 2012-05-04 MED ORDER — ACETAMINOPHEN 160 MG/5ML PO SOLN
975.0000 mg | Freq: Four times a day (QID) | ORAL | Status: DC
  Filled 2012-05-04: qty 40.6

## 2012-05-04 MED ORDER — 0.9 % SODIUM CHLORIDE (POUR BTL) OPTIME
TOPICAL | Status: DC | PRN
  Administered 2012-05-04: 6000 mL

## 2012-05-04 MED ORDER — MORPHINE SULFATE 2 MG/ML IJ SOLN
2.0000 mg | INTRAMUSCULAR | Status: DC | PRN
  Administered 2012-05-05: 2 mg via INTRAVENOUS
  Administered 2012-05-05: 4 mg via INTRAVENOUS
  Administered 2012-05-05: 2 mg via INTRAVENOUS
  Filled 2012-05-04: qty 1
  Filled 2012-05-04: qty 2
  Filled 2012-05-04: qty 1

## 2012-05-04 MED ORDER — SODIUM CHLORIDE 0.45 % IV SOLN
INTRAVENOUS | Status: DC
  Administered 2012-05-04: 14:00:00 via INTRAVENOUS

## 2012-05-04 SURGICAL SUPPLY — 65 items
ADAPTER CARDIO PERF ANTE/RETRO (ADAPTER) ×3 IMPLANT
ATTRACTOMAT 16X20 MAGNETIC DRP (DRAPES) ×3 IMPLANT
BAG DECANTER FOR FLEXI CONT (MISCELLANEOUS) ×3 IMPLANT
BLADE STERNUM SYSTEM 6 (BLADE) ×3 IMPLANT
BLADE SURG 15 STRL LF DISP TIS (BLADE) ×2 IMPLANT
BLADE SURG 15 STRL SS (BLADE) ×1
CANISTER SUCTION 2500CC (MISCELLANEOUS) ×3 IMPLANT
CANNULA GUNDRY RCSP 15FR (MISCELLANEOUS) ×3 IMPLANT
CATH ROBINSON RED A/P 18FR (CATHETERS) ×12 IMPLANT
CATH THORACIC 36FR (CATHETERS) ×3 IMPLANT
CATH THORACIC 36FR RT ANG (CATHETERS) ×3 IMPLANT
CLOTH BEACON ORANGE TIMEOUT ST (SAFETY) ×3 IMPLANT
CONT SPEC STER OR (MISCELLANEOUS) ×3 IMPLANT
COVER SURGICAL LIGHT HANDLE (MISCELLANEOUS) ×6 IMPLANT
CRADLE DONUT ADULT HEAD (MISCELLANEOUS) ×3 IMPLANT
DRAPE SLUSH MACHINE 52X66 (DRAPES) IMPLANT
DRAPE SLUSH/WARMER DISC (DRAPES) IMPLANT
DRSG COVADERM 4X14 (GAUZE/BANDAGES/DRESSINGS) ×3 IMPLANT
ELECT CAUTERY BLADE 6.4 (BLADE) ×3 IMPLANT
ELECT REM PT RETURN 9FT ADLT (ELECTROSURGICAL) ×6
ELECTRODE REM PT RTRN 9FT ADLT (ELECTROSURGICAL) ×4 IMPLANT
GLOVE BIO SURGEON STRL SZ 6 (GLOVE) ×18 IMPLANT
GLOVE BIO SURGEON STRL SZ 6.5 (GLOVE) ×9 IMPLANT
GLOVE BIO SURGEON STRL SZ7 (GLOVE) IMPLANT
GLOVE BIO SURGEON STRL SZ7.5 (GLOVE) IMPLANT
GLOVE EUDERMIC 7 POWDERFREE (GLOVE) ×6 IMPLANT
GOWN PREVENTION PLUS XLARGE (GOWN DISPOSABLE) ×3 IMPLANT
GOWN STRL NON-REIN LRG LVL3 (GOWN DISPOSABLE) ×18 IMPLANT
HEART VENT LT CURVED (MISCELLANEOUS) ×3 IMPLANT
HEMOSTAT POWDER SURGIFOAM 1G (HEMOSTASIS) ×9 IMPLANT
HEMOSTAT SURGICEL 2X14 (HEMOSTASIS) ×3 IMPLANT
KIT BASIN OR (CUSTOM PROCEDURE TRAY) ×3 IMPLANT
KIT CATH CPB BARTLE (MISCELLANEOUS) ×3 IMPLANT
KIT ROOM TURNOVER OR (KITS) ×3 IMPLANT
KIT SUCTION CATH 14FR (SUCTIONS) ×6 IMPLANT
LINE VENT (MISCELLANEOUS) ×3 IMPLANT
NS IRRIG 1000ML POUR BTL (IV SOLUTION) ×21 IMPLANT
PACK OPEN HEART (CUSTOM PROCEDURE TRAY) ×3 IMPLANT
PAD ARMBOARD 7.5X6 YLW CONV (MISCELLANEOUS) ×6 IMPLANT
SET CARDIOPLEGIA MPS 5001102 (MISCELLANEOUS) ×3 IMPLANT
SPONGE GAUZE 4X4 12PLY (GAUZE/BANDAGES/DRESSINGS) ×3 IMPLANT
SUT BONE WAX W31G (SUTURE) ×3 IMPLANT
SUT ETHIBON 2 0 V 52N 30 (SUTURE) ×12 IMPLANT
SUT ETHIBOND 2 0 SH (SUTURE) ×2
SUT ETHIBOND 2 0 SH 36X2 (SUTURE) ×4 IMPLANT
SUT PROLENE 3 0 SH 1 (SUTURE) ×3 IMPLANT
SUT PROLENE 3 0 SH DA (SUTURE) ×3 IMPLANT
SUT PROLENE 4 0 RB 1 (SUTURE) ×6
SUT PROLENE 4-0 RB1 .5 CRCL 36 (SUTURE) ×12 IMPLANT
SUT PROLENE 6 0 C 1 30 (SUTURE) ×6 IMPLANT
SUT STEEL 6MS V (SUTURE) IMPLANT
SUT STEEL SZ 6 DBL 3X14 BALL (SUTURE) ×9 IMPLANT
SUT VIC AB 1 CTX 36 (SUTURE) ×3
SUT VIC AB 1 CTX36XBRD ANBCTR (SUTURE) ×6 IMPLANT
SUTURE E-PAK OPEN HEART (SUTURE) ×3 IMPLANT
SYSTEM SAHARA CHEST DRAIN ATS (WOUND CARE) ×3 IMPLANT
TAPE CLOTH SURG 4X10 WHT LF (GAUZE/BANDAGES/DRESSINGS) ×3 IMPLANT
TAPE PAPER 2X10 WHT MICROPORE (GAUZE/BANDAGES/DRESSINGS) ×3 IMPLANT
TOWEL OR 17X24 6PK STRL BLUE (TOWEL DISPOSABLE) ×6 IMPLANT
TOWEL OR 17X26 10 PK STRL BLUE (TOWEL DISPOSABLE) ×6 IMPLANT
TRAY FOLEY IC TEMP SENS 14FR (CATHETERS) ×3 IMPLANT
TUBE SUCT INTRACARD DLP 20F (MISCELLANEOUS) ×3 IMPLANT
UNDERPAD 30X30 INCONTINENT (UNDERPADS AND DIAPERS) ×3 IMPLANT
VALVE MAGNA EASE AORTIC 23MM (Prosthesis & Implant Heart) ×3 IMPLANT
WATER STERILE IRR 1000ML POUR (IV SOLUTION) ×6 IMPLANT

## 2012-05-04 NOTE — Preoperative (Signed)
Beta Blockers   Reason not to administer Beta Blockers:Not Applicable 

## 2012-05-04 NOTE — Anesthesia Procedure Notes (Addendum)
Performed by: Jeani Hawking    Procedure Name: Intubation Date/Time: 05/04/2012 8:55 AM Performed by: Jeani Hawking Pre-anesthesia Checklist: Patient identified, Emergency Drugs available, Suction available and Patient being monitored Patient Re-evaluated:Patient Re-evaluated prior to inductionOxygen Delivery Method: Circle system utilized Preoxygenation: Pre-oxygenation with 100% oxygen Intubation Type: IV induction Ventilation: Mask ventilation without difficulty Laryngoscope Size: Mac, 3, Miller and 2 Grade View: Grade III Tube type: Oral Tube size: 8.0 mm Number of attempts: 3 (1xSRNAw/miller 2, 2xDrOssey w/MAC 3) Airway Equipment and Method: Stylet Placement Confirmation: ETT inserted through vocal cords under direct vision,  positive ETCO2 and breath sounds checked- equal and bilateral Secured at: 22 cm Tube secured with: Tape Dental Injury: Teeth and Oropharynx as per pre-operative assessment  Difficulty Due To: Difficulty was anticipated

## 2012-05-04 NOTE — Op Note (Signed)
Ryan Burke, Ryan Burke                 ACCOUNT NO.:  [id number]  MEDICAL RECORD NO.:  [id number]  LOCATION:  2315                         FACILITY:  [facility]  PHYSICIAN:  Evelene Croon, M.D.     DATE OF BIRTH:  [birth year]  DATE OF PROCEDURE:  05/04/2012 DATE OF DISCHARGE:                              OPERATIVE REPORT   PREOPERATIVE DIAGNOSIS:  Severe aortic insufficiency.  POSTOPERATIVE DIAGNOSIS:  Severe aortic insufficiency.  OPERATIVE PROCEDURES:  Median sternotomy, extracorporeal circulation, aortic valve replacement using a 23-mm Edwards pericardial Magna-Ease valve.  ATTENDING SURGEON:  Evelene Croon, M.D.  ASSISTANT:  Doree Fudge, PA  ANESTHESIA:  General endotracheal.  CLINICAL HISTORY:  This patient is a 72 year old gentleman who has been followed by Dr. Jacinto Halim with a 1-2 year history of intermittent chest discomfort as well as shortness of breath with exertion and orthopnea. He underwent a Lexiscan stress test on March 11, 2012 that showed resting inferior and lateral ST depression.  There was very mild lateral ischemia and ejection fraction of 44%.  Cardiac catheterizations on April 12, 2012 showed no coronary disease and mildly reduced left ventricular ejection fraction of 45-50% with severe aortic insufficiency.  There was no aneurysm.  Right heart pressures were normal with decreased cardiac index of 1.88.  A 2D echocardiogram from March 09, 2012 had showed moderate aortic insufficiency and mild mitral and tricuspid regurgitation with left ventricular ejection fraction of 56%.  Given his echo and cardiac catheterization findings and progression of symptoms, it was felt that it was time to proceed with aortic valve replacement.  I felt that a tissue valve would be best given his age.  I discussed the pros and cons of mechanical and tissue valves with the patient and his son and grandson.  Both understand and speak [place] and act as a Nurse, learning disability.  They were  in agreement with a tissue valve.  I discussed the operative procedure with the patient and his son and grandson including alternatives, benefits, and risks including, but not limited to, bleeding, blood transfusion, infection, stroke, myocardial infarction, heart block requiring permanent pacemaker, organ dysfunction, and death.  He understood and agreed to proceed.  OPERATIVE PROCEDURE IN DETAIL:  The patient was seen in the preoperative holding area and the proper patient and proper operation were confirmed after reviewing his history and physical.  Consent was signed by me.  He was given preoperative intravenous antibiotics and taken back to the operating room in stable condition.  After induction of general endotracheal anesthesia, a Foley catheter was placed in bladder using sterile technique.  Then, the chest, abdomen, and both lower extremities were prepped and draped in usual sterile manner.  TEE was performed at Dr. Arta Bruce.  This showed severe aortic insufficiency, which appeared to be central.  There was no significant mitral regurgitation. There was no significant tricuspid regurgitation.  Left ventricular dimension was mildly enlarged and there was mild left ventricular dysfunction.  Then, the chest was opened through a median sternotomy incision.  The pericardium was opened in midline.  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  These were divided sharply to expose the right atrium  and ascending aorta.  Then, the patient was heparinized when an adequate ACT was obtained.  The distal ascending aorta was cannulated using a 20-French aortic cannula for arterial inflow.  Venous outflow was achieved using a two-stage venous cannula through the right atrial appendage.  An antegrade cardioplegia and vent cannula was inserted in aortic root.  Then, the patient was placed on cardiopulmonary bypass and the remainder of  the adhesions over the right atrium were divided to expose the superior pulmonary vein.  Then, a left ventricular vent was placed through the right superior pulmonary vein.  A retrograde cardioplegia cannula was inserted through a pursestring suture in the right atrium and advanced in the coronary sinus without difficulty.  Then, the aorta was crossclamped and 1000 mL of cold blood retrograde cardioplegia was administered with slow arrest of the heart.  Systemic hypothermia to 32 degrees centigrade and topical hypothermia with iced saline was used.  Temperature probe was placed in septum insulating pad in the pericardium.  Cold blood retrograde cardioplegia was given about 20 minutes intervals to maintain myocardial temperature around 10 degrees centigrade.  The aorta was opened transversely just above the sino-tubular junction. Examination of the native valve showed there were 3 leaflets.  There was marked thickening and retraction of the free edges of the leaflets. This created a large central area where there was no coarctation.  There was no calcium present in the valve.  The left and right coronary ostia were identified and there were no signs of plaque or calcium around the ostia.  Then, the native valve leaflets were excised.  The valve was sized and a 23-mm Edwards pericardial Magna-Ease valve was chosen.  This was model number [id number], serial number [id number].  Then, a series of pledgeted 2-0 Ethibond horizontal mattress sutures were placed around the aortic annulus with pledgets in a subannular position.  The sutures were placed through the sewing ring and valve lowered in place.  Sutures tied sequentially and the valve seated nicely.  The coronary ostia were not obstructed.  The patient was then rewarmed to 37 degrees centigrade. The aorta was closed in 2 layers using continuous 4-0 Prolene suture with felt strips to reinforce closure.  Then, after de-airing maneuvers were  performed, the head was placed in the Trendelenburg position and the crossclamp was removed with time of 72 minutes.  There was spontaneous return of complete heart block rhythm.  Two temporary right ventricular and right atrial pacing wires placed and brought out through the skin.  The patient was then paced in AV sequential mode at 90 beats per minute.  When the temperature rewarmed to 37 degrees centigrade, he was weaned from cardiopulmonary bypass on no inotropic agents.  Total bypass time was 101 minutes.  TEE showed good left ventricular function. The prosthetic valve was functioning normally with no evidence of perivalvular leak or regurgitation through the valve.  There was trivial mitral regurgitation.  Then, protamine was given and the venous and aortic cannulas were removed without difficulty.  Hemostasis was achieved.  The patient was given 1 unit of platelets due to thrombocytopenia with platelet count of 80,000.  After obtaining hemostasis, right-angle chest tube was positioned in the posterior pericardium and a straight tube in the anterior mediastinum.  The sternum was then closed with double #6 stainless steel wires.  The fascia was closed with continuous #1 Vicryl suture.  Subcutaneous tissue was closed with continuous 2-0 Vicryl and the skin with a 3-0 Vicryl subcuticular closure.  The sponge, needle, and instrument counts were correct according to the scrub nurse. Dry sterile dressing were applied over the incisions around chest tubes, which were hooked to Pleur-Evac suction.  The patient remained hemodynamically stable and transferred to the SICU in guarded, but stable condition.     Evelene Croon, M.D.     BB/MEDQ  D:  05/04/2012  T:  05/04/2012  Job:  [id number]  cc:   Pamella Pert, MD

## 2012-05-04 NOTE — Anesthesia Preprocedure Evaluation (Addendum)
Anesthesia Evaluation  Patient identified by MRN, date of birth, ID band Patient awake    Reviewed: Allergy & Precautions, H&P , NPO status , Patient's Chart, lab work & pertinent test results, reviewed documented beta blocker date and time   History of Anesthesia Complications Negative for: history of anesthetic complications  Airway Mallampati: III TM Distance: >3 FB Neck ROM: Full  Mouth opening: Limited Mouth Opening  Dental  (+) Teeth Intact, Dental Advisory Given and Missing   Pulmonary shortness of breath and at rest, asthma ,          Cardiovascular hypertension, Pt. on medications + Valvular Problems/Murmurs MR and AI     Neuro/Psych negative neurological ROS  negative psych ROS   GI/Hepatic Neg liver ROS, GERD-  Medicated and Controlled,  Endo/Other  negative endocrine ROS  Renal/GU negative Renal ROS     Musculoskeletal negative musculoskeletal ROS (+)   Abdominal   Peds  Hematology negative hematology ROS (+)   Anesthesia Other Findings   Reproductive/Obstetrics negative OB ROS                         Anesthesia Physical Anesthesia Plan  ASA: III  Anesthesia Plan: General   Post-op Pain Management:    Induction: Intravenous  Airway Management Planned: Oral ETT  Additional Equipment: Arterial line, CVP and PA Cath  Intra-op Plan:   Post-operative Plan: Extubation in OR  Informed Consent: I have reviewed the patients History and Physical, chart, labs and discussed the procedure including the risks, benefits and alternatives for the proposed anesthesia with the patient or authorized representative who has indicated his/her understanding and acceptance.     Plan Discussed with: CRNA and Surgeon  Anesthesia Plan Comments:         Anesthesia Quick Evaluation

## 2012-05-04 NOTE — H&P (Signed)
[address]            [place] [address]          [phone]      PCP is Dorrene German, MD  Referring Provider is Pamella Pert, MD  Chief Complaint   Patient presents with   .  Shortness of Breath     Referral from Dr Jacinto Halim for surgical eval for severe aortic regurgitation, Cardiac Cath 04/13/11, Echo 03/09/2012   HPI:  The patient is a 72 year old gentleman from [place] who has been in the [place] for approximately 4-5 years and is here with his son and grandson today who speak English well and can interpret for him. He was seen by [facility] with a one to two-year history of intermittent chest discomfort as well as shortness of breath with exertion and orthopnea. A Lexiscan stress test on 03/11/2012 showed resting inferior and lateral ST depression. There was very mild lateral ischemia and an ejection fraction of 44%. He underwent cardiac catheterization on 04/12/2012 which showed no coronary disease and a left ventricular ejection fraction of 45-50% with severe aortic insufficiency. There was no aortic aneurysm. Right heart pressures were normal with decreased cardiac index of 1.88. A 2-D echocardiogram from 03/09/2012 had shown moderate aortic insufficiency and mild mitral and tricuspid regurgitation with a left ventricular ejection fraction of 56%.  Past Medical History   Diagnosis  Date   .  Hypertension    .  Hyperlipidemia    .  Aortic regurgitation    .  Mitral regurgitation    .  Chest pain at rest    .  SOB (shortness of breath)    .  GERD (gastroesophageal reflux disease)     Past Surgical History   Procedure  Date   .  Cataract extraction, bilateral     Family History   Problem  Relation  Age of Onset   .  Heart disease  Sister    Social History  History   Substance Use Topics   .  Smoking status:  Former Smoker     Types:  Cigarettes     Quit date:  03/30/2001   .  Smokeless tobacco:  Never Used   .  Alcohol Use:  Yes     Current Outpatient Prescriptions   Medication  Sig  Dispense  Refill   .  albuterol (PROVENTIL HFA;VENTOLIN HFA) 108 (90 BASE) MCG/ACT inhaler  Inhale 2 puffs into the lungs every 6 (six) hours as needed. For shortness of breath     .  aspirin EC 81 MG tablet  Take 81 mg by mouth daily.     Marland Kitchen  doxazosin (CARDURA) 4 MG tablet  Take 4 mg by mouth daily.     Marland Kitchen  lisinopril (PRINIVIL,ZESTRIL) 20 MG tablet  Take 20 mg by mouth daily.     .  nitroGLYCERIN (NITROSTAT) 0.4 MG SL tablet  Place 0.4 mg under the tongue every 5 (five) minutes x 3 doses as needed. For chest pain     .  omeprazole (PRILOSEC) 20 MG capsule  Take 20 mg by mouth daily.     .  pravastatin (PRAVACHOL) 20 MG tablet  Take 20 mg by mouth every evening.     No Known Allergies  Review of Systems  Constitutional: Positive for activity change and fatigue.  HENT: Negative.  Eyes:  Negative.  Respiratory: Positive for shortness of breath.  With exertion. He has orthopnea.  Cardiovascular: Positive for chest pain. Negative for leg swelling.  Gastrointestinal: Negative.  Genitourinary: Negative.  Musculoskeletal: Negative.  Neurological: Negative.  Hematological: Negative.  Psychiatric/Behavioral: Negative.  BP 164/72  Pulse 78  Resp 20  Ht 5\' 4"  (1.626 m)  Wt 150 lb (68.04 kg)  BMI 25.75 kg/m2  SpO2 97%  Physical Exam  Constitutional: He is oriented to person, place, and time. He appears well-developed and well-nourished. No distress.  HENT:  Head: Normocephalic and atraumatic.  Mouth/Throat: Oropharynx is clear and moist.  Eyes: Conjunctivae normal and EOM are normal. Pupils are equal, round, and reactive to light.  Neck: Normal range of motion. Neck supple. No JVD present. No thyromegaly present.  Cardiovascular: Normal rate and regular rhythm.  3/6 diastolic murmur at apex. 2/6 systolic murmur left of sternum.  Pulmonary/Chest: Effort normal and breath sounds normal. No respiratory distress. He has no rales.   Abdominal: Soft. Bowel sounds are normal. He exhibits no distension and no mass. There is no tenderness.  Musculoskeletal: Normal range of motion. He exhibits no edema.  Lymphadenopathy:  He has no cervical adenopathy.  Neurological: He is alert and oriented to person, place, and time. He has normal strength. No cranial nerve deficit or sensory deficit.  Skin: Skin is warm and dry.  Psychiatric: He has a normal mood and affect.  Diagnostic Tests:  Cardiac Cath:  Procedural data:  RA pressure 10/6 Mean 7mm mercury. .  RV pressure 32/10and Right ventricular EDP 11mm Hg.  PA pressure 29/7 a mean of 28m mercury. PA saturation 54%  Pulmonary capillary wedge 18** with a mean of 17 Hg. Aortic saturation 90%  Cardiac output was 3.26h cardiac index of 1.88 Fick.  Angiographic data:  RCA: Normal and dominant  Left main coronary artery: Normal  LAD: Smooth and normal.  Circumflex coronary artery: Normal  LV gram mild global hypokinesis, ejection fraction 45-50%.  Ascending aortogram: Severe aortic regurgitation evident. No evidence of ascending aortic aneurysm.  Impression: Normal right heart catheterizaton with decreased cardiac output and cardiac index. No suggestion of intracardiac shunting with normal Qp:Qs ratio.  Coronary arteriogram revealing normal coronary arteries, right dominant circulation  Severe aortic regurgitation.  Recommendation: I discussed the findings of the catheterization with the patient's daughter-in-law. Her explained to them that he will probably need a request replacement. His left systolic function is in the lower limit of normal to mildly reduced at around 45-50%. I suspect the chest pain which is exertional is probably related to aortic regurgitation.  Impression/Plan:  He has severe aortic insufficiency with symptoms of exertional dyspnea, orthopnea, and chest pain with exertion. I agree that aortic valve replacement is the best treatment to improve his symptoms and  quality of life and prevent progressive left ventricular deterioration. I'll plan to use a tissue valve given his age. I discussed the operative procedure with the patient and family including alternatives, benefits and risks; including but not limited to bleeding, blood transfusion, infection, stroke, myocardial infarction, graft failure, heart block requiring a permanent pacemaker, organ dysfunction, and death. Sahmir B Dano understands and agrees to proceed. We will schedule surgery for Wed 05/04/12.

## 2012-05-04 NOTE — Anesthesia Postprocedure Evaluation (Signed)
Anesthesia Post Note  Patient: Ryan Burke  Procedure(s) Performed: Procedure(s) (LRB): AORTIC VALVE REPLACEMENT (AVR) (N/A) INTRAOPERATIVE TRANSESOPHAGEAL ECHOCARDIOGRAM (N/A)  Anesthesia type: General  Patient location: ICU  Post pain: Pain level controlled  Post assessment: Post-op Vital signs reviewed  Last Vitals:  Filed Vitals:   05/04/12 1500  BP: 98/71  Pulse: 90  Temp: 36.4 C  Resp: 12    Post vital signs: stable  Level of consciousness: Patient remains intubated per anesthesia plan  Complications: No apparent anesthesia complications

## 2012-05-04 NOTE — Progress Notes (Signed)
  Echocardiogram Echocardiogram Transesophageal (OR) has been performed.  Jorje Guild 05/04/2012, 10:44 AM

## 2012-05-04 NOTE — Brief Op Note (Signed)
05/04/2012  11:30 AM  PATIENT:  Ryan Burke  72 y.o. male  PRE-OPERATIVE DIAGNOSIS:  Severe Aortic Insufficiency  POST-OPERATIVE DIAGNOSIS:  Severe Aortic Insufficiency  PROCEDURE:  INTRAOPERATIVE TRANSESOPHAGEAL ECHOCARDIOGRAM, MEDIAN STERNOTOMY for AORTIC VALVE REPLACEMENT (AVR) using a Pericardial Tissue Valve (size 23 mm)   SURGEON:  Surgeon(s) and Role:    * [name], MD - Primary  PHYSICIAN ASSISTANT: Doree Fudge PA-C   ANESTHESIA:   general  EBL:  Total I/O In: -  Out: 450 [Urine:450]  BLOOD ADMINISTERED:One PLTS  DRAINS:  Chest Tube(s) in the Mediastinal and Pleural spaces   SPECIMEN:  Source of Specimen:  Native AV leaflets. Edges of leaflets rounded and no calcium  DISPOSITION OF SPECIMEN:  PATHOLOGY  COUNTS CORRECT:  YES  DICTATION: .Dragon Dictation  PLAN OF CARE: Admit to inpatient   PATIENT DISPOSITION:  ICU - intubated and hemodynamically stable.   Delay start of Pharmacological VTE agent (>24hrs) due to surgical blood loss or risk of bleeding: yes   PRE OP WEIGHT: 72 kg

## 2012-05-04 NOTE — OR Nursing (Signed)
12:00 noon - call to vol. Desk to inform family off pump, 1st call to SICU.   12:35pm - 2nd call to SICU

## 2012-05-04 NOTE — Procedures (Signed)
Extubation Procedure Note  Patient Details:   Name: Ryan Burke DOB: February 27, 1941 MRN: [id number]   Airway Documentation:     Evaluation  O2 sats: stable throughout Complications: No apparent complications Patient did tolerate procedure well. Bilateral Breath Sounds: Other (Comment) (coarse) Suctioning: Airway;Oral Yes  Pt tolerated rapid wean. Achieved VC, -40 NIF and was positive for cuff leak. Pt extubated to 4lpm [place], no strider heard. Pt did need to be nasotracheal suctioned to help clear secretions. Pt resting more comfortably at this time. Vitals are within normal limits. RT will continue to monitor.   Beatris Si 05/04/2012, 6:50 PM

## 2012-05-04 NOTE — Transfer of Care (Signed)
Immediate Anesthesia Transfer of Care Note  Patient: Ryan Burke  Procedure(s) Performed: Procedure(s) (LRB) with comments: AORTIC VALVE REPLACEMENT (AVR) (N/A) INTRAOPERATIVE TRANSESOPHAGEAL ECHOCARDIOGRAM (N/A)  Patient Location: SICU  Anesthesia Type:General  Level of Consciousness: sedated and Patient remains intubated per anesthesia plan  Airway & Oxygen Therapy: Patient remains intubated per anesthesia plan and Patient placed on Ventilator (see vital sign flow sheet for setting)  Post-op Assessment: Report given to PACU RN and Post -op Vital signs reviewed and stable  Post vital signs: Reviewed and stable  Complications: No apparent anesthesia complications

## 2012-05-04 NOTE — Interval H&P Note (Signed)
History and Physical Interval Note:  05/04/2012 8:27 AM  Ryan Burke  has presented today for surgery, with the diagnosis of Severe AI  The various methods of treatment have been discussed with the patient and family. After consideration of risks, benefits and other options for treatment, the patient has consented to  Procedure(s) (LRB) with comments: AORTIC VALVE REPLACEMENT (AVR) (N/A) INTRAOPERATIVE TRANSESOPHAGEAL ECHOCARDIOGRAM (N/A) as a surgical intervention .  The patient's history has been reviewed, patient examined, no change in status, stable for surgery.  I have reviewed the patient's chart and labs.  Questions were answered to the patient's satisfaction.     [name]

## 2012-05-04 NOTE — Progress Notes (Signed)
TCTS BRIEF SICU PROGRESS NOTE  Day of Surgery  S/P Procedure(s) (LRB): AORTIC VALVE REPLACEMENT (AVR) (N/A) INTRAOPERATIVE TRANSESOPHAGEAL ECHOCARDIOGRAM (N/A)   Waking up on vent AAI paced w/ stable hemodynamics Chest tube output low UOP adequate Labs okay  Plan: Continue routine early postop  OWEN,CLARENCE H 05/04/2012 5:41 PM

## 2012-05-05 ENCOUNTER — Inpatient Hospital Stay (HOSPITAL_COMMUNITY): Payer: Medicaid Other

## 2012-05-05 LAB — GLUCOSE, CAPILLARY: Glucose-Capillary: 121 mg/dL — ABNORMAL HIGH (ref 70–99)

## 2012-05-05 LAB — POCT I-STAT, CHEM 8
Chloride: 102 mEq/L (ref 96–112)
Glucose, Bld: 142 mg/dL — ABNORMAL HIGH (ref 70–99)
HCT: 26 % — ABNORMAL LOW (ref 39.0–52.0)
Potassium: 4.5 mEq/L (ref 3.5–5.1)
Sodium: 137 mEq/L (ref 135–145)

## 2012-05-05 LAB — CBC
Hemoglobin: 10.6 g/dL — ABNORMAL LOW (ref 13.0–17.0)
MCH: 30.6 pg (ref 26.0–34.0)
MCH: 30.4 pg (ref 26.0–34.0)
MCHC: 35.6 g/dL (ref 30.0–36.0)
MCHC: 34.9 g/dL (ref 30.0–36.0)
Platelets: 128 10*3/uL — ABNORMAL LOW (ref 150–400)

## 2012-05-05 LAB — CREATININE, SERUM: Creatinine, Ser: 2.23 mg/dL — ABNORMAL HIGH (ref 0.50–1.35)

## 2012-05-05 LAB — PREPARE PLATELET PHERESIS

## 2012-05-05 LAB — BASIC METABOLIC PANEL
Calcium: 7.8 mg/dL — ABNORMAL LOW (ref 8.4–10.5)
GFR calc non Af Amer: 43 mL/min — ABNORMAL LOW (ref >90)
Glucose, Bld: 137 mg/dL — ABNORMAL HIGH (ref 70–99)
Sodium: 139 mEq/L (ref 135–145)

## 2012-05-05 MED ORDER — AMIODARONE LOAD VIA INFUSION
150.0000 mg | Freq: Once | INTRAVENOUS | Status: AC
  Administered 2012-05-05: 150 mg via INTRAVENOUS

## 2012-05-05 MED ORDER — SODIUM CHLORIDE 0.9 % IV SOLN
INTRAVENOUS | Status: DC
  Administered 2012-05-05: 22:00:00 via INTRAVENOUS

## 2012-05-05 MED ORDER — INSULIN ASPART 100 UNIT/ML ~~LOC~~ SOLN
0.0000 [IU] | SUBCUTANEOUS | Status: DC
  Administered 2012-05-05 – 2012-05-06 (×5): 2 [IU] via SUBCUTANEOUS

## 2012-05-05 MED ORDER — ENOXAPARIN SODIUM 30 MG/0.3ML ~~LOC~~ SOLN
30.0000 mg | Freq: Every day | SUBCUTANEOUS | Status: DC
  Administered 2012-05-05 – 2012-05-09 (×5): 30 mg via SUBCUTANEOUS
  Filled 2012-05-05 (×6): qty 0.3

## 2012-05-05 MED ORDER — AMIODARONE HCL IN DEXTROSE 360-4.14 MG/200ML-% IV SOLN
0.5000 mg/min | INTRAVENOUS | Status: DC
  Filled 2012-05-05: qty 200

## 2012-05-05 MED ORDER — ALBUMIN HUMAN 5 % IV SOLN
12.5000 g | Freq: Once | INTRAVENOUS | Status: AC
  Administered 2012-05-05: 12.5 g via INTRAVENOUS
  Filled 2012-05-05: qty 250

## 2012-05-05 MED ORDER — DOPAMINE-DEXTROSE 3.2-5 MG/ML-% IV SOLN
3.0000 ug/kg/min | INTRAVENOUS | Status: DC
  Administered 2012-05-05: 3 ug/kg/min via INTRAVENOUS

## 2012-05-05 MED ORDER — PROMETHAZINE HCL 25 MG/ML IJ SOLN
12.5000 mg | Freq: Four times a day (QID) | INTRAMUSCULAR | Status: DC | PRN
  Administered 2012-05-05: 12.5 mg via INTRAVENOUS
  Filled 2012-05-05: qty 1

## 2012-05-05 MED ORDER — METOCLOPRAMIDE HCL 5 MG/ML IJ SOLN
10.0000 mg | Freq: Four times a day (QID) | INTRAMUSCULAR | Status: DC
  Administered 2012-05-05 – 2012-05-06 (×3): 10 mg via INTRAVENOUS
  Filled 2012-05-05 (×6): qty 2

## 2012-05-05 MED ORDER — AMIODARONE HCL IN DEXTROSE 360-4.14 MG/200ML-% IV SOLN
INTRAVENOUS | Status: AC
  Administered 2012-05-05: 0.999 mg/min via INTRAVENOUS
  Filled 2012-05-05: qty 200

## 2012-05-05 MED ORDER — SODIUM CHLORIDE 0.9 % IJ SOLN
INTRAMUSCULAR | Status: AC
  Administered 2012-05-05: 3 mL
  Filled 2012-05-05: qty 10

## 2012-05-05 MED ORDER — DOPAMINE-DEXTROSE 3.2-5 MG/ML-% IV SOLN
INTRAVENOUS | Status: AC
  Filled 2012-05-05: qty 250

## 2012-05-05 MED ORDER — AMIODARONE HCL IN DEXTROSE 360-4.14 MG/200ML-% IV SOLN
1.0000 mg/min | INTRAVENOUS | Status: DC
  Administered 2012-05-05: 0.999 mg/min via INTRAVENOUS
  Filled 2012-05-05: qty 200

## 2012-05-05 MED FILL — Sodium Bicarbonate IV Soln 8.4%: INTRAVENOUS | Qty: 50 | Status: AC

## 2012-05-05 MED FILL — Potassium Chloride Inj 2 mEq/ML: INTRAVENOUS | Qty: 40 | Status: AC

## 2012-05-05 MED FILL — Lidocaine HCl IV Inj 20 MG/ML: INTRAVENOUS | Qty: 5 | Status: AC

## 2012-05-05 MED FILL — Sodium Chloride IV Soln 0.9%: INTRAVENOUS | Qty: 1000 | Status: AC

## 2012-05-05 MED FILL — Electrolyte-R (PH 7.4) Solution: INTRAVENOUS | Qty: 4000 | Status: AC

## 2012-05-05 MED FILL — Heparin Sodium (Porcine) Inj 1000 Unit/ML: INTRAMUSCULAR | Qty: 10 | Status: AC

## 2012-05-05 MED FILL — Sodium Chloride Irrigation Soln 0.9%: Qty: 3000 | Status: AC

## 2012-05-05 MED FILL — Mannitol IV Soln 20%: INTRAVENOUS | Qty: 500 | Status: AC

## 2012-05-05 MED FILL — Magnesium Sulfate Inj 50%: INTRAMUSCULAR | Qty: 10 | Status: AC

## 2012-05-05 MED FILL — Heparin Sodium (Porcine) Inj 1000 Unit/ML: INTRAMUSCULAR | Qty: 30 | Status: AC

## 2012-05-05 NOTE — Progress Notes (Signed)
1 Day Post-Op Procedure(s) (LRB): AORTIC VALVE REPLACEMENT (AVR) (N/A) INTRAOPERATIVE TRANSESOPHAGEAL ECHOCARDIOGRAM (N/A) Subjective: No complaints  Objective: Vital signs in last 24 hours: Temp:  [96.3 F (35.7 C)-100.9 F (38.3 C)] 99.5 F (37.5 C) (02/06 0700) Pulse Rate:  [61-90] 79  (02/06 0700) Cardiac Rhythm:  [-] Normal sinus rhythm (02/06 0400) Resp:  [10-27] 17  (02/06 0700) BP: (91-134)/(63-116) 98/65 mmHg (02/06 0700) SpO2:  [95 %-100 %] 95 % (02/06 0700) Arterial Line BP: (93-129)/(63-96) 114/70 mmHg (02/06 0700) FiO2 (%):  [40 %-50 %] 40 % (02/05 1735) Weight:  [72.7 kg (160 lb 4.4 oz)-75 kg (165 lb 5.5 oz)] 75 kg (165 lb 5.5 oz) (02/06 0600)  Hemodynamic parameters for last 24 hours: PAP: (27-52)/(11-32) 42/23 mmHg CO:  [2.8 L/min-6.4 L/min] 6.4 L/min CI:  [1.6 L/min/m2-3.6 L/min/m2] 3.6 L/min/m2  Intake/Output from previous day: 02/05 0701 - 02/06 0700 In: 4808.2 [P.O.:300; I.V.:3482.2; Blood:596; NG/GT:30; IV Piggyback:400] Out: 4595 [Urine:2555; Emesis/NG output:200; Blood:1450; Chest Tube:390] Intake/Output this shift:    General appearance: alert and cooperative Neurologic: intact Heart: regular rate and rhythm, S1, S2 normal, no murmur, click, rub or gallop Lungs: clear to auscultation bilaterally Extremities: extremities normal, atraumatic, no cyanosis or edema Wound: dressing dry  Lab Results:  Basename 05/05/12 0359 05/04/12 1941 05/04/12 1915  WBC 10.2 -- 9.3  HGB 10.6* 10.2* --  HCT 29.8* 30.0* --  PLT 123* -- 136*   BMET:  Basename 05/05/12 0359 05/04/12 1941 05/02/12 1538  NA 139 138 --  Burke 4.6 4.9 --  CL 107 107 --  CO2 23 -- 21  GLUCOSE 137* 121* --  BUN 15 13 --  CREATININE 1.56* 1.40* --  CALCIUM 7.8* -- 8.6    PT/INR:  Basename 05/04/12 1341  LABPROT 16.5*  INR 1.37   ABG    Component Value Date/Time   PHART 7.298* 05/04/2012 1938   HCO3 24.4* 05/04/2012 1938   TCO2 26 05/04/2012 1941   ACIDBASEDEF 2.0 05/04/2012 1938   O2SAT 96.0 05/04/2012 1938   CBG (last 3)   Basename 05/05/12 0359 05/04/12 2355 05/04/12 1936  GLUCAP 126* 129* 106*    Assessment/Plan: S/P Procedure(s) (LRB): AORTIC VALVE REPLACEMENT (AVR) (N/A) INTRAOPERATIVE TRANSESOPHAGEAL ECHOCARDIOGRAM (N/A) Mobilize Diuresis d/c tubes/lines Continue foley due to patient in ICU and urinary output monitoring Mild elevation of creat from preop. Observe. Will hold off on diuretic for now. He was on ACE I preop which may contribute to this.   LOS: 1 day    Ryan Burke 05/05/2012

## 2012-05-05 NOTE — Progress Notes (Signed)
T. CTS  Postop day 1 aortic valve replacement Low urine output and soft blood pressure this afternoon, renal dose dopamine started with improvement P.m. labs show increased creatinine 1.5 up to 2.2 Potassium at 4.5, hematocrit 26.9 Patient's pulmonary status stable, sinus rhythm Follow creatinine and urine output

## 2012-05-05 NOTE — Progress Notes (Signed)
Nutrition Brief Note  Patient identified on the Malnutrition Screening Tool (MST) Report for recent weight loss without trying (patient unsure).  Body mass index is 29.29 kg/(m^2). Patient meets criteria for Overweight based on current BMI.   Wt Readings from Last 4 Encounters:  05/05/12 165 lb 5.5 oz (75 kg)  05/05/12 165 lb 5.5 oz (75 kg)  05/02/12 160 lb 4.8 oz (72.712 kg)  04/27/12 150 lb (68.04 kg)    Patient s/p aortic valve replacement 2/5.  Patient sleeping soundly upon RD visitation.  Diet advanced to Carbohydrate Modified Medium Calorie today.    Labs and medications reviewed.  No nutrition interventions warranted at this time.    Please consult RD as needed.  Maureen Chatters, RD, LDN Pager #: [phone] After-Hours Pager #: [phone]

## 2012-05-05 NOTE — Progress Notes (Signed)
Ar 2200 hrs pt vomited 300cc undigested food and bile. Cardiac monitor showing SVT rate 185 at 2205 followed by Atrial fib 140-150's.  Dr Maren Beach notified of same.  Amiodarone bolus 150 mg given, follwed by gtt at  1mg /min. With rate responding now  80-90's with BP 99/79.  Albumin, phenergan given per order.  Family at bedside updated on pt status.  Will continue to monitor closely .

## 2012-05-05 NOTE — Progress Notes (Signed)
  Amiodarone Drug - Drug Interaction Consult Note  Recommendations: No changes at this time.  Statin dose is appropriate.  Continue to monitor HR with beta-blocker therapy.  Continue to monitor QTc with other QTc prolonging agents on board.   Amiodarone is metabolized by the cytochrome P450 system and therefore has the potential to cause many drug interactions. Amiodarone has an average plasma half-life of 50 days (range 20 to 100 days).   There is potential for drug interactions to occur several weeks or months after stopping treatment and the onset of drug interactions may be slow after initiating amiodarone.   [x]  Statins: Increased risk of myopathy. Simvastatin- restrict dose to 20mg  daily. Other statins: counsel patients to report any muscle pain or weakness immediately.  []  Anticoagulants: Amiodarone can increase anticoagulant effect. Consider warfarin dose reduction. Patients should be monitored closely and the dose of anticoagulant altered accordingly, remembering that amiodarone levels take several weeks to stabilize.  []  Antiepileptics: Amiodarone can increase plasma concentration of phenytoin, phenytoin dose should be reduced. Note that small changes in phenytoin dose can result in large changes in phenytoin levels. Monitor patient closely and counsel on signs of toxicity.  [x]  Beta blockers: increased risk of bradycardia, AV block and myocardial depression. Sotalol - avoid concomitant use.  []   Calcium channel blockers (diltiazem and verapamil): increased risk of bradycardia, AV block and myocardial depression.  []   Cyclosporine: Amiodarone increases levels of cyclosporine. Reduced dose of cyclosporine is recommended.  []  Digoxin dose should be halved when amiodarone is started.  []  Diuretics: increased risk of cardiotoxicity if hypokalemia occurs.  []  Oral hypoglycemic agents (glyburide, glipizide, glimepiride): increased risk of hypoglycemia. Patient's glucose levels should  be monitored closely when initiating amiodarone therapy.   [x]  Drugs that prolong the QT interval: Concurrent therapy is contraindicated due to the increased risk of torsades de pointes; . Antibiotics: e.g. fluoroquinolones, erythromycin. . Antiarrhythmics: e.g. quinidine, procainamide, disopyramide, sotalol. . Antipsychotics: e.g. phenothiazines, haloperidol.  . Lithium, tricyclic antidepressants, and methadone. Thank You,   [employer], Pharm.D., BCPS Clinical Pharmacist Pager [phone] 05/05/2012 10:34 PM

## 2012-05-06 ENCOUNTER — Inpatient Hospital Stay (HOSPITAL_COMMUNITY): Payer: Medicaid Other

## 2012-05-06 ENCOUNTER — Encounter (HOSPITAL_COMMUNITY): Payer: Self-pay | Admitting: Surgery

## 2012-05-06 LAB — BASIC METABOLIC PANEL
CO2: 27 mEq/L (ref 19–32)
Chloride: 103 mEq/L (ref 96–112)
GFR calc Af Amer: 45 mL/min — ABNORMAL LOW (ref >90)
Potassium: 4.1 mEq/L (ref 3.5–5.1)
Sodium: 137 mEq/L (ref 135–145)

## 2012-05-06 LAB — TYPE AND SCREEN
ABO/RH(D): A POS
Antibody Screen: NEGATIVE
Unit division: 0

## 2012-05-06 LAB — HEPATIC FUNCTION PANEL
ALT: 14 U/L (ref 0–53)
AST: 23 U/L (ref 0–37)
Albumin: 2.5 g/dL — ABNORMAL LOW (ref 3.5–5.2)
Alkaline Phosphatase: 68 U/L (ref 39–117)
Bilirubin, Direct: 0.4 mg/dL — ABNORMAL HIGH (ref 0.0–0.3)
Indirect Bilirubin: 0.9 mg/dL (ref 0.3–0.9)
Total Bilirubin: 1.3 mg/dL — ABNORMAL HIGH (ref 0.3–1.2)
Total Protein: 5.4 g/dL — ABNORMAL LOW (ref 6.0–8.3)

## 2012-05-06 LAB — CBC
HCT: 25.8 % — ABNORMAL LOW (ref 39.0–52.0)
Hemoglobin: 8.9 g/dL — ABNORMAL LOW (ref 13.0–17.0)
MCV: 86.3 fL (ref 78.0–100.0)
RBC: 2.99 MIL/uL — ABNORMAL LOW (ref 4.22–5.81)
WBC: 10 10*3/uL (ref 4.0–10.5)

## 2012-05-06 LAB — GLUCOSE, CAPILLARY
Glucose-Capillary: 142 mg/dL — ABNORMAL HIGH (ref 70–99)
Glucose-Capillary: 105 mg/dL — ABNORMAL HIGH (ref 70–99)
Glucose-Capillary: 154 mg/dL — ABNORMAL HIGH (ref 70–99)

## 2012-05-06 LAB — AMYLASE: Amylase: 72 U/L (ref 0–105)

## 2012-05-06 MED ORDER — PANTOPRAZOLE SODIUM 40 MG PO TBEC
40.0000 mg | DELAYED_RELEASE_TABLET | Freq: Every day | ORAL | Status: DC
  Administered 2012-05-07 – 2012-05-10 (×4): 40 mg via ORAL
  Filled 2012-05-06 (×4): qty 1

## 2012-05-06 MED ORDER — BISACODYL 10 MG RE SUPP: 10.0000 mg | Freq: Every day | RECTAL | Status: DC | PRN

## 2012-05-06 MED ORDER — AMIODARONE HCL IN DEXTROSE 360-4.14 MG/200ML-% IV SOLN
30.0000 mg/h | INTRAVENOUS | Status: DC
  Administered 2012-05-06: 30 mg/h via INTRAVENOUS
  Filled 2012-05-06 (×2): qty 200

## 2012-05-06 MED ORDER — FENTANYL CITRATE 0.05 MG/ML IJ SOLN
12.5000 ug | INTRAMUSCULAR | Status: DC | PRN
  Administered 2012-05-06 (×2): 12.5 ug via INTRAVENOUS
  Filled 2012-05-06 (×2): qty 2

## 2012-05-06 MED ORDER — ACETAMINOPHEN 325 MG PO TABS: 650.0000 mg | ORAL_TABLET | Freq: Four times a day (QID) | ORAL | Status: DC | PRN

## 2012-05-06 MED ORDER — SODIUM CHLORIDE 0.9 % IV SOLN: 250.0000 mL | INTRAVENOUS | Status: DC | PRN

## 2012-05-06 MED ORDER — OXYCODONE HCL 5 MG PO TABS
5.0000 mg | ORAL_TABLET | ORAL | Status: DC | PRN
  Administered 2012-05-08: 10 mg via ORAL
  Filled 2012-05-06: qty 2

## 2012-05-06 MED ORDER — AMIODARONE HCL 200 MG PO TABS
400.0000 mg | ORAL_TABLET | Freq: Two times a day (BID) | ORAL | Status: DC
  Administered 2012-05-06 – 2012-05-10 (×9): 400 mg via ORAL
  Filled 2012-05-06 (×10): qty 2

## 2012-05-06 MED ORDER — DOCUSATE SODIUM 100 MG PO CAPS
200.0000 mg | ORAL_CAPSULE | Freq: Every day | ORAL | Status: DC
  Administered 2012-05-09: 200 mg via ORAL
  Filled 2012-05-06 (×4): qty 2

## 2012-05-06 MED ORDER — BISACODYL 5 MG PO TBEC: 10.0000 mg | DELAYED_RELEASE_TABLET | Freq: Every day | ORAL | Status: DC | PRN

## 2012-05-06 MED ORDER — ONDANSETRON HCL 4 MG/2ML IJ SOLN
4.0000 mg | Freq: Four times a day (QID) | INTRAMUSCULAR | Status: DC | PRN
  Administered 2012-05-07: 4 mg via INTRAVENOUS
  Filled 2012-05-06: qty 2

## 2012-05-06 MED ORDER — FUROSEMIDE 40 MG PO TABS
40.0000 mg | ORAL_TABLET | Freq: Every day | ORAL | Status: AC
  Administered 2012-05-07 – 2012-05-09 (×3): 40 mg via ORAL
  Filled 2012-05-06 (×3): qty 1

## 2012-05-06 MED ORDER — METRONIDAZOLE 500 MG PO TABS
500.0000 mg | ORAL_TABLET | Freq: Three times a day (TID) | ORAL | Status: DC
  Administered 2012-05-06 – 2012-05-10 (×11): 500 mg via ORAL
  Filled 2012-05-06 (×15): qty 1

## 2012-05-06 MED ORDER — ONDANSETRON HCL 4 MG PO TABS
4.0000 mg | ORAL_TABLET | Freq: Four times a day (QID) | ORAL | Status: DC | PRN
  Administered 2012-05-07: 4 mg via ORAL
  Filled 2012-05-06: qty 1

## 2012-05-06 MED ORDER — POTASSIUM CHLORIDE CRYS ER 20 MEQ PO TBCR
20.0000 meq | EXTENDED_RELEASE_TABLET | Freq: Two times a day (BID) | ORAL | Status: AC
  Administered 2012-05-07 – 2012-05-09 (×6): 20 meq via ORAL
  Filled 2012-05-06 (×7): qty 1

## 2012-05-06 MED ORDER — TRAMADOL HCL 50 MG PO TABS: 50.0000 mg | ORAL_TABLET | Freq: Four times a day (QID) | ORAL | Status: DC | PRN

## 2012-05-06 MED ORDER — METOPROLOL TARTRATE 25 MG PO TABS
25.0000 mg | ORAL_TABLET | Freq: Two times a day (BID) | ORAL | Status: DC
  Administered 2012-05-06 – 2012-05-10 (×9): 25 mg via ORAL
  Filled 2012-05-06 (×10): qty 1

## 2012-05-06 MED ORDER — SODIUM CHLORIDE 0.9 % IJ SOLN: 3.0000 mL | INTRAMUSCULAR | Status: DC | PRN

## 2012-05-06 MED ORDER — TRAMADOL HCL 50 MG PO TABS
50.0000 mg | ORAL_TABLET | ORAL | Status: DC | PRN
  Administered 2012-05-06: 100 mg via ORAL
  Administered 2012-05-06: 50 mg via ORAL
  Filled 2012-05-06: qty 1
  Filled 2012-05-06: qty 2

## 2012-05-06 MED ORDER — SODIUM CHLORIDE 0.9 % IJ SOLN
3.0000 mL | Freq: Two times a day (BID) | INTRAMUSCULAR | Status: DC
  Administered 2012-05-06 – 2012-05-10 (×8): 3 mL via INTRAVENOUS

## 2012-05-06 MED ORDER — MOVING RIGHT ALONG BOOK
Freq: Once | Status: AC
  Administered 2012-05-06: 12:00:00
  Filled 2012-05-06: qty 1

## 2012-05-06 MED ORDER — INSULIN ASPART 100 UNIT/ML ~~LOC~~ SOLN
0.0000 [IU] | SUBCUTANEOUS | Status: DC
  Administered 2012-05-06 – 2012-05-07 (×2): 2 [IU] via SUBCUTANEOUS

## 2012-05-06 MED ORDER — ASPIRIN EC 325 MG PO TBEC
325.0000 mg | DELAYED_RELEASE_TABLET | Freq: Every day | ORAL | Status: DC
  Administered 2012-05-07 – 2012-05-10 (×4): 325 mg via ORAL
  Filled 2012-05-06 (×5): qty 1

## 2012-05-06 NOTE — Progress Notes (Signed)
Subjective:  Decreased UOP and elevated S. Cr noted and patient started on Dopamine @ 3cmg. Had brief A. Fib with rvr.  Objective:  Vital Signs in the last 24 hours: Temp:  [97.5 F (36.4 C)-98.8 F (37.1 C)] 98.1 F (36.7 C) (02/07 0714) Pulse Rate:  [38-188] 92  (02/07 0800) Resp:  [10-30] 25  (02/07 0800) BP: (85-142)/(49-82) 142/82 mmHg (02/07 0800) SpO2:  [93 %-98 %] 95 % (02/07 0800) Weight:  [75.7 kg (166 lb 14.2 oz)] 75.7 kg (166 lb 14.2 oz) (02/07 0600)  Intake/Output from previous day: 02/06 0701 - 02/07 0700 In: 4719.1 [I.V.:4369.1; IV Piggyback:350] Out: 480 [Urine:480]  Physical Exam:   General appearance: alert, cooperative and no distress Eyes: conjunctivae/corneas clear. PERRL, EOM's intact. Fundi benign. Neck: CVP lines  Neck: JVP - normal, carotids 2+= without bruits Resp: clear to auscultation bilaterally and decreased breath sounds at bases Chest wall: Surgical site not inspected Cardio: S1, S2 normal and II/VI SEM heard at the RSB GI: soft, non-tender; bowel sounds normal; no masses,  no organomegaly Extremities: extremities normal, atraumatic, no cyanosis or edema    Lab Results:  Basename 05/06/12 0345 05/05/12 1610  WBC 10.0 11.9*  HGB 8.9* 9.4*  PLT 116* 128*    Basename 05/06/12 0345 05/05/12 1610 05/05/12 1607 05/05/12 0359  NA 137 -- 137 --  K 4.1 -- 4.5 --  CL 103 -- 102 --  CO2 27 -- -- 23  GLUCOSE 128* -- 142* --  BUN 25* -- 22 --  CREATININE 1.71* 2.23* -- --   No results found for this basename: TROPONINI:2,CK,MB:2 in the last 72 hours Hepatic Function Panel  Basename 05/06/12 0345  PROT 5.4*  ALBUMIN 2.5*  AST 23  ALT 14  ALKPHOS 68  BILITOT 1.3*  BILIDIR 0.4*  IBILI 0.9   No results found for this basename: CHOL in the last 72 hours No results found for this basename: PROTIME in the last 72 hours Lipid Panel     Component Value Date/Time   CHOL 203* 07/29/2009 2340   TRIG 195* 07/29/2009 2340   HDL 35* 07/29/2009  2340   CHOLHDL 5.8 Ratio 07/29/2009 2340   VLDL 39 07/29/2009 2340   LDLCALC 129* 07/29/2009 2340     EKG: 05/06/12 NSR, LVH. NOrmal intervals. No ischemia.  Tele> Paroxysmal A.. Fib with RVR   Assessment/Plan:  AVR Hypertension Acute renal insufficiency improving Paroxysmal A. Fib  Rec: A. Fib post AVR. Probably dopa contributing. Could consider PO amio loading. Will follow sidelines.   Pamella Pert, M.D. 05/06/2012, 8:36 AM [facility] Pager: [phone] Office: [phone] If no answer: [phone]

## 2012-05-06 NOTE — Progress Notes (Signed)
CARDIAC REHAB PHASE I   PRE:  Rate/Rhythm: 69SR  BP:  Supine: 91/57  Sitting:   Standing:    SaO2: 95%2L  MODE:  Ambulation: 150 ft   POST:  Rate/Rhythem: 84SR  BP:  Supine:   Sitting: 90/52  Standing:    SaO2: 92%2L 1345-1416 Pt's daughters-in-law interpreted for me. Pt walked 150 ft on 2L with rolling walker and asst x 1. Sweaty. Denied dizziness. Appeared SOB by end of walk and was tired. Back to bed. Left on 2L. Sats 92%.  Duanne Limerick

## 2012-05-06 NOTE — Progress Notes (Signed)
Pt converted to NSR at  0304.  At 0517 when getting OOB pt had a run of SVT 150's  Followed by atrial fib 120s and eventually converted spontaneously  To NSR at 0530 rate 88.  No further nausea or vomiting.  Active bowel sounds.  Remains on Amiodarone and dopamine gtts.  Family with pt through the night translating prn.

## 2012-05-06 NOTE — Progress Notes (Signed)
Pt does not speak [place]. Per son, at bedside, pt able to give correct answers when asked who he is, the month and year, where he is and why he came to the hospital.  Pt is alert and appears appropriate.  Able to provide number for pain scale assessment.

## 2012-05-06 NOTE — Progress Notes (Signed)
Pt has had up to 3 loose bowel movement since beginning of shift. On call Physician Sunday Corn, MD) paged and notified, new orders received, will continue plan of care.

## 2012-05-06 NOTE — Progress Notes (Signed)
Pt ambulating to bathroom with assistance multiple times this afternoon. Pt has had several loose stools. Pt reports the last one having black stool, however, RN was unable to see because the pt flushed it. Requested that the pt not flush next time so it can be inspected. Pt denies any dizziness or light headedness while standing. Obtained BP and was 104/61. Will continue to monitor.

## 2012-05-06 NOTE — Progress Notes (Signed)
Received pt from 2300. Pt is stable. When assessing pt with 2300 RN present, we both noticed new drainage on his midsternal incision. Outlined drainage and will change dressing as needed.

## 2012-05-06 NOTE — Progress Notes (Signed)
2 Days Post-Op Procedure(s) (LRB): AORTIC VALVE REPLACEMENT (AVR) (N/A) INTRAOPERATIVE TRANSESOPHAGEAL ECHOCARDIOGRAM (N/A) Subjective: No complaints  Objective: Vital signs in last 24 hours: Temp:  [97.5 F (36.4 C)-98.8 F (37.1 C)] 98.1 F (36.7 C) (02/07 0714) Pulse Rate:  [38-188] 92  (02/07 0800) Cardiac Rhythm:  [-] Normal sinus rhythm (02/07 0800) Resp:  [10-30] 25  (02/07 0800) BP: (85-142)/(49-82) 142/82 mmHg (02/07 0800) SpO2:  [93 %-98 %] 95 % (02/07 0800) Weight:  [75.7 kg (166 lb 14.2 oz)] 75.7 kg (166 lb 14.2 oz) (02/07 0600)  Hemodynamic parameters for last 24 hours:    Intake/Output from previous day: 02/06 0701 - 02/07 0700 In: 4719.1 [I.V.:4369.1; IV Piggyback:350] Out: 480 [Urine:480] Intake/Output this shift: Total I/O In: 70.9 [I.V.:70.9] Out: 150 [Urine:150]  General appearance: alert and cooperative Neurologic: intact Heart: regular rate and rhythm, S1, S2 normal, no murmur, click, rub or gallop Lungs: diminished breath sounds bibasilar Abdomen: soft, non-tender; bowel sounds normal; no masses,  no organomegaly Extremities: mild edema Wound: dressing dry  Lab Results:  Basename 05/06/12 0345 05/05/12 1610  WBC 10.0 11.9*  HGB 8.9* 9.4*  HCT 25.8* 26.9*  PLT 116* 128*   BMET:  Basename 05/06/12 0345 05/05/12 1610 05/05/12 1607 05/05/12 0359  NA 137 -- 137 --  K 4.1 -- 4.5 --  CL 103 -- 102 --  CO2 27 -- -- 23  GLUCOSE 128* -- 142* --  BUN 25* -- 22 --  CREATININE 1.71* 2.23* -- --  CALCIUM 7.8* -- -- 7.8*    PT/INR:  Basename 05/04/12 1341  LABPROT 16.5*  INR 1.37   ABG    Component Value Date/Time   PHART 7.298* 05/04/2012 1938   HCO3 24.4* 05/04/2012 1938   TCO2 27 05/05/2012 1607   ACIDBASEDEF 2.0 05/04/2012 1938   O2SAT 96.0 05/04/2012 1938   CBG (last 3)   Basename 05/06/12 0713 05/06/12 0338 05/05/12 2310  GLUCAP 116* 122* 118*   CXR: ok  Assessment/Plan: S/P Procedure(s) (LRB): AORTIC VALVE REPLACEMENT (AVR)  (N/A) INTRAOPERATIVE TRANSESOPHAGEAL ECHOCARDIOGRAM (N/A)  Hemodynamically stable Postop acute renal failure: improving creat and adequate urine output. Postop A-fib: converted to sinus on amio. Will switch to po. Transfer to 2000 and mobilize.    LOS: 2 days    Evelene Croon K 05/06/2012

## 2012-05-06 NOTE — Progress Notes (Signed)
Pt transferred via wheelchair to room 2018 via wheelchair.  Patient on room air; sat 87% on arrival to new room.  Patient placed on 2 L [place]; sats increased to 92%.  New drainage on midsternal dressing noted, and marked, on assessment with 2000 nurse present at bedside.  Patient left comfortably in bed with 2000 RN and NT at bedside.

## 2012-05-07 LAB — GLUCOSE, CAPILLARY
Glucose-Capillary: 89 mg/dL (ref 70–99)
Glucose-Capillary: 124 mg/dL — ABNORMAL HIGH (ref 70–99)
Glucose-Capillary: 115 mg/dL — ABNORMAL HIGH (ref 70–99)
Glucose-Capillary: 114 mg/dL — ABNORMAL HIGH (ref 70–99)

## 2012-05-07 LAB — CBC
Hemoglobin: 8.5 g/dL — ABNORMAL LOW (ref 13.0–17.0)
MCHC: 34.1 g/dL (ref 30.0–36.0)

## 2012-05-07 LAB — BASIC METABOLIC PANEL
GFR calc Af Amer: 38 mL/min — ABNORMAL LOW (ref >90)
GFR calc non Af Amer: 33 mL/min — ABNORMAL LOW (ref >90)
Glucose, Bld: 105 mg/dL — ABNORMAL HIGH (ref 70–99)
Potassium: 4.3 mEq/L (ref 3.5–5.1)
Sodium: 136 mEq/L (ref 135–145)

## 2012-05-07 LAB — CLOSTRIDIUM DIFFICILE BY PCR: Toxigenic C. Difficile by PCR: NEGATIVE

## 2012-05-07 NOTE — Progress Notes (Signed)
CARDIAC REHAB PHASE I   PRE:  Rate/Rhythm: 71 SR w/ freq PACs  BP:  Supine: 119/71  Sitting:   Standing:    SaO2: 96%2L  MODE:  Ambulation: 250 ft   POST:  Rate/Rhythm: 87 SR w/ freq PACs  BP:  Supine:   Sitting: 116/62  Standing:    SaO2: 95%2L 0946-1020 Pt ambulated slowly with 2L supp O2, walker, and assistance x1. Pt's family at bedside to interpret. Denied CP, dizziness, or lightheadedness during walk. SaO2 remained >95% through rest and walk, currently on 2L. Rhythm seems to have more freq PACs. Different from NSR yesterday. SOB is still biggest factor with ambulation. Returned pt to bed with family in the room.    Ryan Burke

## 2012-05-07 NOTE — Progress Notes (Addendum)
3 Days Post-Op Procedure(s) (LRB): AORTIC VALVE REPLACEMENT (AVR) (N/A) INTRAOPERATIVE TRANSESOPHAGEAL ECHOCARDIOGRAM (N/A) Subjective:  Patients family at bedside to provide translation.  Patient states feeling better.    Objective: Vital signs in last 24 hours: Temp:  [98.3 F (36.8 C)-99.5 F (37.5 C)] 98.3 F (36.8 C) (02/08 0502) Pulse Rate:  [73-86] 73 (02/08 0502) Cardiac Rhythm:  [-] Normal sinus rhythm (02/07 2020) Resp:  [16-20] 20 (02/08 0502) BP: (93-142)/(72-83) 109/73 mmHg (02/08 0502) SpO2:  [92 %-98 %] 98 % (02/08 0502) Weight:  [164 lb 6.4 oz (74.571 kg)] 164 lb 6.4 oz (74.571 kg) (02/08 0502)  Intake/Output from previous day: 02/07 0701 - 02/08 0700 In: 1139.3 [P.O.:812; I.V.:277.3; IV Piggyback:50] Out: 843 [Urine:840; Stool:3]  General appearance: alert, cooperative and no distress Heart: regular rate and rhythm Lungs: diminished bibasilar Abdomen: soft, non-tender; bowel sounds normal; no masses,  no organomegaly Extremities: edema trace Wound: clean and dry  Lab Results:  Recent Labs  05/06/12 0345 05/07/12 0620  WBC 10.0 9.8  HGB 8.9* 8.5*  HCT 25.8* 24.9*  PLT 116* 129*   BMET:  Recent Labs  05/06/12 0345 05/07/12 0620  NA 137 136  K 4.1 4.3  CL 103 103  CO2 27 24  GLUCOSE 128* 105*  BUN 25* 31*  CREATININE 1.71* 1.97*  CALCIUM 7.8* 8.0*    PT/INR:  Recent Labs  05/04/12 1341  LABPROT 16.5*  INR 1.37   ABG    Component Value Date/Time   PHART 7.298* 05/04/2012 1938   HCO3 24.4* 05/04/2012 1938   TCO2 27 05/05/2012 1607   ACIDBASEDEF 2.0 05/04/2012 1938   O2SAT 96.0 05/04/2012 1938   CBG (last 3)   Recent Labs  05/06/12 2319 05/07/12 0436 05/07/12 0803  GLUCAP 154* 89 124*    Assessment/Plan: S/P Procedure(s) (LRB): AORTIC VALVE REPLACEMENT (AVR) (N/A) INTRAOPERATIVE TRANSESOPHAGEAL ECHOCARDIOGRAM (N/A)  1. CV- Previous Atrial Fibrillation, currently NSR- on Amiodarone, Lopressor 2. Pulm- on oxygen, will wean as  tolerated, encouraged use of IS 3. Renal- ARF, improving, mildly volume overloaded, continue Lasix 4. Expected blood loss anemia- Hgb 8.5- will start Iron 5. Deconditioning- continue Ambulation in Hallway 6. Dispo- patient doing better, will wean oxygen as tolerated, possibly ready for d/c on Monday   LOS: 3 days    Lowella Dandy 05/07/2012   Chart reviewed, patient examined, agree with above.

## 2012-05-07 NOTE — Progress Notes (Signed)
Pt ambulated slowly with RW and 2L O2 with assist x1. Pt's family at bedside to interpret. Patient had some N/V this afternoon which has subsided since zofran administration. Patient has SOB while walking. Returned patient to bed in room with family. Will continue to monitor closely. Lajuana Matte, RN

## 2012-05-08 DIAGNOSIS — Z952 Presence of prosthetic heart valve: Secondary | ICD-10-CM

## 2012-05-08 DIAGNOSIS — I4891 Unspecified atrial fibrillation: Secondary | ICD-10-CM

## 2012-05-08 MED ORDER — AMIODARONE HCL 200 MG PO TABS: ORAL_TABLET | ORAL | Status: DC

## 2012-05-08 MED ORDER — METOPROLOL TARTRATE 25 MG PO TABS: 25.0000 mg | ORAL_TABLET | Freq: Two times a day (BID) | ORAL | Status: DC

## 2012-05-08 MED ORDER — OXYCODONE HCL 5 MG PO TABS: 5.0000 mg | ORAL_TABLET | ORAL | Status: DC | PRN

## 2012-05-08 NOTE — Progress Notes (Addendum)
4 Days Post-Op Procedure(s) (LRB): AORTIC VALVE REPLACEMENT (AVR) (N/A) INTRAOPERATIVE TRANSESOPHAGEAL ECHOCARDIOGRAM (N/A) Subjective:  Mr. Deshler has no complaints.  Family at bedside to translate.  +BM  Objective: Vital signs in last 24 hours: Temp:  [98.4 F (36.9 C)-99.4 F (37.4 C)] 98.4 F (36.9 C) (02/09 0417) Pulse Rate:  [70-72] 72 (02/09 0417) Cardiac Rhythm:  [-] Atrial fibrillation (02/08 1930) Resp:  [18] 18 (02/09 0417) BP: (119-140)/(74-85) 125/81 mmHg (02/09 0417) SpO2:  [95 %-97 %] 97 % (02/09 0417) Weight:  [157 lb 3.2 oz (71.305 kg)] 157 lb 3.2 oz (71.305 kg) (02/09 0556)  Intake/Output from previous day: 02/08 0701 - 02/09 0700 In: 60 [P.O.:60] Out: -   General appearance: alert, cooperative and no distress Heart: regular rate and rhythm Lungs: clear to auscultation bilaterally Abdomen: soft, non-tender; bowel sounds normal; no masses,  no organomegaly Extremities: extremities normal, atraumatic, no cyanosis or edema Wound: clean and dry  Lab Results:  Recent Labs  05/06/12 0345 05/07/12 0620  WBC 10.0 9.8  HGB 8.9* 8.5*  HCT 25.8* 24.9*  PLT 116* 129*   BMET:  Recent Labs  05/06/12 0345 05/07/12 0620  NA 137 136  K 4.1 4.3  CL 103 103  CO2 27 24  GLUCOSE 128* 105*  BUN 25* 31*  CREATININE 1.71* 1.97*  CALCIUM 7.8* 8.0*    PT/INR: No results found for this basename: LABPROT, INR,  in the last 72 hours ABG    Component Value Date/Time   PHART 7.298* 05/04/2012 1938   HCO3 24.4* 05/04/2012 1938   TCO2 27 05/05/2012 1607   ACIDBASEDEF 2.0 05/04/2012 1938   O2SAT 96.0 05/04/2012 1938   CBG (last 3)   Recent Labs  05/07/12 1113 05/07/12 1642 05/07/12 2124  GLUCAP 106* 115* 114*    Assessment/Plan: S/P Procedure(s) (LRB): AORTIC VALVE REPLACEMENT (AVR) (N/A) INTRAOPERATIVE TRANSESOPHAGEAL ECHOCARDIOGRAM (N/A)  1. CV- Episode of rate controlled Atrial Fibrillation yesterday, currently maintaining NSR will continue Amiodarone and  Lopressor 2. Pulm- wean oxygen as tolerated, continue IS 3. Renal- ARF, repeat BMET in AM 4. Volume Overload- weight at baseline, good U/O will continue Lasix for now 5. D/C EPW 6. Dispo- patient with brief A. Fib, currently NSR, will d/c EPW, if no further arrythmia, possible d/c in AM   LOS: 4 days    BARRETT, ERIN 05/08/2012  Patient seen and examined. Plan as outlined above.

## 2012-05-08 NOTE — Discharge Summary (Signed)
Physician Discharge Summary  Patient ID: Ryan Burke MRN: [id number] DOB/AGE: Aug 21, 1940 72 y.o.  Admit date: 05/04/2012 Discharge date: 05/09/2012  Admission Diagnoses:  Patient Active Problem List  Diagnosis  . HYPERLIPIDEMIA  . ESSENTIAL HYPERTENSION, BENIGN  . AORTIC INSUFFICIENCY, MODERATE  . HYPERGLYCEMIA  . ELEVATED PROSTATE SPECIFIC ANTIGEN  . PROTEINURIA  . LIVER FUNCTION TESTS, ABNORMAL, HX OF  . Hypertension  . Hyperlipidemia  . Aortic regurgitation  . Mitral regurgitation   Discharge Diagnoses:   Patient Active Problem List  Diagnosis  . HYPERLIPIDEMIA  . ESSENTIAL HYPERTENSION, BENIGN  . AORTIC INSUFFICIENCY, MODERATE  . HYPERGLYCEMIA  . ELEVATED PROSTATE SPECIFIC ANTIGEN  . PROTEINURIA  . LIVER FUNCTION TESTS, ABNORMAL, HX OF  . Hypertension  . Hyperlipidemia  . Aortic regurgitation  . Mitral regurgitation  . S/P AVR (aortic valve replacement)  . Atrial fibrillation   Discharged Condition: good  History of Present Illness:   Ryan Burke is a 72 yo Non-English Speaking Gentleman.  He was evaluated by Dr. Jacinto Halim with a one -two year history of intermittent chest discomfort and exertional dyspnea and orthopnea.  Stress testing was performed in December and showed resting inferior and lateral ST depression.  There was some mild lateral wall ischemia with a reduced EF of 44%.  Cardiac catheterization was performed in January which did not demonstrate significant coronary disease.  Finally Echocardiogram was performed and confirmed the presence of moderate Aortic Insufficiency with a preserved EF of 56%.  Due to the patient having symptomatic Aortic Valve Disease it was felt he would benefit from surgical intervention.  He presented to [facility] on 04/27/2012 at which time he was evaluated by Dr. Laneta Simmers.  His imaging was reviewed and was felt the patient would benefit from Aortic Valve Replacement.  The risks and benefits were explained to the patient and his family who  translated the information for the patient.  He was agreeable to proceed.  Surgery was tentatively scheduled for 05/04/2012.  Hospital Course:   Ryan Burke presented to [facility] on 05/04/2012.  He was taken to the operating room and underwent Aortic Valve Replacement utilizing a 23 mm Edwards Pericardial Magna Ease conduit.  The patient tolerated the procedure well and was taken to the SICU in stable condition.  He was extubated the evening of surgery.  During his stay in the ICU the patient developed Rapid Atrial Fibrillation.  He developed some Acute Renal Failure with creatinine levels returning to baseline.  He was treated with Amiodarone with successful conversion to NSR.  His chest tubes and arterial lines were removed without difficulty.  Once medically stable the patient was transferred to the step down unit for further convalescence.  The patient is doing well.  He did suffer a brief episode of rate controlled Atrial Fibrillation with conversion to NSR.  The patients pacing wires have been removed.  He is ambulating without difficulty and is tolerating his diet.  He remains on oxygen which we will continue to wean off over the next 24 hours.  Should no further issues arise we anticipate discharge in the morning.  He will follow up with Dr. Laneta Simmers in 3 weeks with a chest xray prior to his appointment.  He will also need to schedule a follow up with Dr. Jacinto Halim.    Significant Diagnostic Studies: angiography:   RA pressure 10/6 Mean 7mm mercury. .  RV pressure 32/10and Right ventricular EDP 11mm Hg.  PA pressure 29/7 a mean of 14m mercury. PA  saturation 54%  Pulmonary capillary wedge 18** with a mean of 17 Hg. Aortic saturation 90%  Cardiac output was 3.26h cardiac index of 1.88 Fick.  Angiographic data:  RCA: Normal and dominant  Left main coronary artery: Normal  LAD: Smooth and normal.  Circumflex coronary artery: Normal  LV gram mild global hypokinesis, ejection fraction 45-50%.    Ascending aortogram: Severe aortic regurgitation evident. No evidence of ascending aortic aneurysm  Treatments: surgery:  Median sternotomy, extracorporeal circulation,  aortic valve replacement using a 23-mm Edwards pericardial Magna-Ease  valve.  Disposition: 01-Home or Self Care  The patient has been discharged on:   1.Beta Blocker:  Yes [  x ]                              No   [   ]                              If No, reason:  2.Ace Inhibitor/ARB: Yes [   ]                                     No  [ x   ]                                     If No, reason: Elevated Creatinine, labile blood pressure  3.Statin:   Yes [   ]                  No  [ x  ]                  If No, reason: No CAD  4.Marlowe Kays:  Yes  [  x ]                  No   [   ]                  If No, reason:        Medication List    STOP taking these medications       lisinopril 20 MG tablet  Commonly known as:  PRINIVIL,ZESTRIL      TAKE these medications       albuterol 108 (90 BASE) MCG/ACT inhaler  Commonly known as:  PROVENTIL HFA;VENTOLIN HFA  Inhale 2 puffs into the lungs every 6 (six) hours as needed. For shortness of breath     amiodarone 200 MG tablet  Commonly known as:  PACERONE  1. Take 2 Tablets (400 mg ) by mouth twice a day for 7 days, then  2. Take 1 Tablet (200 mg) by mouth twice a day for 7 days, then  3. Take 1 Tablet by mouth daily     aspirin EC 81 MG tablet  Take 81 mg by mouth daily.     doxazosin 4 MG tablet  Commonly known as:  CARDURA  Take 4 mg by mouth daily.     ferrous sulfate 325 (65 FE) MG tablet  Take 1 tablet (325 mg total) by mouth daily with breakfast. For one month then stop.     metoprolol tartrate 25 MG tablet  Commonly known as:  LOPRESSOR  Take 1 tablet (25 mg total) by mouth 2 (two) times daily.     nitroGLYCERIN 0.4 MG SL tablet  Commonly known as:  NITROSTAT  Place 0.4 mg under the tongue every 5 (five) minutes x 3 doses as needed. For  chest pain     omeprazole 20 MG capsule  Commonly known as:  PRILOSEC  Take 20 mg by mouth daily.     oxyCODONE 5 MG immediate release tablet  Commonly known as:  Oxy IR/ROXICODONE  Take 1-2 tablets (5-10 mg total) by mouth every 4 (four) hours as needed.     pravastatin 20 MG tablet  Commonly known as:  PRAVACHOL  Take 20 mg by mouth every evening.         Follow-up Information   Follow up with [name], MD In 3 weeks. (Office will mail you appointment date and time)    Contact information:   [address] [place] [address] [phone]       Follow up with [facility] In 3 weeks. (Please get CXR 1 hour prior to appointment with Dr. Laneta Simmers)    Contact information:   [address]       Schedule an appointment as soon as possible for a visit with Pamella Pert, MD. (Please contact office to set up 2-4 week follow)    Contact information:   [address] [place] [address] [phone]       Signed: Lowella Dandy 05/08/2012, 10:12 AM

## 2012-05-08 NOTE — Progress Notes (Signed)
Removed EPW per MD order per hospital policy. Pacing wires intact upon removal, VSS. Patient tolerated well. Will continue to monitor closely. Advised patient and family member that he has to remain in bed for 1 hour. Lajuana Matte, RN

## 2012-05-09 LAB — BASIC METABOLIC PANEL
CO2: 31 mEq/L (ref 19–32)
Calcium: 8.7 mg/dL (ref 8.4–10.5)
Creatinine, Ser: 1.51 mg/dL — ABNORMAL HIGH (ref 0.50–1.35)

## 2012-05-09 MED ORDER — FERROUS SULFATE 325 (65 FE) MG PO TABS: 325.0000 mg | ORAL_TABLET | Freq: Every day | ORAL | Status: DC

## 2012-05-09 MED ORDER — FERROUS SULFATE 325 (65 FE) MG PO TABS
325.0000 mg | ORAL_TABLET | Freq: Every day | ORAL | Status: DC
  Administered 2012-05-09 – 2012-05-10 (×2): 325 mg via ORAL
  Filled 2012-05-09 (×3): qty 1

## 2012-05-09 NOTE — Care Management Note (Signed)
    Page 1 of 1   05/10/2012     4:00:43 PM   CARE MANAGEMENT NOTE 05/10/2012  Patient:  Ryan Burke, Ryan Burke   Account Number:  [id number]  Date Initiated:  05/05/2012  Documentation initiated by:  [facility]  Subjective/Objective Assessment:   Post op AVR.     Action/Plan:   PTA, PT INDEPENDENT, LIVES WITH FAMILY.  WILL FOLLOW FOR HOME NEEDS AS PT PROGRESSES   Anticipated DC Date:  05/11/2012   Anticipated DC Plan:  HOME W HOME HEALTH SERVICES      DC Planning Services  CM consult      Choice offered to / List presented to:             Status of service:  Completed, signed off Medicare Important Message given?   (If response is "NO", the following Medicare IM given date fields will be blank) Date Medicare IM given:   Date Additional Medicare IM given:    Discharge Disposition:  HOME/SELF CARE  Per UR Regulation:  Reviewed for med. necessity/level of care/duration of stay  If discussed at Long Length of Stay Meetings, dates discussed:    Comments:  Contact:  [place], [place] [phone]                 Crader,Punam Relative [phone]  05/09/12 Nereida Schepp,RN,BSN [phone] PT S/P AVR ON 05/04/12.  PTA, PT INDEPENDENT, LIVES WITH FAMILY.   POSSIBLE DC HOME TOMORROW.  WILL FOLLOW FOR HOME NEEDS.

## 2012-05-09 NOTE — Progress Notes (Addendum)
CARDIAC REHAB PHASE I   PRE:  Rate/Rhythm: 87 SR runs of Afib HR108  BP:  Supine: 130/88  Sitting:   Standing:    SaO2: 93 1L 88 RA in bed  MODE:  Ambulation: 550 ft   POST:  Rate/Rhythem: 150 Afib  BP:  Supine:   Sitting: 132/80  Standing:    SaO2: 96-97 RA during walk after walk 93 RA 0855-0940 On arrival pt in bed RA sat 88%. Assisted X 1 and used walker to ambulate. Walked pt on room air, sats during walk 96-97%. Encouraged deep breathing during walk. Pt able to walk 550 feet, he had some troule steering walker. Pt to recliner after walk. HR after walk 150 Afib RA sat 93%. Encouraged pt to use IS and to cough. O2 left off after walk. RN aware of HR and O2 off.  Beatrix Fetters

## 2012-05-09 NOTE — Progress Notes (Signed)
Pt ambulated 550 ft with rolling walker. Pt tolerated ambulation well. Back to chair after walk. Dion Saucier

## 2012-05-09 NOTE — Progress Notes (Addendum)
                   [address]            [place] [address]          [phone]      5 Days Post-Op Procedure(s) (LRB): AORTIC VALVE REPLACEMENT (AVR) (N/A) INTRAOPERATIVE TRANSESOPHAGEAL ECHOCARDIOGRAM (N/A)  Subjective: Family at bedside-patient without complaints.  Objective: Vital signs in last 24 hours: Temp:  [98.3 F (36.8 C)-99.1 F (37.3 C)] 98.3 F (36.8 C) (02/10 0526) Pulse Rate:  [71-75] 75 (02/10 0526) Cardiac Rhythm:  [-] Normal sinus rhythm (02/09 1955) Resp:  [18-20] 20 (02/10 0526) BP: (124-154)/(78-92) 143/91 mmHg (02/10 0526) SpO2:  [95 %-100 %] 97 % (02/10 0526) Weight:  [70.353 kg (155 lb 1.6 oz)] 70.353 kg (155 lb 1.6 oz) (02/10 0526)  Pre op weight  72 kg Current Weight  05/09/12 70.353 kg (155 lb 1.6 oz)   Intake/Output from previous day: 02/09 0701 - 02/10 0700 In: 480 [P.O.:480] Out: -    Physical Exam:  Cardiovascular: RRR Pulmonary: Diminished at bases; no rales, wheezes, or rhonchi. Abdomen: Soft, non tender, bowel sounds present. Extremities:  SCDs in place Wound: Clean and dry.  No erythema or signs of infection.  Lab Results: CBC: Recent Labs  05/07/12 0620  WBC 9.8  HGB 8.5*  HCT 24.9*  PLT 129*   BMET:  Recent Labs  05/07/12 0620  NA 136  K 4.3  CL 103  CO2 24  GLUCOSE 105*  BUN 31*  CREATININE 1.97*  CALCIUM 8.0*    PT/INR:  Lab Results  Component Value Date   INR 1.37 05/04/2012   INR 0.97 05/02/2012   INR 0.97 11/07/2009   ABG:  INR: Will add last result for INR, ABG once components are confirmed Will add last 4 CBG results once components are confirmed  Assessment/Plan:  1. CV - Previous brief afib. He had 3 beats of NSVT around 7:03 pm. SR since then. On Amiodarone 400 bid, Lopressor 25 bid. 2.  Pulmonary - On 2 liters of oxygen via [place]. Wean to room air.Encourage incentive spirometer. 3. Volume Overload - On Lasix 40 daily. 4.  Acute blood loss anemia - Last H and H 8.5 and  24.9. Will start iron. 5.Previous post op arf-creatinine down to 1.51 6.Will discuss discharge disposition with Dr. Jacqlyn Larsen MPA-C 05/09/2012,7:22 AM  He's having frequent PACs and brief spurts of afib 4- 5 beats- give AM meds and observe today- possibly home in AM

## 2012-05-10 NOTE — Progress Notes (Signed)
Chest tube sutures removed per MD order. Steri strips applied. Site WNL Chestine Spore, Bary Richard

## 2012-05-10 NOTE — Progress Notes (Signed)
1305-1400 Cardiac Rehab Completed discharge education with pt, his son and interpreter. Pt voices understanding. He agrees to [facility]. CRP in [place], will send referral.

## 2012-05-10 NOTE — Progress Notes (Signed)
Pt discharge instructions and patient education completed with patient and interpreter. Voiced understanding. IV site d/c. Site WNL. Pt maintained sinus rhythm HR 60s. D/C home with son. Dion Saucier

## 2012-05-10 NOTE — Progress Notes (Signed)
                   [address]            [place] [address]          [phone]      6 Days Post-Op Procedure(s) (LRB): AORTIC VALVE REPLACEMENT (AVR) (N/A) INTRAOPERATIVE TRANSESOPHAGEAL ECHOCARDIOGRAM (N/A)  Subjective: Family at bedside-patient without complaints.  Objective: Vital signs in last 24 hours: Temp:  [97.6 F (36.4 C)-98.8 F (37.1 C)] 97.6 F (36.4 C) (02/11 0407) Pulse Rate:  [71-89] 71 (02/11 0407) Cardiac Rhythm:  [-] Atrial fibrillation (02/10 1950) Resp:  [18] 18 (02/11 0407) BP: (106-136)/(69-86) 136/86 mmHg (02/11 0407) SpO2:  [92 %-97 %] 94 % (02/11 0407) Weight:  [68 kg (149 lb 14.6 oz)] 68 kg (149 lb 14.6 oz) (02/11 0406)  Pre op weight  72 kg Current Weight  05/10/12 68 kg (149 lb 14.6 oz)   Intake/Output from previous day: 02/10 0701 - 02/11 0700 In: 360 [P.O.:360] Out: -    Physical Exam:  Cardiovascular: RRR Pulmonary: Diminished at bases; no rales, wheezes, or rhonchi. Abdomen: Soft, non tender, bowel sounds present. Extremities:  SCDs in place Wound: Clean and dry.  No erythema or signs of infection.  Lab Results: CBC:No results found for this basename: WBC, HGB, HCT, PLT,  in the last 72 hours BMET:   Recent Labs  05/09/12 0540  NA 138  K 4.1  CL 98  CO2 31  GLUCOSE 118*  BUN 27*  CREATININE 1.51*  CALCIUM 8.7    PT/INR:  Lab Results  Component Value Date   INR 1.37 05/04/2012   INR 0.97 05/02/2012   INR 0.97 11/07/2009   ABG:  INR: Will add last result for INR, ABG once components are confirmed Will add last 4 CBG results once components are confirmed  Assessment/Plan:  1. CV - Previous brief afib,PACs yesterday. Last run of afib yesterday around 11 am. SR since then.On Amiodarone 400 bid, Lopressor 25 bid. 2.  Pulmonary -  Weaned to room air yesterday.Encourage incentive spirometer. 3. Volume Overload - On Lasix 40 daily. 4.  Acute blood loss anemia - Last H and H 8.5 and 24.9. Will start  iron. 5.Previous post op arf-creatinine down to 1.51 6.Remove chest tube sutures 7.Will monitor rhythm until noon;if remains in SR, likely discharge   Ryan Burke MPA-C 05/10/2012,7:54 AM

## 2012-05-24 ENCOUNTER — Encounter: Payer: Medicaid Other | Admitting: Surgery

## 2012-06-06 ENCOUNTER — Other Ambulatory Visit: Payer: Self-pay | Admitting: *Deleted

## 2012-06-06 DIAGNOSIS — I359 Nonrheumatic aortic valve disorder, unspecified: Secondary | ICD-10-CM

## 2012-06-07 ENCOUNTER — Ambulatory Visit (INDEPENDENT_AMBULATORY_CARE_PROVIDER_SITE_OTHER): Payer: Self-pay | Admitting: Surgery

## 2012-06-07 ENCOUNTER — Encounter: Payer: Self-pay | Admitting: Surgery

## 2012-06-07 ENCOUNTER — Ambulatory Visit
Admission: RE | Admit: 2012-06-07 | Discharge: 2012-06-07 | Disposition: A | Discharge status: Home / Self Care | Payer: Medicaid Other | Source: Ambulatory Visit | Attending: Surgery | Admitting: Surgery

## 2012-06-07 VITALS — BP 119/73 | HR 54 | Resp 16 | Ht 65.0 in | Wt 153.5 lb

## 2012-06-07 DIAGNOSIS — Z954 Presence of other heart-valve replacement: Secondary | ICD-10-CM

## 2012-06-07 DIAGNOSIS — Z952 Presence of prosthetic heart valve: Secondary | ICD-10-CM

## 2012-06-07 DIAGNOSIS — I351 Nonrheumatic aortic (valve) insufficiency: Secondary | ICD-10-CM

## 2012-06-07 DIAGNOSIS — I359 Nonrheumatic aortic valve disorder, unspecified: Secondary | ICD-10-CM

## 2012-06-07 NOTE — Progress Notes (Signed)
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 The patient's early postoperative recovery while in the hospital was notable for development of postoperative atrial fibrillation requiring amiodarone for conversion. Since hospital discharge the patient reports he has been feeling well. He is walking daily without chest pain or shortness of breath. He has noticed no palpitations..   Current Outpatient Prescriptions  Medication Sig Dispense Refill  . albuterol (PROVENTIL HFA;VENTOLIN HFA) 108 (90 BASE) MCG/ACT inhaler Inhale 2 puffs into the lungs every 6 (six) hours as needed. For shortness of breath      . amiodarone (PACERONE) 200 MG tablet 1. Take 2 Tablets (400 mg ) by mouth twice a day for 7 days, then 2. Take 1 Tablet (200 mg) by mouth twice a day for 7 days, then 3. Take 1 Tablet by mouth daily  90 tablet  1  . aspirin EC 81 MG tablet Take 81 mg by mouth daily.      Marland Kitchen doxazosin (CARDURA) 4 MG tablet Take 4 mg by mouth daily.      . ferrous sulfate 325 (65 FE) MG tablet Take 1 tablet (325 mg total) by mouth daily with breakfast. For one month then stop.      . metoprolol tartrate (LOPRESSOR) 25 MG tablet Take 1 tablet (25 mg total) by mouth 2 (two) times daily.  60 tablet  1  . nitroGLYCERIN (NITROSTAT) 0.4 MG SL tablet Place 0.4 mg under the tongue every 5 (five) minutes x 3 doses as needed. For chest pain      . omeprazole (PRILOSEC) 20 MG capsule Take 20 mg by mouth daily.      Marland Kitchen oxyCODONE (OXY IR/ROXICODONE) 5 MG immediate release tablet Take 1-2 tablets (5-10 mg total) by mouth every 4 (four) hours as needed.  50 tablet  0  . pravastatin (PRAVACHOL) 20 MG tablet Take 20 mg by mouth every evening.       No current facility-administered medications for this visit.    Physical Exam: BP  119/73  Pulse 54  Resp 16  Ht 5\' 5"  (1.651 m)  Wt 153 lb 8 oz (69.627 kg)  BMI 25.54 kg/m2  SpO2 95% He looks well. Cardiac exam shows a regular rate and rhythm with normal valve sounds. Lung exam is clear. The chest incision is healing well and sternum is stable. There is no peripheral edema.  Diagnostic Tests:  *RADIOLOGY REPORT*   Clinical Data: Aortic valve disorder.   CHEST - 2 VIEW   Comparison: May 06, 2012.   Findings: Status post aortic valve replacement surgery.  Sternotomy wires are noted.  No acute pulmonary disease is noted.  No pleural effusion or pneumothorax is noted.  Bony thorax is intact.   IMPRESSION: No acute cardiopulmonary abnormality seen.     Original Report Authenticated By: Lupita Raider.,  M.D.     Impression:  Overall I think he is making good progress following his surgery. I encouraged him to continue walking as much as possible. I asked him not to lift anything heavier than 10 pounds for 3 months postoperatively. He is about 5 weeks postoperatively and I think he could discontinue the amiodarone since he seems to have a regular rhythm and is  bradycardic. His son asked me if he should restart his lisinopril. He has a followup appointment with Dr. Jacinto Halim in the next couple days, and he can decide about that.  Plan:  He'll followup with Dr. Jacinto Halim and will contact me if he develops any problems with his incisions.

## 2013-03-30 ENCOUNTER — Encounter (HOSPITAL_COMMUNITY): Payer: Self-pay | Admitting: Emergency Medicine

## 2013-03-30 ENCOUNTER — Emergency Department (HOSPITAL_COMMUNITY): Payer: Medicaid Other

## 2013-03-30 ENCOUNTER — Emergency Department (HOSPITAL_COMMUNITY)
Admission: EM | Admit: 2013-03-30 | Discharge: 2013-03-30 | Disposition: A | Discharge status: Home / Self Care | Payer: Medicaid Other | Attending: Emergency Medicine | Admitting: Emergency Medicine

## 2013-03-30 DIAGNOSIS — K219 Gastro-esophageal reflux disease without esophagitis: Secondary | ICD-10-CM | POA: Insufficient documentation

## 2013-03-30 DIAGNOSIS — J069 Acute upper respiratory infection, unspecified: Secondary | ICD-10-CM | POA: Insufficient documentation

## 2013-03-30 DIAGNOSIS — J45909 Unspecified asthma, uncomplicated: Secondary | ICD-10-CM | POA: Insufficient documentation

## 2013-03-30 DIAGNOSIS — Z79899 Other long term (current) drug therapy: Secondary | ICD-10-CM | POA: Insufficient documentation

## 2013-03-30 DIAGNOSIS — I10 Essential (primary) hypertension: Secondary | ICD-10-CM | POA: Insufficient documentation

## 2013-03-30 DIAGNOSIS — R51 Headache: Secondary | ICD-10-CM | POA: Insufficient documentation

## 2013-03-30 DIAGNOSIS — I059 Rheumatic mitral valve disease, unspecified: Secondary | ICD-10-CM | POA: Insufficient documentation

## 2013-03-30 DIAGNOSIS — Z7982 Long term (current) use of aspirin: Secondary | ICD-10-CM | POA: Insufficient documentation

## 2013-03-30 DIAGNOSIS — R011 Cardiac murmur, unspecified: Secondary | ICD-10-CM | POA: Insufficient documentation

## 2013-03-30 DIAGNOSIS — R569 Unspecified convulsions: Secondary | ICD-10-CM

## 2013-03-30 DIAGNOSIS — Z87891 Personal history of nicotine dependence: Secondary | ICD-10-CM | POA: Insufficient documentation

## 2013-03-30 DIAGNOSIS — M109 Gout, unspecified: Secondary | ICD-10-CM | POA: Insufficient documentation

## 2013-03-30 DIAGNOSIS — E785 Hyperlipidemia, unspecified: Secondary | ICD-10-CM | POA: Insufficient documentation

## 2013-03-30 DIAGNOSIS — Z954 Presence of other heart-valve replacement: Secondary | ICD-10-CM | POA: Insufficient documentation

## 2013-03-30 HISTORY — DX: Unspecified convulsions: R56.9

## 2013-03-30 LAB — URINE MICROSCOPIC-ADD ON

## 2013-03-30 LAB — CBC
HEMATOCRIT: 40.6 % (ref 39.0–52.0)
HEMOGLOBIN: 14.1 g/dL (ref 13.0–17.0)
MCH: 29.5 pg (ref 26.0–34.0)
MCHC: 34.7 g/dL (ref 30.0–36.0)
MCV: 84.9 fL (ref 78.0–100.0)
Platelets: 154 10*3/uL (ref 150–400)
RBC: 4.78 MIL/uL (ref 4.22–5.81)
RDW: 12.9 % (ref 11.5–15.5)
WBC: 7.4 10*3/uL (ref 4.0–10.5)

## 2013-03-30 LAB — URINALYSIS, ROUTINE W REFLEX MICROSCOPIC
BILIRUBIN URINE: NEGATIVE
GLUCOSE, UA: NEGATIVE mg/dL
Ketones, ur: NEGATIVE mg/dL
Leukocytes, UA: NEGATIVE
Nitrite: NEGATIVE
PH: 6 (ref 5.0–8.0)
Protein, ur: >300 mg/dL — AB
SPECIFIC GRAVITY, URINE: 1.021 (ref 1.005–1.030)
Urobilinogen, UA: 0.2 mg/dL (ref 0.0–1.0)

## 2013-03-30 LAB — POCT I-STAT, CHEM 8
BUN: 9 mg/dL (ref 6–23)
CREATININE: 1.5 mg/dL — AB (ref 0.50–1.35)
Calcium, Ion: 1.22 mmol/L (ref 1.13–1.30)
Chloride: 99 mEq/L (ref 96–112)
Glucose, Bld: 158 mg/dL — ABNORMAL HIGH (ref 70–99)
HEMATOCRIT: 44 % (ref 39.0–52.0)
HEMOGLOBIN: 15 g/dL (ref 13.0–17.0)
Potassium: 3.5 mEq/L — ABNORMAL LOW (ref 3.7–5.3)
SODIUM: 140 meq/L (ref 137–147)
TCO2: 25 mmol/L (ref 0–100)

## 2013-03-30 LAB — BASIC METABOLIC PANEL
BUN: 10 mg/dL (ref 6–23)
CHLORIDE: 99 meq/L (ref 96–112)
CO2: 27 meq/L (ref 19–32)
Calcium: 8.6 mg/dL (ref 8.4–10.5)
Creatinine, Ser: 1.43 mg/dL — ABNORMAL HIGH (ref 0.50–1.35)
GFR calc non Af Amer: 48 mL/min — ABNORMAL LOW (ref >90)
GFR, EST AFRICAN AMERICAN: 55 mL/min — AB (ref >90)
Glucose, Bld: 164 mg/dL — ABNORMAL HIGH (ref 70–99)
POTASSIUM: 3.9 meq/L (ref 3.7–5.3)
Sodium: 139 mEq/L (ref 137–147)

## 2013-03-30 MED ORDER — COLCHICINE 0.6 MG PO TABS: ORAL_TABLET | ORAL | Status: DC

## 2013-03-30 MED ORDER — CEPHALEXIN 500 MG PO CAPS: 500.0000 mg | ORAL_CAPSULE | Freq: Four times a day (QID) | ORAL | Status: DC

## 2013-03-30 NOTE — Discharge Instructions (Signed)
Cough, Adult  A cough is a reflex that helps clear your throat and airways. It can help heal the body or may be a reaction to an irritated airway. A cough may only last 2 or 3 weeks (acute) or may last more than 8 weeks (chronic).  CAUSES Acute cough:  Viral or bacterial infections. Chronic cough:  Infections.  Allergies.  Asthma.  Post-nasal drip.  Smoking.  Heartburn or acid reflux.  Some medicines.  Chronic lung problems (COPD).  Cancer. SYMPTOMS   Cough.  Fever.  Chest pain.  Increased breathing rate.  High-pitched whistling sound when breathing (wheezing).  Colored mucus that you cough up (sputum). TREATMENT   A bacterial cough may be treated with antibiotic medicine.  A viral cough must run its course and will not respond to antibiotics.  Your caregiver may recommend other treatments if you have a chronic cough. HOME CARE INSTRUCTIONS   Only take over-the-counter or prescription medicines for pain, discomfort, or fever as directed by your caregiver. Use cough suppressants only as directed by your caregiver.  Use a cold steam vaporizer or humidifier in your bedroom or home to help loosen secretions.  Sleep in a semi-upright position if your cough is worse at night.  Rest as needed.  Stop smoking if you smoke. SEEK IMMEDIATE MEDICAL CARE IF:   You have pus in your sputum.  Your cough starts to worsen.  You cannot control your cough with suppressants and are losing sleep.  You begin coughing up blood.  You have difficulty breathing.  You develop pain which is getting worse or is uncontrolled with medicine.  You have a fever. MAKE SURE YOU:   Understand these instructions.  Will watch your condition.  Will get help right away if you are not doing well or get worse. Document Released: 09/12/2010 Document Revised: 06/08/2011 Document Reviewed: 09/12/2010 [facility] Patient Information 2014 [place], [place].  Gout Gout is an inflammatory  arthritis caused by a buildup of uric acid crystals in the joints. Uric acid is a chemical that is normally present in the blood. When the level of uric acid in the blood is too high it can form crystals that deposit in your joints and tissues. This causes joint redness, soreness, and swelling (inflammation). Repeat attacks are common. Over time, uric acid crystals can form into masses (tophi) near a joint, destroying bone and causing disfigurement. Gout is treatable and often preventable. CAUSES  The disease begins with elevated levels of uric acid in the blood. Uric acid is produced by your body when it breaks down a naturally found substance called purines. Certain foods you eat, such as meats and fish, contain high amounts of purines. Causes of an elevated uric acid level include:  Being passed down from parent to child (heredity).  Diseases that cause increased uric acid production (such as obesity, psoriasis, and certain cancers).  Excessive alcohol use.  Diet, especially diets rich in meat and seafood.  Medicines, including certain cancer-fighting medicines (chemotherapy), water pills (diuretics), and aspirin.  Chronic kidney disease. The kidneys are no longer able to remove uric acid well.  Problems with metabolism. Conditions strongly associated with gout include:  Obesity.  High blood pressure.  High cholesterol.  Diabetes. Not everyone with elevated uric acid levels gets gout. It is not understood why some people get gout and others do not. Surgery, joint injury, and eating too much of certain foods are some of the factors that can lead to gout attacks. SYMPTOMS   An  attack of gout comes on quickly. It causes intense pain with redness, swelling, and warmth in a joint.  Fever can occur.  Often, only one joint is involved. Certain joints are more commonly involved:  Base of the big toe.  Knee.  Ankle.  Wrist.  Finger. Without treatment, an attack usually goes away  in a few days to weeks. Between attacks, you usually will not have symptoms, which is different from many other forms of arthritis. DIAGNOSIS  Your caregiver will suspect gout based on your symptoms and exam. In some cases, tests may be recommended. The tests may include:  Blood tests.  Urine tests.  X-rays.  Joint fluid exam. This exam requires a needle to remove fluid from the joint (arthrocentesis). Using a microscope, gout is confirmed when uric acid crystals are seen in the joint fluid. TREATMENT  There are two phases to gout treatment: treating the sudden onset (acute) attack and preventing attacks (prophylaxis).  Treatment of an Acute Attack.  Medicines are used. These include anti-inflammatory medicines or steroid medicines.  An injection of steroid medicine into the affected joint is sometimes necessary.  The painful joint is rested. Movement can worsen the arthritis.  You may use warm or cold treatments on painful joints, depending which works best for you.  Treatment to Prevent Attacks.  If you suffer from frequent gout attacks, your caregiver may advise preventive medicine. These medicines are started after the acute attack subsides. These medicines either help your kidneys eliminate uric acid from your body or decrease your uric acid production. You may need to stay on these medicines for a very long time.  The early phase of treatment with preventive medicine can be associated with an increase in acute gout attacks. For this reason, during the first few months of treatment, your caregiver may also advise you to take medicines usually used for acute gout treatment. Be sure you understand your caregiver's directions. Your caregiver may make several adjustments to your medicine dose before these medicines are effective.  Discuss dietary treatment with your caregiver or dietitian. Alcohol and drinks high in sugar and fructose and foods such as meat, poultry, and seafood can  increase uric acid levels. Your caregiver or dietician can advise you on drinks and foods that should be limited. HOME CARE INSTRUCTIONS   Do not take aspirin to relieve pain. This raises uric acid levels.  Only take over-the-counter or prescription medicines for pain, discomfort, or fever as directed by your caregiver.  Rest the joint as much as possible. When in bed, keep sheets and blankets off painful areas.  Keep the affected joint raised (elevated).  Apply warm or cold treatments to painful joints. Use of warm or cold treatments depends on which works best for you.  Use crutches if the painful joint is in your leg.  Drink enough fluids to keep your urine clear or pale yellow. This helps your body get rid of uric acid. Limit alcohol, sugary drinks, and fructose drinks.  Follow your dietary instructions. Pay careful attention to the amount of protein you eat. Your daily diet should emphasize fruits, vegetables, whole grains, and fat-free or low-fat milk products. Discuss the use of coffee, vitamin C, and cherries with your caregiver or dietician. These may be helpful in lowering uric acid levels.  Maintain a healthy body weight. SEEK MEDICAL CARE IF:   You develop diarrhea, vomiting, or any side effects from medicines.  You do not feel better in 24 hours, or you are getting  worse. SEEK IMMEDIATE MEDICAL CARE IF:   Your joint becomes suddenly more tender, and you have chills or a fever. MAKE SURE YOU:   Understand these instructions.  Will watch your condition.  Will get help right away if you are not doing well or get worse. Document Released: 03/13/2000 Document Revised: 07/11/2012 Document Reviewed: 10/28/2011 [facility] Patient Information 2014 [place], [place].

## 2013-03-30 NOTE — ED Notes (Signed)
Pt and pt's family comfortable with d/c and f/u instructions. Prescriptions x2

## 2013-03-30 NOTE — ED Notes (Signed)
Family reports that pt c/o cough, fever and left ankle pain onset yesterday. Family did not take pt temperature. Pt reports injury to left ankle 50 years ago. Presents with pain and swelling to left outer ankle.

## 2013-03-30 NOTE — ED Provider Notes (Signed)
CSN: [id number]     Arrival date & time 03/30/13  7829 History   First MD Initiated Contact with Patient 03/30/13 1027     Chief Complaint  Patient presents with  . Fever  . Cough  . Ankle Pain    Left   (Consider location/radiation/quality/duration/timing/severity/associated sxs/prior Treatment) Patient is a 73 y.o. male presenting with fever, cough, and ankle pain.  Fever Associated symptoms: cough   Cough Associated symptoms: fever   Ankle Pain Associated symptoms: fever    Level 5 caveat due to language barrier, family at bedside asks to translate Pt with 2 days of L lateral ankle swelling, pain. Has history of remote injury but no recent trauma.  Has also had fever, cough, congestion. No vomiting. No diarrhea.   Past Medical History  Diagnosis Date  . Hypertension   . Hyperlipidemia   . Aortic regurgitation   . Mitral regurgitation   . Chest pain at rest   . SOB (shortness of breath)   . GERD (gastroesophageal reflux disease)   . Heart murmur   . Asthma    Past Surgical History  Procedure Laterality Date  . Cataract extraction, bilateral    . Cardiac catheterization    . Aortic valve replacement  05/04/2012    Procedure: AORTIC VALVE REPLACEMENT (AVR);  Surgeon: [name], MD;  Location: [facility] OR;  Service: Open Heart Surgery;  Laterality: N/A;  . Intraoperative transesophageal echocardiogram  05/04/2012    Procedure: INTRAOPERATIVE TRANSESOPHAGEAL ECHOCARDIOGRAM;  Surgeon: [name], MD;  Location[facility] OR;  Service: Open Heart Surgery;  Laterality: N/A;   Family History  Problem Relation Age of Onset  . Heart disease Sister    History  Substance Use Topics  . Smoking status: Former Smoker -- 0.50 packs/day for 4 years    Types: Cigarettes    Quit date: 03/30/2001  . Smokeless tobacco: Never Used     Comment: quit alcohol 03  . Alcohol Use: No     Comment: once  a week    Review of Systems  Constitutional: Positive for fever.  Respiratory: Positive  for cough.    All other systems reviewed and are negative except as noted in HPI.   Allergies  Review of patient's allergies indicates no known allergies.  Home Medications   Current Outpatient Rx  Name  Route  Sig  Dispense  Refill  . albuterol (PROVENTIL HFA;VENTOLIN HFA) 108 (90 BASE) MCG/ACT inhaler   Inhalation   Inhale 2 puffs into the lungs every 6 (six) hours as needed. For shortness of breath         . amiodarone (PACERONE) 200 MG tablet      1. Take 2 Tablets (400 mg ) by mouth twice a day for 7 days, then 2. Take 1 Tablet (200 mg) by mouth twice a day for 7 days, then 3. Take 1 Tablet by mouth daily   90 tablet   1   . aspirin EC 81 MG tablet   Oral   Take 81 mg by mouth daily.         Marland Kitchen doxazosin (CARDURA) 4 MG tablet   Oral   Take 4 mg by mouth daily.         . ferrous sulfate 325 (65 FE) MG tablet   Oral   Take 1 tablet (325 mg total) by mouth daily with breakfast. For one month then stop.         . metoprolol tartrate (LOPRESSOR) 25  MG tablet   Oral   Take 1 tablet (25 mg total) by mouth 2 (two) times daily.   60 tablet   1   . nitroGLYCERIN (NITROSTAT) 0.4 MG SL tablet   Sublingual   Place 0.4 mg under the tongue every 5 (five) minutes x 3 doses as needed. For chest pain         . omeprazole (PRILOSEC) 20 MG capsule   Oral   Take 20 mg by mouth daily.         Marland Kitchen oxyCODONE (OXY IR/ROXICODONE) 5 MG immediate release tablet   Oral   Take 1-2 tablets (5-10 mg total) by mouth every 4 (four) hours as needed.   50 tablet   0   . pravastatin (PRAVACHOL) 20 MG tablet   Oral   Take 20 mg by mouth every evening.          BP 120/76  Pulse 81  Temp(Src) 99.2 F (37.3 C) (Oral)  Resp 18  Ht 5\' 5"  (1.651 m)  SpO2 99% Physical Exam  Nursing note and vitals reviewed. Constitutional: He is oriented to person, place, and time. He appears well-developed and well-nourished.  HENT:  Head: Normocephalic and atraumatic.  Eyes: EOM are  normal. Pupils are equal, round, and reactive to light.  Neck: Normal range of motion. Neck supple.  Cardiovascular: Normal rate, normal heart sounds and intact distal pulses.   Pulmonary/Chest: Effort normal and breath sounds normal. He has no wheezes. He has no rales.  Abdominal: Bowel sounds are normal. He exhibits no distension. There is no tenderness.  Musculoskeletal: Normal range of motion. He exhibits edema (soft tissue swelling without fluctuance over the L lateral maleolus, no signs of joint effusion) and tenderness.  Neurological: He is alert and oriented to person, place, and time. He has normal strength. No cranial nerve deficit or sensory deficit.  Skin: Skin is warm and dry. No rash noted.  Psychiatric: He has a normal mood and affect.    ED Course  Procedures (including critical care time) Labs Review Labs Reviewed  BASIC METABOLIC PANEL - Abnormal; Notable for the following:    Glucose, Bld 164 (*)    Creatinine, Ser 1.43 (*)    GFR calc non Af Amer 48 (*)    GFR calc Af Amer 55 (*)    All other components within normal limits  CBC  URINALYSIS, ROUTINE W REFLEX MICROSCOPIC   Imaging Review Dg Chest 2 View (if Patient Has Fever And/or Copd)  03/30/2013   CLINICAL DATA:  Fever, cough, congestion and headache.  EXAM: CHEST  2 VIEW  COMPARISON:  06/07/2012  FINDINGS: Sternotomy wires are present. Patient is slightly rotated to the right. Lungs are hypoinflated with subtle stable interstitial prominence. No focal consolidation or effusion. Mild stable cardiomegaly. Remainder of the exam is unchanged.  IMPRESSION: No active cardiopulmonary disease.  Mild stable chronic interstitial prominence.  Stable cardiomegaly.   Electronically Signed   By: Elberta Fortis M.D.   On: 03/30/2013 11:01   Dg Ankle Complete Left  03/30/2013   CLINICAL DATA:  Left ankle pain  EXAM: LEFT ANKLE COMPLETE - 3+ VIEW  COMPARISON:  None.  FINDINGS: There is no acute fracture or dislocation. There is an  unfused left distal fibular physis. There is severe soft tissue swelling over the lateral malleolus. The ankle mortise is intact. There is a moderate joint effusion.  IMPRESSION: 1. No acute osseous injury of the left ankle. 2. Soft tissue swelling over  the lateral malleolus. 3. Moderate ankle joint effusion. 4. Unfused left distal fibular physis.   Electronically Signed   By: Elige KoHetal  Patel   On: 03/30/2013 11:04    EKG Interpretation   None       MDM   1. Viral URI   2. Gout     Labs and imaging reviewed. Suspect viral URI/flu. Doubt this is pneumonia. As far as the ankle, likely gout vs cellulitis. Will begin Keflex, Colchicine and PCP followup.    Bralynn Donado B. Bernette MayersSheldon, MD 03/30/13 [phone]

## 2013-04-17 ENCOUNTER — Encounter (HOSPITAL_COMMUNITY): Payer: Self-pay | Admitting: Emergency Medicine

## 2013-04-17 ENCOUNTER — Emergency Department (HOSPITAL_COMMUNITY)
Admission: EM | Admit: 2013-04-17 | Discharge: 2013-04-17 | Disposition: A | Discharge status: Home / Self Care | Payer: Medicaid Other | Attending: Emergency Medicine | Admitting: Emergency Medicine

## 2013-04-17 DIAGNOSIS — E785 Hyperlipidemia, unspecified: Secondary | ICD-10-CM | POA: Insufficient documentation

## 2013-04-17 DIAGNOSIS — I4891 Unspecified atrial fibrillation: Secondary | ICD-10-CM | POA: Insufficient documentation

## 2013-04-17 DIAGNOSIS — R569 Unspecified convulsions: Secondary | ICD-10-CM | POA: Insufficient documentation

## 2013-04-17 DIAGNOSIS — Z7982 Long term (current) use of aspirin: Secondary | ICD-10-CM | POA: Insufficient documentation

## 2013-04-17 DIAGNOSIS — Z87891 Personal history of nicotine dependence: Secondary | ICD-10-CM | POA: Insufficient documentation

## 2013-04-17 DIAGNOSIS — N189 Chronic kidney disease, unspecified: Secondary | ICD-10-CM | POA: Insufficient documentation

## 2013-04-17 DIAGNOSIS — K219 Gastro-esophageal reflux disease without esophagitis: Secondary | ICD-10-CM | POA: Insufficient documentation

## 2013-04-17 DIAGNOSIS — I129 Hypertensive chronic kidney disease with stage 1 through stage 4 chronic kidney disease, or unspecified chronic kidney disease: Secondary | ICD-10-CM | POA: Insufficient documentation

## 2013-04-17 DIAGNOSIS — Z954 Presence of other heart-valve replacement: Secondary | ICD-10-CM | POA: Insufficient documentation

## 2013-04-17 DIAGNOSIS — R109 Unspecified abdominal pain: Secondary | ICD-10-CM | POA: Insufficient documentation

## 2013-04-17 DIAGNOSIS — Z9849 Cataract extraction status, unspecified eye: Secondary | ICD-10-CM | POA: Insufficient documentation

## 2013-04-17 DIAGNOSIS — M25472 Effusion, left ankle: Secondary | ICD-10-CM

## 2013-04-17 DIAGNOSIS — R011 Cardiac murmur, unspecified: Secondary | ICD-10-CM | POA: Insufficient documentation

## 2013-04-17 DIAGNOSIS — Z79899 Other long term (current) drug therapy: Secondary | ICD-10-CM | POA: Insufficient documentation

## 2013-04-17 DIAGNOSIS — M25476 Effusion, unspecified foot: Principal | ICD-10-CM | POA: Insufficient documentation

## 2013-04-17 DIAGNOSIS — J45909 Unspecified asthma, uncomplicated: Secondary | ICD-10-CM | POA: Insufficient documentation

## 2013-04-17 DIAGNOSIS — M25473 Effusion, unspecified ankle: Secondary | ICD-10-CM | POA: Insufficient documentation

## 2013-04-17 MED ORDER — OXYCODONE HCL 5 MG PO TABS: 5.0000 mg | ORAL_TABLET | Freq: Four times a day (QID) | ORAL | Status: DC | PRN

## 2013-04-17 NOTE — ED Notes (Signed)
MD at bedside. 

## 2013-04-17 NOTE — ED Provider Notes (Signed)
CSN: [id number]     Arrival date & time 04/17/13  1032 History   First MD Initiated Contact with Patient 04/17/13 1146     Chief Complaint  Patient presents with  . Fever  . Foot Pain  . Cough  . Abdominal Pain   (Consider location/radiation/quality/duration/timing/severity/associated sxs/prior Treatment) Patient is a 73 y.o. male presenting with lower extremity pain. The history is provided by the patient and a relative. The history is limited by a language barrier. A language interpreter was used.  Foot Pain This is a chronic problem. Episode onset: worse over the last 2 weeks. The problem occurs constantly. The problem has been gradually worsening. Pertinent negatives include no abdominal pain and no shortness of breath. Associated symptoms comments: States that he fell out of a tree 19 years ago and injured his ankle and it has hurt intermittently since. The symptoms are aggravated by walking and twisting. The symptoms are relieved by rest. Treatments tried: given colchicine and keflex 28 days ago which did not help with pain. The treatment provided no relief.    Past Medical History  Diagnosis Date  . Hypertension   . Hyperlipidemia   . Aortic regurgitation   . Mitral regurgitation   . Chest pain at rest   . SOB (shortness of breath)   . GERD (gastroesophageal reflux disease)   . Heart murmur   . Asthma    Past Surgical History  Procedure Laterality Date  . Cataract extraction, bilateral    . Cardiac catheterization    . Aortic valve replacement  05/04/2012    Procedure: AORTIC VALVE REPLACEMENT (AVR);  Surgeon: [name], MD;  Location: [facility] OR;  Service: Open Heart Surgery;  Laterality: N/A;  . Intraoperative transesophageal echocardiogram  05/04/2012    Procedure: INTRAOPERATIVE TRANSESOPHAGEAL ECHOCARDIOGRAM;  Surgeon: [name], MD;  Location: [facility] OR;  Service: Open Heart Surgery;  Laterality: N/A;   Family History  Problem Relation Age of Onset  . Heart disease  Sister    History  Substance Use Topics  . Smoking status: Former Smoker -- 0.50 packs/day for 4 years    Types: Cigarettes    Quit date: 03/30/2001  . Smokeless tobacco: Never Used     Comment: quit alcohol 03  . Alcohol Use: No     Comment: once  a week    Review of Systems  Constitutional: Negative for fever.       States fever, cough and vomiting 2 weeks ago be everything is gone now.  Respiratory: Negative for cough and shortness of breath.   Gastrointestinal: Negative for nausea, abdominal pain and diarrhea.  All other systems reviewed and are negative.    Allergies  Review of patient's allergies indicates no known allergies.  Home Medications   Current Outpatient Rx  Name  Route  Sig  Dispense  Refill  . albuterol (PROVENTIL HFA;VENTOLIN HFA) 108 (90 BASE) MCG/ACT inhaler   Inhalation   Inhale 2 puffs into the lungs every 6 (six) hours as needed. For shortness of breath         . amiodarone (PACERONE) 200 MG tablet      1. Take 2 Tablets (400 mg ) by mouth twice a day for 7 days, then 2. Take 1 Tablet (200 mg) by mouth twice a day for 7 days, then 3. Take 1 Tablet by mouth daily   90 tablet   1   . aspirin EC 81 MG tablet   Oral   Take  81 mg by mouth daily.         . cephALEXin (KEFLEX) 500 MG capsule   Oral   Take 1 capsule (500 mg total) by mouth 4 (four) times daily.   20 capsule   0   . colchicine 0.6 MG tablet      Take 2 tablets by mouth, followed by 1 tablet one hour later. Do not take more than three tablets for each gout flare   6 tablet   0   . doxazosin (CARDURA) 4 MG tablet   Oral   Take 4 mg by mouth daily.         . ferrous sulfate 325 (65 FE) MG tablet   Oral   Take 1 tablet (325 mg total) by mouth daily with breakfast. For one month then stop.         . metoprolol tartrate (LOPRESSOR) 25 MG tablet   Oral   Take 1 tablet (25 mg total) by mouth 2 (two) times daily.   60 tablet   1   . nitroGLYCERIN (NITROSTAT) 0.4 MG  SL tablet   Sublingual   Place 0.4 mg under the tongue every 5 (five) minutes x 3 doses as needed. For chest pain         . omeprazole (PRILOSEC) 20 MG capsule   Oral   Take 20 mg by mouth daily.         Marland Kitchen oxyCODONE (OXY IR/ROXICODONE) 5 MG immediate release tablet   Oral   Take 1-2 tablets (5-10 mg total) by mouth every 4 (four) hours as needed.   50 tablet   0   . pravastatin (PRAVACHOL) 20 MG tablet   Oral   Take 20 mg by mouth every evening.          BP 110/69  Pulse 63  Temp(Src) 97.6 F (36.4 C) (Oral)  Resp 20  Wt 152 lb 3 oz (69.032 kg)  SpO2 98% Physical Exam  Nursing note and vitals reviewed. Constitutional: He is oriented to person, place, and time. He appears well-developed and well-nourished. No distress.  HENT:  Head: Normocephalic and atraumatic.  Mouth/Throat: Oropharynx is clear and moist.  Eyes: Conjunctivae and EOM are normal. Pupils are equal, round, and reactive to light.  Neck: Normal range of motion. Neck supple.  Cardiovascular: Normal rate, regular rhythm and intact distal pulses.   Murmur heard. Pulmonary/Chest: Effort normal and breath sounds normal. No respiratory distress. He has no wheezes. He has no rales.  Well healed sternotomy scar  Abdominal: Soft. He exhibits no distension. There is no tenderness. There is no rebound and no guarding.  Musculoskeletal: He exhibits tenderness. He exhibits no edema.       Left ankle: He exhibits decreased range of motion and swelling. He exhibits no deformity and normal pulse. Tenderness. Lateral malleolus tenderness found.  Significant left lateral mall effusion and tenderness.  No erythema or warmth.  Normal tib/fib and knee  Neurological: He is alert and oriented to person, place, and time.  Skin: Skin is warm and dry. No rash noted. No erythema.  Psychiatric: He has a normal mood and affect. His behavior is normal.    ED Course  Procedures (including critical care time) Labs Review Labs  Reviewed - No data to display Imaging Review No results found.  EKG Interpretation    Date/Time:  Monday April 17 2013 10:49:07 EST Ventricular Rate:  60 PR Interval:  154 QRS Duration: 112 QT Interval:  428 QTC  Calculation: 428 R Axis:   27 Text Interpretation:  Atrial fibrillation Moderate voltage criteria for LVH, may be normal variant Nonspecific T wave abnormality No significant change since last tracing Confirmed by Anitra LauthPLUNKETT  MD, Spyros Winch ([id number]) on 04/17/2013 11:36:50 AM            MDM  No diagnosis found.  Patient presents today for ongoing left ankle pain and swelling. He states approximately 19 years ago he had an accident where he fell out of a tree and since that time he has had ongoing intermittent pain. He was seen in emergency department approximately 18 days ago for similar at that time he had plain films that showed a an ankle fusion but otherwise no significant findings. At that time he was felt to have gout and was given colchicine and Keflex. He took the medication and states it did not help with the pain. He is not taking any over-the-counter medications at this time.  Patient states approximately 10 days ago he had fever abdominal pain and vomiting which is now resolved and he feels fine today. The only reason he is here is for leg pain. On exam he has an effusion of his left ankle and painful to range but there is no erythema or signs of a septic joint. Lower suspicion for gout at this time. Patient given pain medication and referral to orthopedics.    Gwyneth SproutWhitney Malik Ruffino, MD 04/17/13 1217

## 2013-04-17 NOTE — ED Notes (Signed)
Pt is here with left foot pain post fall, sweating at night, fever, cough, abdominal pain.  Pt has history of OHS

## 2013-04-17 NOTE — ED Notes (Signed)
Pt states he fell from a tree 19 years ago and hurt his left ankle, he is here today because it started hurting and swelling x1 week ago.

## 2013-04-18 ENCOUNTER — Encounter (HOSPITAL_COMMUNITY): Payer: Self-pay | Admitting: Emergency Medicine

## 2013-04-18 DIAGNOSIS — Z9849 Cataract extraction status, unspecified eye: Secondary | ICD-10-CM

## 2013-04-18 DIAGNOSIS — R319 Hematuria, unspecified: Secondary | ICD-10-CM | POA: Diagnosis present

## 2013-04-18 DIAGNOSIS — J45909 Unspecified asthma, uncomplicated: Secondary | ICD-10-CM | POA: Diagnosis present

## 2013-04-18 DIAGNOSIS — K219 Gastro-esophageal reflux disease without esophagitis: Secondary | ICD-10-CM | POA: Diagnosis present

## 2013-04-18 DIAGNOSIS — Z952 Presence of prosthetic heart valve: Secondary | ICD-10-CM

## 2013-04-18 DIAGNOSIS — E785 Hyperlipidemia, unspecified: Secondary | ICD-10-CM | POA: Diagnosis present

## 2013-04-18 DIAGNOSIS — I059 Rheumatic mitral valve disease, unspecified: Secondary | ICD-10-CM | POA: Diagnosis present

## 2013-04-18 DIAGNOSIS — I129 Hypertensive chronic kidney disease with stage 1 through stage 4 chronic kidney disease, or unspecified chronic kidney disease: Secondary | ICD-10-CM | POA: Diagnosis present

## 2013-04-18 DIAGNOSIS — Z79899 Other long term (current) drug therapy: Secondary | ICD-10-CM

## 2013-04-18 DIAGNOSIS — Z8249 Family history of ischemic heart disease and other diseases of the circulatory system: Secondary | ICD-10-CM

## 2013-04-18 DIAGNOSIS — D649 Anemia, unspecified: Secondary | ICD-10-CM | POA: Diagnosis present

## 2013-04-18 DIAGNOSIS — N189 Chronic kidney disease, unspecified: Secondary | ICD-10-CM | POA: Diagnosis present

## 2013-04-18 DIAGNOSIS — Z7982 Long term (current) use of aspirin: Secondary | ICD-10-CM

## 2013-04-18 DIAGNOSIS — M109 Gout, unspecified: Secondary | ICD-10-CM | POA: Diagnosis present

## 2013-04-18 DIAGNOSIS — R569 Unspecified convulsions: Principal | ICD-10-CM | POA: Diagnosis present

## 2013-04-18 DIAGNOSIS — Z87891 Personal history of nicotine dependence: Secondary | ICD-10-CM

## 2013-04-18 LAB — CBC WITH DIFFERENTIAL/PLATELET
Basophils Absolute: 0 10*3/uL (ref 0.0–0.1)
Basophils Relative: 0 % (ref 0–1)
EOS ABS: 0.1 10*3/uL (ref 0.0–0.7)
Eosinophils Relative: 1 % (ref 0–5)
HCT: 35.8 % — ABNORMAL LOW (ref 39.0–52.0)
Hemoglobin: 12.6 g/dL — ABNORMAL LOW (ref 13.0–17.0)
LYMPHS PCT: 9 % — AB (ref 12–46)
Lymphs Abs: 0.9 10*3/uL (ref 0.7–4.0)
MCH: 29.7 pg (ref 26.0–34.0)
MCHC: 35.2 g/dL (ref 30.0–36.0)
MCV: 84.4 fL (ref 78.0–100.0)
Monocytes Absolute: 0.6 10*3/uL (ref 0.1–1.0)
Monocytes Relative: 6 % (ref 3–12)
NEUTROS PCT: 84 % — AB (ref 43–77)
Neutro Abs: 8 10*3/uL — ABNORMAL HIGH (ref 1.7–7.7)
PLATELETS: UNDETERMINED 10*3/uL (ref 150–400)
RBC: 4.24 MIL/uL (ref 4.22–5.81)
RDW: 12.9 % (ref 11.5–15.5)
WBC: 9.6 10*3/uL (ref 4.0–10.5)

## 2013-04-18 LAB — COMPREHENSIVE METABOLIC PANEL
ALT: 75 U/L — AB (ref 0–53)
AST: 62 U/L — AB (ref 0–37)
Albumin: 3 g/dL — ABNORMAL LOW (ref 3.5–5.2)
Alkaline Phosphatase: 173 U/L — ABNORMAL HIGH (ref 39–117)
BUN: 14 mg/dL (ref 6–23)
CO2: 22 mEq/L (ref 19–32)
Calcium: 8.5 mg/dL (ref 8.4–10.5)
Chloride: 99 mEq/L (ref 96–112)
Creatinine, Ser: 1.46 mg/dL — ABNORMAL HIGH (ref 0.50–1.35)
GFR calc non Af Amer: 47 mL/min — ABNORMAL LOW (ref >90)
GFR, EST AFRICAN AMERICAN: 54 mL/min — AB (ref >90)
Glucose, Bld: 107 mg/dL — ABNORMAL HIGH (ref 70–99)
POTASSIUM: 3.9 meq/L (ref 3.7–5.3)
SODIUM: 135 meq/L — AB (ref 137–147)
TOTAL PROTEIN: 7.2 g/dL (ref 6.0–8.3)
Total Bilirubin: 1.3 mg/dL — ABNORMAL HIGH (ref 0.3–1.2)

## 2013-04-18 NOTE — ED Notes (Signed)
Pt.'s family reported that pt. hasn't voided today with bladder pressure / urinary urgency .

## 2013-04-19 ENCOUNTER — Emergency Department (HOSPITAL_COMMUNITY): Payer: Medicaid Other

## 2013-04-19 ENCOUNTER — Encounter (HOSPITAL_COMMUNITY): Payer: Self-pay | Admitting: Radiology

## 2013-04-19 ENCOUNTER — Inpatient Hospital Stay (HOSPITAL_COMMUNITY): Payer: Medicaid Other

## 2013-04-19 ENCOUNTER — Inpatient Hospital Stay (HOSPITAL_COMMUNITY)
Admission: EM | Admit: 2013-04-19 | Discharge: 2013-04-20 | DRG: 101 | Disposition: A | Discharge status: Home / Self Care | Payer: Medicaid Other | Attending: Internal Medicine | Admitting: Internal Medicine

## 2013-04-19 DIAGNOSIS — E785 Hyperlipidemia, unspecified: Secondary | ICD-10-CM

## 2013-04-19 DIAGNOSIS — Z952 Presence of prosthetic heart valve: Secondary | ICD-10-CM

## 2013-04-19 DIAGNOSIS — R569 Unspecified convulsions: Secondary | ICD-10-CM | POA: Diagnosis present

## 2013-04-19 DIAGNOSIS — I4891 Unspecified atrial fibrillation: Secondary | ICD-10-CM

## 2013-04-19 DIAGNOSIS — I10 Essential (primary) hypertension: Secondary | ICD-10-CM | POA: Diagnosis present

## 2013-04-19 DIAGNOSIS — Z954 Presence of other heart-valve replacement: Secondary | ICD-10-CM

## 2013-04-19 DIAGNOSIS — M109 Gout, unspecified: Secondary | ICD-10-CM | POA: Diagnosis present

## 2013-04-19 HISTORY — DX: Chronic kidney disease, unspecified: N18.9

## 2013-04-19 HISTORY — DX: Unspecified convulsions: R56.9

## 2013-04-19 LAB — HEPATITIS PANEL, ACUTE
HCV Ab: NEGATIVE
Hep A IgM: NONREACTIVE
Hep B C IgM: NONREACTIVE
Hepatitis B Surface Ag: NEGATIVE

## 2013-04-19 LAB — CBC WITH DIFFERENTIAL/PLATELET
BASOS PCT: 0 % (ref 0–1)
Basophils Absolute: 0 10*3/uL (ref 0.0–0.1)
EOS ABS: 0.1 10*3/uL (ref 0.0–0.7)
Eosinophils Relative: 2 % (ref 0–5)
HCT: 32.5 % — ABNORMAL LOW (ref 39.0–52.0)
Hemoglobin: 11.2 g/dL — ABNORMAL LOW (ref 13.0–17.0)
Lymphocytes Relative: 18 % (ref 12–46)
Lymphs Abs: 1.1 10*3/uL (ref 0.7–4.0)
MCH: 29.5 pg (ref 26.0–34.0)
MCHC: 34.5 g/dL (ref 30.0–36.0)
MCV: 85.5 fL (ref 78.0–100.0)
Monocytes Absolute: 0.6 10*3/uL (ref 0.1–1.0)
Monocytes Relative: 10 % (ref 3–12)
NEUTROS PCT: 69 % (ref 43–77)
Neutro Abs: 4.2 10*3/uL (ref 1.7–7.7)
PLATELETS: 211 10*3/uL (ref 150–400)
RBC: 3.8 MIL/uL — AB (ref 4.22–5.81)
RDW: 12.5 % (ref 11.5–15.5)
WBC: 6.1 10*3/uL (ref 4.0–10.5)

## 2013-04-19 LAB — URINALYSIS, ROUTINE W REFLEX MICROSCOPIC
GLUCOSE, UA: NEGATIVE mg/dL
Ketones, ur: NEGATIVE mg/dL
Leukocytes, UA: NEGATIVE
NITRITE: NEGATIVE
PH: 5.5 (ref 5.0–8.0)
Protein, ur: >300 mg/dL — AB
SPECIFIC GRAVITY, URINE: 1.023 (ref 1.005–1.030)
Urobilinogen, UA: 0.2 mg/dL (ref 0.0–1.0)

## 2013-04-19 LAB — COMPREHENSIVE METABOLIC PANEL
ALT: 59 U/L — AB (ref 0–53)
AST: 41 U/L — AB (ref 0–37)
Albumin: 2.8 g/dL — ABNORMAL LOW (ref 3.5–5.2)
Alkaline Phosphatase: 146 U/L — ABNORMAL HIGH (ref 39–117)
BILIRUBIN TOTAL: 1.8 mg/dL — AB (ref 0.3–1.2)
BUN: 14 mg/dL (ref 6–23)
CHLORIDE: 99 meq/L (ref 96–112)
CO2: 24 mEq/L (ref 19–32)
CREATININE: 1.47 mg/dL — AB (ref 0.50–1.35)
Calcium: 8.6 mg/dL (ref 8.4–10.5)
GFR, EST AFRICAN AMERICAN: 54 mL/min — AB (ref >90)
GFR, EST NON AFRICAN AMERICAN: 46 mL/min — AB (ref >90)
GLUCOSE: 92 mg/dL (ref 70–99)
Potassium: 3.7 mEq/L (ref 3.7–5.3)
Sodium: 138 mEq/L (ref 137–147)
Total Protein: 6.5 g/dL (ref 6.0–8.3)

## 2013-04-19 LAB — LACTIC ACID, PLASMA: Lactic Acid, Venous: 1.4 mmol/L (ref 0.5–2.2)

## 2013-04-19 LAB — ACETAMINOPHEN LEVEL

## 2013-04-19 LAB — URINE MICROSCOPIC-ADD ON

## 2013-04-19 LAB — INFLUENZA PANEL BY PCR (TYPE A & B)
H1N1 flu by pcr: NOT DETECTED
INFLAPCR: NEGATIVE
Influenza B By PCR: NEGATIVE

## 2013-04-19 LAB — URIC ACID: Uric Acid, Serum: 8.1 mg/dL — ABNORMAL HIGH (ref 4.0–7.8)

## 2013-04-19 LAB — BILIRUBIN, DIRECT: Bilirubin, Direct: 0.4 mg/dL — ABNORMAL HIGH (ref 0.0–0.3)

## 2013-04-19 LAB — TSH: TSH: 0.846 u[IU]/mL (ref 0.350–4.500)

## 2013-04-19 MED ORDER — ONDANSETRON HCL 4 MG/2ML IJ SOLN: 4.0000 mg | Freq: Four times a day (QID) | INTRAMUSCULAR | Status: DC | PRN

## 2013-04-19 MED ORDER — ONDANSETRON HCL 4 MG PO TABS: 4.0000 mg | ORAL_TABLET | Freq: Four times a day (QID) | ORAL | Status: DC | PRN

## 2013-04-19 MED ORDER — SODIUM CHLORIDE 0.9 % IJ SOLN
3.0000 mL | Freq: Two times a day (BID) | INTRAMUSCULAR | Status: DC
  Administered 2013-04-19 – 2013-04-20 (×3): 3 mL via INTRAVENOUS

## 2013-04-19 MED ORDER — ENOXAPARIN SODIUM 40 MG/0.4ML ~~LOC~~ SOLN
40.0000 mg | SUBCUTANEOUS | Status: DC
  Administered 2013-04-19 – 2013-04-20 (×2): 40 mg via SUBCUTANEOUS
  Filled 2013-04-19 (×2): qty 0.4

## 2013-04-19 MED ORDER — SIMVASTATIN 10 MG PO TABS
10.0000 mg | ORAL_TABLET | Freq: Every day | ORAL | Status: DC
  Administered 2013-04-19: 10 mg via ORAL
  Filled 2013-04-19 (×2): qty 1

## 2013-04-19 MED ORDER — ALBUTEROL SULFATE (2.5 MG/3ML) 0.083% IN NEBU: 3.0000 mL | INHALATION_SOLUTION | Freq: Four times a day (QID) | RESPIRATORY_TRACT | Status: DC | PRN

## 2013-04-19 MED ORDER — PANTOPRAZOLE SODIUM 40 MG PO TBEC
40.0000 mg | DELAYED_RELEASE_TABLET | Freq: Every day | ORAL | Status: DC
  Administered 2013-04-19 – 2013-04-20 (×2): 40 mg via ORAL
  Filled 2013-04-19 (×2): qty 1

## 2013-04-19 MED ORDER — DOXAZOSIN MESYLATE 4 MG PO TABS
4.0000 mg | ORAL_TABLET | Freq: Every day | ORAL | Status: DC
  Administered 2013-04-19 – 2013-04-20 (×2): 4 mg via ORAL
  Filled 2013-04-19 (×2): qty 1

## 2013-04-19 MED ORDER — ACETAMINOPHEN 325 MG PO TABS: 650.0000 mg | ORAL_TABLET | Freq: Four times a day (QID) | ORAL | Status: DC | PRN

## 2013-04-19 MED ORDER — SODIUM CHLORIDE 0.9 % IV SOLN: INTRAVENOUS | Status: DC

## 2013-04-19 MED ORDER — ACETAMINOPHEN 650 MG RE SUPP: 650.0000 mg | Freq: Four times a day (QID) | RECTAL | Status: DC | PRN

## 2013-04-19 MED ORDER — ASPIRIN EC 81 MG PO TBEC
81.0000 mg | DELAYED_RELEASE_TABLET | Freq: Every day | ORAL | Status: DC
  Administered 2013-04-19 – 2013-04-20 (×2): 81 mg via ORAL
  Filled 2013-04-19 (×2): qty 1

## 2013-04-19 MED ORDER — OXYCODONE HCL 5 MG PO TABS: 5.0000 mg | ORAL_TABLET | Freq: Four times a day (QID) | ORAL | Status: DC | PRN

## 2013-04-19 MED ORDER — METOPROLOL TARTRATE 25 MG PO TABS
25.0000 mg | ORAL_TABLET | Freq: Two times a day (BID) | ORAL | Status: DC
  Administered 2013-04-19 – 2013-04-20 (×3): 25 mg via ORAL
  Filled 2013-04-19 (×4): qty 1

## 2013-04-19 MED ORDER — NITROGLYCERIN 0.4 MG SL SUBL: 0.4000 mg | SUBLINGUAL_TABLET | SUBLINGUAL | Status: DC | PRN

## 2013-04-19 MED ORDER — LORAZEPAM 2 MG/ML IJ SOLN: 1.0000 mg | INTRAMUSCULAR | Status: DC | PRN

## 2013-04-19 NOTE — ED Notes (Signed)
Family says that patient had seizures, 2 yesterday during day,

## 2013-04-19 NOTE — Progress Notes (Signed)
UR Completed Adeli Frost Graves-Bigelow, RN,BSN [phone]  

## 2013-04-19 NOTE — ED Provider Notes (Signed)
CSN: [id number]     Arrival date & time 04/18/13  1839 History   First MD Initiated Contact with Patient 04/19/13 0224     Chief Complaint  Patient presents with  . Urinary Retention   (Consider location/radiation/quality/duration/timing/severity/associated sxs/prior Treatment) Patient is a 73 y.o. male presenting with seizures.  Seizures Seizure type:  Grand mal Episode characteristics: abnormal movements, disorientation and generalized shaking   Postictal symptoms: confusion   Return to baseline: yes   Severity:  Severe Duration: 5-10 minutes. Number of seizures this episode:  2 Progression:  Resolved Context: fever (tactile)   History of seizures: no     Past Medical History  Diagnosis Date  . Hypertension   . Hyperlipidemia   . Aortic regurgitation   . Mitral regurgitation   . Chest pain at rest   . SOB (shortness of breath)   . GERD (gastroesophageal reflux disease)   . Heart murmur   . Asthma    Past Surgical History  Procedure Laterality Date  . Cataract extraction, bilateral    . Cardiac catheterization    . Aortic valve replacement  05/04/2012    Procedure: AORTIC VALVE REPLACEMENT (AVR);  Surgeon: Alleen BorneBryan K Bartle, MD;  [facility];  Service: Open Heart Surgery;  Laterality: N/A;  . Intraoperative transesophageal echocardiogram  05/04/2012    Procedure: INTRAOPERATIVE TRANSESOPHAGEAL ECHOCARDIOGRAM;  Surgeon: Alleen BorneBryan K Bartle, MD;  [facility];  Service: Open Heart Surgery;  Laterality: N/A;   Family History  Problem Relation Age of Onset  . Heart disease Sister    History  Substance Use Topics  . Smoking status: Former Smoker -- 0.50 packs/day for 4 years    Types: Cigarettes    Quit date: 03/30/2001  . Smokeless tobacco: Never Used     Comment: quit alcohol 03  . Alcohol Use: No     Comment: once  a week    Review of Systems  HENT: Negative for congestion.   Respiratory: Negative for cough and shortness of breath.   Cardiovascular: Negative for  chest pain.  Gastrointestinal: Negative for nausea, vomiting, abdominal pain and diarrhea.  Neurological: Positive for seizures.  All other systems reviewed and are negative.    Allergies  Review of patient's allergies indicates no known allergies.  Home Medications   Current Outpatient Rx  Name  Route  Sig  Dispense  Refill  . albuterol (PROVENTIL HFA;VENTOLIN HFA) 108 (90 BASE) MCG/ACT inhaler   Inhalation   Inhale 2 puffs into the lungs every 6 (six) hours as needed. For shortness of breath         . aspirin EC 81 MG tablet   Oral   Take 81 mg by mouth daily.         Marland Kitchen. doxazosin (CARDURA) 4 MG tablet   Oral   Take 4 mg by mouth daily.         . metoprolol tartrate (LOPRESSOR) 25 MG tablet   Oral   Take 1 tablet (25 mg total) by mouth 2 (two) times daily.   60 tablet   1   . nitroGLYCERIN (NITROSTAT) 0.4 MG SL tablet   Sublingual   Place 0.4 mg under the tongue every 5 (five) minutes x 3 doses as needed. For chest pain         . omeprazole (PRILOSEC) 20 MG capsule   Oral   Take 20 mg by mouth 2 (two) times daily.          Marland Kitchen. oxyCODONE (  ROXICODONE) 5 MG immediate release tablet   Oral   Take 1 tablet (5 mg total) by mouth every 6 (six) hours as needed for severe pain.   20 tablet   0   . pravastatin (PRAVACHOL) 20 MG tablet   Oral   Take 20 mg by mouth every evening.          BP 107/76  Pulse 77  Temp(Src) 97.9 F (36.6 C) (Oral)  Resp 21  Ht 5\' 5"  (1.651 m)  Wt 155 lb (70.308 kg)  BMI 25.79 kg/m2  SpO2 93% Physical Exam  Nursing note and vitals reviewed. Constitutional: He is oriented to person, place, and time. He appears well-developed and well-nourished. No distress.  HENT:  Head: Normocephalic and atraumatic.  Mouth/Throat: Oropharynx is clear and moist.  Eyes: Conjunctivae are normal. Pupils are equal, round, and reactive to light. No scleral icterus.  Neck: Neck supple.  Cardiovascular: Normal rate, regular rhythm, normal heart  sounds and intact distal pulses.   No murmur heard. Pulmonary/Chest: Effort normal and breath sounds normal. No stridor. No respiratory distress. He has no wheezes. He has no rales.  Abdominal: Soft. He exhibits no distension. There is no tenderness.  Musculoskeletal: Normal range of motion. He exhibits no edema.  Neurological: He is alert and oriented to person, place, and time. He has normal strength. A cranial nerve deficit (mild right sided blunting of nasolabial fold, otherwise normal) is present. No sensory deficit. Coordination normal. GCS eye subscore is 4. GCS verbal subscore is 5. GCS motor subscore is 6.  Skin: Skin is warm and dry. No rash noted.  Psychiatric: He has a normal mood and affect. His behavior is normal.    ED Course  Procedures (including critical care time) Labs Review Labs Reviewed  CBC WITH DIFFERENTIAL - Abnormal; Notable for the following:    Hemoglobin 12.6 (*)    HCT 35.8 (*)    Neutrophils Relative % 84 (*)    Lymphocytes Relative 9 (*)    Neutro Abs 8.0 (*)    All other components within normal limits  COMPREHENSIVE METABOLIC PANEL - Abnormal; Notable for the following:    Sodium 135 (*)    Glucose, Bld 107 (*)    Creatinine, Ser 1.46 (*)    Albumin 3.0 (*)    AST 62 (*)    ALT 75 (*)    Alkaline Phosphatase 173 (*)    Total Bilirubin 1.3 (*)    GFR calc non Af Amer 47 (*)    GFR calc Af Amer 54 (*)    All other components within normal limits  LACTIC ACID, PLASMA  URINALYSIS, ROUTINE W REFLEX MICROSCOPIC  URINALYSIS, ROUTINE W REFLEX MICROSCOPIC   Imaging Review Ct Head Wo Contrast  04/19/2013   CLINICAL DATA:  Two seizures.  EXAM: CT HEAD WITHOUT CONTRAST  TECHNIQUE: Contiguous axial images were obtained from the base of the skull through the vertex without intravenous contrast.  COMPARISON:  None.  FINDINGS: There is no evidence of acute infarction, mass lesion, or intra- or extra-axial hemorrhage on CT.  Prominence of the sulci suggest mild  cortical volume loss. Scattered periventricular and subcortical white matter change likely reflects small vessel ischemic microangiopathy. Mild cerebellar atrophy is noted.  The brainstem and fourth ventricle are within normal limits. The basal ganglia are unremarkable in appearance. The cerebral hemispheres demonstrate grossly normal gray-white differentiation. No mass effect or midline shift is seen.  There is no evidence of fracture; visualized osseous structures  are unremarkable in appearance. The orbits are within normal limits. Mucoperiosteal thickening is noted within the maxillary sinuses bilaterally. The remaining paranasal sinuses and mastoid air cells are well-aerated. No significant soft tissue abnormalities are seen.  IMPRESSION: 1. No acute intracranial pathology seen on CT. 2. Mild cortical volume loss and scattered small vessel ischemic microangiopathy. 3. Mucoperiosteal thickening within the maxillary sinuses bilaterally.   Electronically Signed   By: Roanna Raider M.D.   On: 04/19/2013 03:41   Dg Chest Port 1 View  04/19/2013   CLINICAL DATA:  Fever; seizure.  EXAM: PORTABLE CHEST - 1 VIEW  COMPARISON:  Chest radiograph performed 03/30/2013  FINDINGS: The lungs are well-aerated. Pulmonary vascularity is at the upper limits of normal. Mild left basilar opacity likely reflects atelectasis. There is no evidence of pleural effusion or pneumothorax.  The cardiomediastinal silhouette is mildly enlarged. The patient is status post median sternotomy. No acute osseous abnormalities are seen.  IMPRESSION: Mild left basilar airspace opacity likely reflects atelectasis. Lungs otherwise clear. Mild cardiomegaly.   Electronically Signed   By: Roanna Raider M.D.   On: 04/19/2013 03:26  All radiology studies independently viewed by me.      EKG Interpretation    Date/Time:    Ventricular Rate:    PR Interval:    QRS Duration:   QT Interval:    QTC Calculation:   R Axis:     Text Interpretation:               MDM   1. Seizure-like activity   2. Hyperlipidemia   3. Hypertension   4. S/P AVR (aortic valve replacement)   5. Seizures    72 yo male presenting with chief complaint of 2 seizures in past 24 hours.  History provided mostly by son.  Episodes were witnessed, had generalized shaking which lasted somewhere between 5-10 minutes.  Unclear whether he was responsive during episodes.  No prior history of seizures.    ED workup largely unrevealing including negative head CT.  Neurology consulted due to seizures and facial drooping.  Hospitalists will admit.      Candyce Churn, MD 04/19/13 [phone]

## 2013-04-19 NOTE — ED Notes (Signed)
Family at bedside. 

## 2013-04-19 NOTE — Progress Notes (Signed)
EEG completed at bedside 

## 2013-04-19 NOTE — Progress Notes (Signed)
   CARE MANAGEMENT NOTE 04/19/2013  Patient:  Percell BeltGURUNG,Samual B   Account Number:  [id number]  Date Initiated:  04/19/2013  Documentation initiated by:  GRAVES-BIGELOW,Krystian Younglove  Subjective/Objective Assessment:   Pt admitted for new onset seizures.     Action/Plan:   CM will continue to monitor for disposition needs.   Anticipated DC Date:  04/20/2013   Anticipated DC Plan:  HOME W HOME HEALTH SERVICES      DC Planning Services  CM consult      Choice offered to / List presented to:             Status of service:  In process, will continue to follow Medicare Important Message given?   (If response is "NO", the following Medicare IM given date fields will be blank) Date Medicare IM given:   Date Additional Medicare IM given:    Discharge Disposition:    Per UR Regulation:  Reviewed for med. necessity/level of care/duration of stay  If discussed at Long Length of Stay Meetings, dates discussed:    Comments:

## 2013-04-19 NOTE — Procedures (Signed)
History: 73 yo M with new onset seizures  Sedation: None  Background: The background consists of intermixed alpha and beta activities. There is a well defined posterior dominant rhythm of 8.5 Hz that attenuates with eye opening. There is an increase in slow activity with drowsiness and sleep structures are observed.   Photic stimulation: Physiologic driving is not performed  EEG Abnormalities: None  Clinical Interpretation: This normal EEG is recorded in the waking and sleep state. There was no seizure or seizure predisposition recorded on this study.   Ritta SlotMcNeill Sachi Boulay, MD [facility] [phone]  If 7pm- 7am, please page neurology on call at [phone].

## 2013-04-19 NOTE — Progress Notes (Signed)
H and P from five hours ago reviewed. Agree with assessment and plan as outlined. Pt with suspected new seizures. Neurology consulted. Studies pending. Will follow. Currently stable.

## 2013-04-19 NOTE — ED Notes (Signed)
Patients family stated patient urinated in waiting room restroom around 2300 hrs. Patient reported no painful urination at that time.

## 2013-04-19 NOTE — ED Notes (Signed)
According to family pt experienced two seizures yesterday around 1730

## 2013-04-19 NOTE — Consult Note (Addendum)
NEURO HOSPITALIST CONSULT NOTE    Reason for Consult: new onset seizures.  HPI:                                                                                                                                          Ryan Burke is an 74 y.o. male, right handed, with a past medical history significant for HTN, hyperlipidemia, s/p aortic valve replacement, gout, asthma, brought in today due to new onset seizures. There is a language barrier and thus his children help with translation. They indicated that Mr. Shatto was in his usual state of health until yesterday morning when he sustained a generalized convulsion witnessed by his wife. Then, he had another seizure same day in the afternoon. The seizures lasted for few minutes and afterwards he was confused and amnestic for the events. He said that both seizures were preceded by a " strong bitter taste in my mouth". In retrospect, he recalls experiencing the same bitter taste in the recent past.  No recent fever, chills, head trauma, HA, vertigo, double vision, focal weakness or numbness, slurred speech, language or vision impairment. He started taking oxycodone for gout related pain just the day before the seizures. Denies alcohol intake or use of illicit drugs. No history of stroke, severe head trauma, or stroke. No family history of epilepsy. At this moment, he is back to his baseline.  Past Medical History  Diagnosis Date  . Hypertension   . Hyperlipidemia   . Aortic regurgitation   . Mitral regurgitation   . Chest pain at rest   . SOB (shortness of breath)   . GERD (gastroesophageal reflux disease)   . Heart murmur   . Asthma     Past Surgical History  Procedure Laterality Date  . Cataract extraction, bilateral    . Cardiac catheterization    . Aortic valve replacement  05/04/2012    Procedure: AORTIC VALVE REPLACEMENT (AVR);  Surgeon: Gaye Pollack, MD;  Location: [facility];  Service: Open Heart Surgery;   Laterality: N/A;  . Intraoperative transesophageal echocardiogram  05/04/2012    Procedure: INTRAOPERATIVE TRANSESOPHAGEAL ECHOCARDIOGRAM;  Surgeon: Gaye Pollack, MD;  Location: [facility] OR;  Service: Open Heart Surgery;  Laterality: N/A;    Family History  Problem Relation Age of Onset  . Heart disease Sister     Social History:  reports that he quit smoking about 12 years ago. His smoking use included Cigarettes. He has a 2 pack-year smoking history. He has never used smokeless tobacco. He reports that he does not drink alcohol or use illicit drugs.  No Known Allergies  MEDICATIONS:  I have reviewed the patient's current medications.   ROS:                                                                                                                                       History obtained from the patient, family, and chart review.  General ROS: negative for - chills, fatigue, fever, night sweats, weight gain or weight loss Psychological ROS: negative for - behavioral disorder, hallucinations, memory difficulties, mood swings or suicidal ideation Ophthalmic ROS: negative for - blurry vision, double vision, eye pain or loss of vision ENT ROS: negative for - epistaxis, nasal discharge, oral lesions, sore throat, tinnitus or vertigo Allergy and Immunology ROS: negative for - hives or itchy/watery eyes Hematological and Lymphatic ROS: negative for - bleeding problems, bruising or swollen lymph nodes Endocrine ROS: negative for - galactorrhea, hair pattern changes, polydipsia/polyuria or temperature intolerance Respiratory ROS: negative for - cough, hemoptysis, shortness of breath or wheezing Cardiovascular ROS: negative for - chest pain, dyspnea on exertion, edema or irregular heartbeat Gastrointestinal ROS: negative for - abdominal pain, diarrhea, hematemesis, nausea/vomiting or  stool incontinence Genito-Urinary ROS: negative for - dysuria, hematuria, incontinence or urinary frequency/urgency Musculoskeletal ROS: negative for - joint swelling or muscular weakness Neurological ROS: as noted in HPI Dermatological ROS: negative for rash and skin lesion changes   Physical exam: pleasant male in no apparent distress. Blood pressure 107/76, pulse 77, temperature 99.3 F (37.4 C), temperature source Rectal, resp. rate 21, height '5\' 5"'  (1.651 m), weight 70.308 kg (155 lb), SpO2 93.00%. Head: normocephalic. Neck: supple, no bruits, no JVD. Cardiac: no murmurs. Lungs: clear. Abdomen: soft, no tender, no mass. Extremities: no edema.  Neurologic Examination:                                                                                                      Mental Status: Alert, oriented, thought content appropriate.  Speech fluent without evidence of aphasia.  Able to follow 3 step commands without difficulty. Cranial Nerves: II: Discs flat bilaterally; Visual fields grossly normal, pupils equal, round, reactive to light and accommodation III,IV, VI: ptosis not present, extra-ocular motions intact bilaterally V,VII: smile mildly asymmetric with left nasolabial fold flattening, facial light touch sensation normal bilaterally VIII: hearing normal bilaterally IX,X: gag reflex present XI: bilateral shoulder shrug XII: midline tongue extension without atrophy or fasciculations  Motor: Right : Upper extremity   5/5    Left:     Upper extremity   5/5  Lower extremity   5/5     Lower extremity   5/5 Tone and bulk:normal tone throughout; no atrophy noted Sensory: Pinprick and light touch intact throughout, bilaterally Deep Tendon Reflexes:  Right: Upper Extremity   Left: Upper extremity   biceps (C-5 to C-6) 2/4   biceps (C-5 to C-6) 2/4 tricep (C7) 2/4    triceps (C7) 2/4 Brachioradialis (C6) 2/4  Brachioradialis (C6) 2/4  Lower Extremity Lower Extremity  quadriceps  (L-2 to L-4) 2/4   quadriceps (L-2 to L-4) 2/4 Achilles (S1) 2/4   Achilles (S1) 2/4  Plantars: Right: downgoing   Left: downgoing Cerebellar: normal finger-to-nose,  normal heel-to-shin test Gait:  No tested. CV: pulses palpable throughout    Lab Results  Component Value Date/Time   CHOL 203* 07/29/2009 11:40 PM    Results for orders placed during the hospital encounter of 04/19/13 (from the past 48 hour(s))  CBC WITH DIFFERENTIAL     Status: Abnormal   Collection Time    04/18/13  7:31 PM      Result Value Range   WBC 9.6  4.0 - 10.5 K/uL   RBC 4.24  4.22 - 5.81 MIL/uL   Hemoglobin 12.6 (*) 13.0 - 17.0 g/dL   HCT 35.8 (*) 39.0 - 52.0 %   MCV 84.4  78.0 - 100.0 fL   MCH 29.7  26.0 - 34.0 pg   MCHC 35.2  30.0 - 36.0 g/dL   RDW 12.9  11.5 - 15.5 %   Platelets PLATELET CLUMPS NOTED ON SMEAR, UNABLE TO ESTIMATE  150 - 400 K/uL   Comment: FIBRIN STRANDS NOTED   Neutrophils Relative % 84 (*) 43 - 77 %   Lymphocytes Relative 9 (*) 12 - 46 %   Monocytes Relative 6  3 - 12 %   Eosinophils Relative 1  0 - 5 %   Basophils Relative 0  0 - 1 %   Neutro Abs 8.0 (*) 1.7 - 7.7 K/uL   Lymphs Abs 0.9  0.7 - 4.0 K/uL   Monocytes Absolute 0.6  0.1 - 1.0 K/uL   Eosinophils Absolute 0.1  0.0 - 0.7 K/uL   Basophils Absolute 0.0  0.0 - 0.1 K/uL   Smear Review MORPHOLOGY UNREMARKABLE    COMPREHENSIVE METABOLIC PANEL     Status: Abnormal   Collection Time    04/18/13  7:31 PM      Result Value Range   Sodium 135 (*) 137 - 147 mEq/L   Potassium 3.9  3.7 - 5.3 mEq/L   Chloride 99  96 - 112 mEq/L   CO2 22  19 - 32 mEq/L   Glucose, Bld 107 (*) 70 - 99 mg/dL   BUN 14  6 - 23 mg/dL   Creatinine, Ser 1.46 (*) 0.50 - 1.35 mg/dL   Calcium 8.5  8.4 - 10.5 mg/dL   Total Protein 7.2  6.0 - 8.3 g/dL   Albumin 3.0 (*) 3.5 - 5.2 g/dL   AST 62 (*) 0 - 37 U/L   ALT 75 (*) 0 - 53 U/L   Alkaline Phosphatase 173 (*) 39 - 117 U/L   Total Bilirubin 1.3 (*) 0.3 - 1.2 mg/dL   GFR calc non Af Amer 47 (*)  >90 mL/min   GFR calc Af Amer 54 (*) >90 mL/min   Comment: (NOTE)     The eGFR has been calculated using the CKD EPI equation.     This calculation has not been validated in all  clinical situations.     eGFR's persistently <90 mL/min signify possible Chronic Kidney     Disease.  LACTIC ACID, PLASMA     Status: None   Collection Time    04/19/13  2:48 AM      Result Value Range   Lactic Acid, Venous 1.4  0.5 - 2.2 mmol/L  URINALYSIS, ROUTINE W REFLEX MICROSCOPIC     Status: Abnormal   Collection Time    04/19/13  3:55 AM      Result Value Range   Color, Urine AMBER (*) YELLOW   Comment: BIOCHEMICALS MAY BE AFFECTED BY COLOR   APPearance CLOUDY (*) CLEAR   Specific Gravity, Urine 1.023  1.005 - 1.030   pH 5.5  5.0 - 8.0   Glucose, UA NEGATIVE  NEGATIVE mg/dL   Hgb urine dipstick LARGE (*) NEGATIVE   Bilirubin Urine SMALL (*) NEGATIVE   Ketones, ur NEGATIVE  NEGATIVE mg/dL   Protein, ur >300 (*) NEGATIVE mg/dL   Urobilinogen, UA 0.2  0.0 - 1.0 mg/dL   Nitrite NEGATIVE  NEGATIVE   Leukocytes, UA NEGATIVE  NEGATIVE  URINE MICROSCOPIC-ADD ON     Status: Abnormal   Collection Time    04/19/13  3:55 AM      Result Value Range   Squamous Epithelial / LPF RARE  RARE   WBC, UA 0-2  <3 WBC/hpf   RBC / HPF 7-10  <3 RBC/hpf   Bacteria, UA FEW (*) RARE   Casts HYALINE CASTS (*) NEGATIVE    Ct Head Wo Contrast  04/19/2013   CLINICAL DATA:  Two seizures.  EXAM: CT HEAD WITHOUT CONTRAST  TECHNIQUE: Contiguous axial images were obtained from the base of the skull through the vertex without intravenous contrast.  COMPARISON:  None.  FINDINGS: There is no evidence of acute infarction, mass lesion, or intra- or extra-axial hemorrhage on CT.  Prominence of the sulci suggest mild cortical volume loss. Scattered periventricular and subcortical white matter change likely reflects small vessel ischemic microangiopathy. Mild cerebellar atrophy is noted.  The brainstem and fourth ventricle are within  normal limits. The basal ganglia are unremarkable in appearance. The cerebral hemispheres demonstrate grossly normal gray-white differentiation. No mass effect or midline shift is seen.  There is no evidence of fracture; visualized osseous structures are unremarkable in appearance. The orbits are within normal limits. Mucoperiosteal thickening is noted within the maxillary sinuses bilaterally. The remaining paranasal sinuses and mastoid air cells are well-aerated. No significant soft tissue abnormalities are seen.  IMPRESSION: 1. No acute intracranial pathology seen on CT. 2. Mild cortical volume loss and scattered small vessel ischemic microangiopathy. 3. Mucoperiosteal thickening within the maxillary sinuses bilaterally.   Electronically Signed   By: Garald Balding M.D.   On: 04/19/2013 03:41   Dg Chest Port 1 View  04/19/2013   CLINICAL DATA:  Fever; seizure.  EXAM: PORTABLE CHEST - 1 VIEW  COMPARISON:  Chest radiograph performed 03/30/2013  FINDINGS: The lungs are well-aerated. Pulmonary vascularity is at the upper limits of normal. Mild left basilar opacity likely reflects atelectasis. There is no evidence of pleural effusion or pneumothorax.  The cardiomediastinal silhouette is mildly enlarged. The patient is status post median sternotomy. No acute osseous abnormalities are seen.  IMPRESSION: Mild left basilar airspace opacity likely reflects atelectasis. Lungs otherwise clear. Mild cardiomegaly.   Electronically Signed   By: Garald Balding M.D.   On: 04/19/2013 03:26   Assessment/Plan: 73 y/o brought in with new onset GTC  seizures preceded by " a bitter taste". ? Left nasolabial folding flattening on neuro-exam but no acute intracranial pathology seen on CT. Suspect partial onset seizure with secondary generalization. Recommend: 1) admit to medicine. 2) MRI brain without contrast ( can not have contrast due to kidney disease). 3) EEG. 4) Defer anti-seizure medication pending results seizure work  up. Will follow up.   Dorian Pod, MD 04/19/2013, 5:03 AM

## 2013-04-19 NOTE — H&P (Addendum)
[facility] History and Physical  Ryan Burke ZOX:[id number] DOB: [birth year] DOA: 04/19/2013  Referring physician: ER physician. PCP: No PCP Per [facility]. Specialists: Dr. Jacinto Halim. Cardiologist.  Chief Complaint: Seizures.  History obtained from. Patient's son acting as a Nurse, learning disability.  HPI: Ryan Burke is a 73 y.o. male history of aortic valve replacement, tissue valve, hypertension hyperlipidemia and chronic kidney disease was brought to the ER patient is to have a second seizure by patient's family. As per patient's son patient had a seizure morning witnessed by patient's wife which lasted for around one minute which was generalized tonic-clonic and patient had was consciousness after seizure. Last evening around 5 PM when patient's son was at home patient had another seizure lasted for around 3-minutes which was also generalized tonic-clonic patient was consciousness after the seizure. Patient was brought to the ER patient was found to be nonfocal CT head did not show anything acute and on call neurologist has been consulted and patient admitted for further workup. Patient otherwise denies any chest pain shortness of breath nausea vomiting abdominal pain diarrhea. Patient has had some left foot pain since last month and was placed on oxycodone 2 days ago by the ER physician for pain. Patient had come to the ER 3 weeks ago for similar complaints and was placed on Keflex and colchicine which did not help him much.  Review of Systems: As presented in the history of presenting illness, rest negative.  Past Medical History  Diagnosis Date  . Hypertension   . Hyperlipidemia   . Aortic regurgitation   . Mitral regurgitation   . Chest pain at rest   . SOB (shortness of breath)   . GERD (gastroesophageal reflux disease)   . Heart murmur   . Asthma    Past Surgical History  Procedure Laterality Date  . Cataract extraction, bilateral    . Cardiac catheterization    . Aortic  valve replacement  05/04/2012    Procedure: AORTIC VALVE REPLACEMENT (AVR);  Surgeon: [name], MD;  Location: [facility] OR;  Service: Open Heart Surgery;  Laterality: N/A;  . Intraoperative transesophageal echocardiogram  05/04/2012    Procedure: INTRAOPERATIVE TRANSESOPHAGEAL ECHOCARDIOGRAM;  Surgeon: [name], MD;  Location: [facility] OR;  Service: Open Heart Surgery;  Laterality: N/A;   Social History:  reports that he quit smoking about 12 years ago. His smoking use included Cigarettes. He has a 2 pack-year smoking history. He has never used smokeless tobacco. He reports that he does not drink alcohol or use illicit drugs. Where does patient live home. Can patient participate in ADLs? Yes.  No Known Allergies  Family History:  Family History  Problem Relation Age of Onset  . Heart disease Sister       Prior to Admission medications   Medication Sig Start Date End Date Taking? Authorizing Provider  albuterol (PROVENTIL HFA;VENTOLIN HFA) 108 (90 BASE) MCG/ACT inhaler Inhale 2 puffs into the lungs every 6 (six) hours as needed. For shortness of breath   Yes Historical Provider, MD  aspirin EC 81 MG tablet Take 81 mg by mouth daily.   Yes Historical Provider, MD  doxazosin (CARDURA) 4 MG tablet Take 4 mg by mouth daily.   Yes Historical Provider, MD  metoprolol tartrate (LOPRESSOR) 25 MG tablet Take 1 tablet (25 mg total) by mouth 2 (two) times daily. 05/08/12  Yes Erin Barrett, PA-C  nitroGLYCERIN (NITROSTAT) 0.4 MG SL tablet Place 0.4 mg under the tongue every 5 (five)  minutes x 3 doses as needed. For chest pain   Yes Historical Provider, MD  omeprazole (PRILOSEC) 20 MG capsule Take 20 mg by mouth 2 (two) times daily.    Yes Historical Provider, MD  oxyCODONE (ROXICODONE) 5 MG immediate release tablet Take 1 tablet (5 mg total) by mouth every 6 (six) hours as needed for severe pain. 04/17/13  Yes Gwyneth Sprout, MD  pravastatin (PRAVACHOL) 20 MG tablet Take 20 mg by mouth every evening.   Yes  Historical Provider, MD    Physical Exam: Filed Vitals:   04/19/13 0400 04/19/13 0430 04/19/13 0500 04/19/13 0530  BP: 110/82 113/77 121/64 116/77  Pulse: 70 72 73 66  Temp:      TempSrc:      Resp: 17 16 18 16   Height:      Weight:      SpO2: 95% 93% 96% 95%     General:  Well-developed and nourished.  Eyes: Anicteric no pallor.  ENT: No discharge from the ears eyes nose mouth.  Neck: No mass felt.  Cardiovascular: S1-S2 heard.  Respiratory: No rhonchi or crepitations.  Abdomen: Soft nontender bowel sounds present.  Skin: No rash.  Musculoskeletal: Mildly swollen left ankle.  Psychiatric: Appears normal.  Neurologic: Alert awake oriented to time place and person. Moves all extremities.  Labs on Admission:  Basic Metabolic Panel:  Recent Labs Lab 04/18/13 1931  NA 135*  K 3.9  CL 99  CO2 22  GLUCOSE 107*  BUN 14  CREATININE 1.46*  CALCIUM 8.5   Liver Function Tests:  Recent Labs Lab 04/18/13 1931  AST 62*  ALT 75*  ALKPHOS 173*  BILITOT 1.3*  PROT 7.2  ALBUMIN 3.0*   No results found for this basename: LIPASE, AMYLASE,  in the last 168 hours No results found for this basename: AMMONIA,  in the last 168 hours CBC:  Recent Labs Lab 04/18/13 1931  WBC 9.6  NEUTROABS 8.0*  HGB 12.6*  HCT 35.8*  MCV 84.4  PLT PLATELET CLUMPS NOTED ON SMEAR, UNABLE TO ESTIMATE   Cardiac Enzymes: No results found for this basename: CKTOTAL, CKMB, CKMBINDEX, TROPONINI,  in the last 168 hours  BNP (last 3 results) No results found for this basename: PROBNP,  in the last 8760 hours CBG: No results found for this basename: GLUCAP,  in the last 168 hours  Radiological Exams on Admission: Ct Head Wo Contrast  04/19/2013   CLINICAL DATA:  Two seizures.  EXAM: CT HEAD WITHOUT CONTRAST  TECHNIQUE: Contiguous axial images were obtained from the base of the skull through the vertex without intravenous contrast.  COMPARISON:  None.  FINDINGS: There is no evidence  of acute infarction, mass lesion, or intra- or extra-axial hemorrhage on CT.  Prominence of the sulci suggest mild cortical volume loss. Scattered periventricular and subcortical white matter change likely reflects small vessel ischemic microangiopathy. Mild cerebellar atrophy is noted.  The brainstem and fourth ventricle are within normal limits. The basal ganglia are unremarkable in appearance. The cerebral hemispheres demonstrate grossly normal gray-white differentiation. No mass effect or midline shift is seen.  There is no evidence of fracture; visualized osseous structures are unremarkable in appearance. The orbits are within normal limits. Mucoperiosteal thickening is noted within the maxillary sinuses bilaterally. The remaining paranasal sinuses and mastoid air cells are well-aerated. No significant soft tissue abnormalities are seen.  IMPRESSION: 1. No acute intracranial pathology seen on CT. 2. Mild cortical volume loss and scattered small vessel ischemic microangiopathy.  3. Mucoperiosteal thickening within the maxillary sinuses bilaterally.   Electronically Signed   By: Roanna RaiderJeffery  Chang M.D.   On: 04/19/2013 03:41   Dg Chest Port 1 View  04/19/2013   CLINICAL DATA:  Fever; seizure.  EXAM: PORTABLE CHEST - 1 VIEW  COMPARISON:  Chest radiograph performed 03/30/2013  FINDINGS: The lungs are well-aerated. Pulmonary vascularity is at the upper limits of normal. Mild left basilar opacity likely reflects atelectasis. There is no evidence of pleural effusion or pneumothorax.  The cardiomediastinal silhouette is mildly enlarged. The patient is status post median sternotomy. No acute osseous abnormalities are seen.  IMPRESSION: Mild left basilar airspace opacity likely reflects atelectasis. Lungs otherwise clear. Mild cardiomegaly.   Electronically Signed   By: Roanna RaiderJeffery  Chang M.D.   On: 04/19/2013 03:26     Assessment/Plan Principal Problem:   Seizures Active Problems:   HYPERLIPIDEMIA   Essential  hypertension, benign   S/P AVR (aortic valve replacement)   Gout   1. Seizures - new-onset. I have discussed with on-call neurologist Dr. Leroy Kennedyamilo. Since patient has chronic kidney disease MRI brain with and without contrast. EEG has been ordered. Patient will be placed on seizure precautions and when necessary Ativan for any seizure-like activities for now. For now hold oxycodone. 2. Mild fever - since patient has mild productive cough we will check influenza PCR. 3. Elevated LFTs - repeat LFTs are been ordered. Since patient also has mild fever acute hepatitis panel has been ordered. Since patient has had abdominal discomfort recently I have ordered a sonogram of the abdomen given that patient also has elevated LFTs. 4. Hypertension - continue home medications. 5. Hyperlipidemia - patient on statins. Since patient's LFTs are elevated we will hold statins for now. 6. History of aortic valve replacement with tissue valve. 7. Chronic kidney disease - closely follow intake output and metabolic panel. Patient's urine shows significant proteinuria which will need further workup as outpatient. 8. Mild hematuria - will need followup as outpatient. 9. Anemia - repeat CBC has been ordered and follow hemoglobin levels. 10. Left ankle swelling - check uric acid levels.  Initial CBC did not show patient's platelet counts so repeat CBC has been ordered.  I have reviewed patient's chart and old labs. Discussed with on-call Neurologist.    Code Status: Full code.  Family Communication: Patient's son.  Disposition Plan: Admit to inpatient.    Marijane Trower N. [facility] Pager [phone].  If 7PM-7AM, please contact night-coverage [email or web address] Password [email or web address] 04/19/2013, 6:42 AM

## 2013-04-19 NOTE — Progress Notes (Signed)
Patient seen by Dr. Cyril Mourningamillo this am.   Back to baseline per son. No previous episodes of staring, or known seizures in the past. Only recent med change was oxycodone. No history of memory problems per son. No history of etoh.   First unprovoked seizure(two, but within 24 hours of each other). EEG was negative. If MRI shows a clear cortical lesion, would consider starting AED, otherwise, would favor not starting AED unless seizure recurrance.   Ritta SlotMcNeill Kirkpatrick, MD [facility] [phone]  If 7pm- 7am, please page neurology on call at [phone].

## 2013-04-20 ENCOUNTER — Inpatient Hospital Stay (HOSPITAL_COMMUNITY): Payer: Medicaid Other

## 2013-04-20 LAB — CBC
HEMATOCRIT: 34.2 % — AB (ref 39.0–52.0)
Hemoglobin: 11.9 g/dL — ABNORMAL LOW (ref 13.0–17.0)
MCH: 29.5 pg (ref 26.0–34.0)
MCHC: 34.8 g/dL (ref 30.0–36.0)
MCV: 84.9 fL (ref 78.0–100.0)
PLATELETS: 246 10*3/uL (ref 150–400)
RBC: 4.03 MIL/uL — ABNORMAL LOW (ref 4.22–5.81)
RDW: 12.7 % (ref 11.5–15.5)
WBC: 5.5 10*3/uL (ref 4.0–10.5)

## 2013-04-20 LAB — BASIC METABOLIC PANEL
BUN: 13 mg/dL (ref 6–23)
CALCIUM: 8.8 mg/dL (ref 8.4–10.5)
CO2: 24 mEq/L (ref 19–32)
CREATININE: 1.4 mg/dL — AB (ref 0.50–1.35)
Chloride: 102 mEq/L (ref 96–112)
GFR, EST AFRICAN AMERICAN: 57 mL/min — AB (ref >90)
GFR, EST NON AFRICAN AMERICAN: 49 mL/min — AB (ref >90)
Glucose, Bld: 111 mg/dL — ABNORMAL HIGH (ref 70–99)
Potassium: 4 mEq/L (ref 3.7–5.3)
Sodium: 139 mEq/L (ref 137–147)

## 2013-04-20 NOTE — Progress Notes (Signed)
Pt discharged to home per MD order. Pt and son received and reviewed all discharge instructions and medication information including follow-up appointments and prescription information. Pt son provided Nepali interpretation per pt request. Pt verbalized understanding. Pt IV and telemetry box removed prior to discharge. Pt alert and oriented at discharge with no complaints of pain. Pt escorted to private vehicle via wheelchair by nurse tech.  Joylene GrapesMonge, Ladarrius Bogdanski C

## 2013-04-20 NOTE — Discharge Instructions (Signed)
°  No driving until cleared by your PCP    Seizure, Adult A seizure is abnormal electrical activity in the brain. Seizures usually last from 30 seconds to 2 minutes. There are various types of seizures. Before a seizure, you may have a warning sensation (aura) that a seizure is about to occur. An aura may include the following symptoms:   Fear or anxiety.  Nausea.  Feeling like the room is spinning (vertigo).  Vision changes, such as seeing flashing lights or spots. Common symptoms during a seizure include:  A change in attention or behavior (altered mental status).  Convulsions with rhythmic jerking movements.  Drooling.  Rapid eye movements.  Grunting.  Loss of bladder and bowel control.  Bitter taste in the mouth.  Tongue biting. After a seizure, you may feel confused and sleepy. You may also have an injury resulting from convulsions during the seizure. HOME CARE INSTRUCTIONS   If you are given medicines, take them exactly as prescribed by your health care provider.  Keep all follow-up appointments as directed by your health care provider.  Do not swim or drive or engage in risky activity during which a seizure could cause further injury to you or others until your health care provider says it is OK.  Get adequate rest.  Teach friends and family what to do if you have a seizure. They should:  Lay you on the ground to prevent a fall.  Put a cushion under your head.  Loosen any tight clothing around your neck.  Turn you on your side. If vomiting occurs, this helps keep your airway clear.  Stay with you until you recover.  Know whether or not you need emergency care. SEEK IMMEDIATE MEDICAL CARE IF:  The seizure lasts longer than 5 minutes.  The seizure is severe or you do not wake up immediately after the seizure.  You have an altered mental status after the seizure.  You are having more frequent or worsening seizures. Someone should drive you to the  emergency department or call local emergency services (911 in [place] MAKE SURE YOU:  Understand these instructions.  Will watch your condition.  Will get help right away if you are not doing well or get worse. Document Released: 03/13/2000 Document Revised: 01/04/2013 Document Reviewed: 10/26/2012 [facility] Patient Information 2014 [employer], [place].

## 2013-04-20 NOTE — Discharge Summary (Signed)
Physician Discharge Summary  Ryan Burke JXB:[id number] DOB: Mar 08, 1941 DOA: 04/19/2013  PCP: No PCP Per Patient  Admit date: 04/19/2013 Discharge date: 04/20/2013  Time spent: 35 minutes  Recommendations for Outpatient Follow-up:  1. Follow up with PCP in 1-2 weeks 2. No driving until cleared by PCP  Discharge Diagnoses:  Principal Problem:   Seizures Active Problems:   HYPERLIPIDEMIA   Essential hypertension, benign   S/P AVR (aortic valve replacement)   Gout   Discharge Condition: Stable  Diet recommendation: Regular  Filed Weights   04/18/13 1919 04/20/13 0547  Weight: 70.308 kg (155 lb) 70.113 kg (154 lb 9.1 oz)    History of present illness:  Ryan Burke is a 73 y.o. male history of aortic valve replacement, tissue valve, hypertension hyperlipidemia and chronic kidney disease was brought to the ER patient is to have a second seizure by patient's family. As per patient's son patient had a seizure morning witnessed by patient's wife which lasted for around one minute which was generalized tonic-clonic and patient had was consciousness after seizure. Last evening around 5 PM when patient's son was at home patient had another seizure lasted for around 3-minutes which was also generalized tonic-clonic patient was consciousness after the seizure. Patient was brought to the ER patient was found to be nonfocal CT head did not show anything acute and on call neurologist has been consulted and patient admitted for further workup. Patient otherwise denies any chest pain shortness of breath nausea vomiting abdominal pain diarrhea. Patient has had some left foot pain since last month and was placed on oxycodone 2 days ago by the ER physician for pain. Patient had come to the ER 3 weeks ago for similar complaints and was placed on Keflex and colchicine which did not help him much.  Hospital Course:  1. Seizures - new-onset. Stable thus far. EEG unremarkable. MRI unremarkable 2. Mild fever  - flu neg. Afebrile overnight 3. Elevated LFTs - repeat LFTs trending down. Liver US demonstrates fatty liver. 4. Hypertension - continue home medications. 5. Hyperlipidemia - patient on statins. Would resume statin on d/c 6. History of aortic valve replacement with tissue valve. 7. Chronic kidney disease - closely follow intake output and metabolic panel. Patient's urine shows significant proteinuria which will need further workup as outpatient. 8. Mild hematuria - will need followup as outpatient. 9. Anemia - repeat CBC has been ordered and follow hemoglobin levels. 10. Left ankle swelling - uric acid levels mildly elevated - follow up with PCP  Consultations:  Neurology  Discharge Exam: Filed Vitals:   04/19/13 2059 04/20/13 0547 04/20/13 1052 04/20/13 1423  BP: 124/82 133/86 108/71 100/63  Pulse: 65 54 65 56  Temp: 98.3 F (36.8 C) 97.9 F (36.6 C)  98.1 F (36.7 C)  TempSrc: Oral Oral  Oral  Resp: 18 18  18   Height:      Weight:  70.113 kg (154 lb 9.1 oz)    SpO2: 95% 96%  100%    General: awake, in nad Cardiovascular: regular, s1, s2 Respiratory: normal resp effort, no wheezing  Discharge Instructions     Medication List         albuterol 108 (90 BASE) MCG/ACT inhaler  Commonly known as:  PROVENTIL HFA;VENTOLIN HFA  Inhale 2 puffs into the lungs every 6 (six) hours as needed. For shortness of breath     aspirin EC 81 MG tablet  Take 81 mg by mouth daily.     doxazosin 4  MG tablet  Commonly known as:  CARDURA  Take 4 mg by mouth daily.     metoprolol tartrate 25 MG tablet  Commonly known as:  LOPRESSOR  Take 1 tablet (25 mg total) by mouth 2 (two) times daily.     nitroGLYCERIN 0.4 MG SL tablet  Commonly known as:  NITROSTAT  Place 0.4 mg under the tongue every 5 (five) minutes x 3 doses as needed. For chest pain     omeprazole 20 MG capsule  Commonly known as:  PRILOSEC  Take 20 mg by mouth 2 (two) times daily.     oxyCODONE 5 MG immediate release  tablet  Commonly known as:  ROXICODONE  Take 1 tablet (5 mg total) by mouth every 6 (six) hours as needed for severe pain.     pravastatin 20 MG tablet  Commonly known as:  PRAVACHOL  Take 20 mg by mouth every evening.       No Known Allergies    The results of significant diagnostics from this hospitalization (including imaging, microbiology, ancillary and laboratory) are listed below for reference.    Significant Diagnostic Studies: Dg Chest 2 View (if Patient Has Fever And/or Copd)  03/30/2013   CLINICAL DATA:  Fever, cough, congestion and headache.  EXAM: CHEST  2 VIEW  COMPARISON:  06/07/2012  FINDINGS: Sternotomy wires are present. Patient is slightly rotated to the right. Lungs are hypoinflated with subtle stable interstitial prominence. No focal consolidation or effusion. Mild stable cardiomegaly. Remainder of the exam is unchanged.  IMPRESSION: No active cardiopulmonary disease.  Mild stable chronic interstitial prominence.  Stable cardiomegaly.   Electronically Signed   By: Elberta Fortis M.D.   On: 03/30/2013 11:01   Dg Ankle Complete Left  03/30/2013   CLINICAL DATA:  Left ankle pain  EXAM: LEFT ANKLE COMPLETE - 3+ VIEW  COMPARISON:  None.  FINDINGS: There is no acute fracture or dislocation. There is an unfused left distal fibular physis. There is severe soft tissue swelling over the lateral malleolus. The ankle mortise is intact. There is a moderate joint effusion.  IMPRESSION: 1. No acute osseous injury of the left ankle. 2. Soft tissue swelling over the lateral malleolus. 3. Moderate ankle joint effusion. 4. Unfused left distal fibular physis.   Electronically Signed   By: Elige Ko   On: 03/30/2013 11:04   Ct Head Wo Contrast  04/19/2013   CLINICAL DATA:  Two seizures.  EXAM: CT HEAD WITHOUT CONTRAST  TECHNIQUE: Contiguous axial images were obtained from the base of the skull through the vertex without intravenous contrast.  COMPARISON:  None.  FINDINGS: There is no evidence  of acute infarction, mass lesion, or intra- or extra-axial hemorrhage on CT.  Prominence of the sulci suggest mild cortical volume loss. Scattered periventricular and subcortical white matter change likely reflects small vessel ischemic microangiopathy. Mild cerebellar atrophy is noted.  The brainstem and fourth ventricle are within normal limits. The basal ganglia are unremarkable in appearance. The cerebral hemispheres demonstrate grossly normal gray-white differentiation. No mass effect or midline shift is seen.  There is no evidence of fracture; visualized osseous structures are unremarkable in appearance. The orbits are within normal limits. Mucoperiosteal thickening is noted within the maxillary sinuses bilaterally. The remaining paranasal sinuses and mastoid air cells are well-aerated. No significant soft tissue abnormalities are seen.  IMPRESSION: 1. No acute intracranial pathology seen on CT. 2. Mild cortical volume loss and scattered small vessel ischemic microangiopathy. 3. Mucoperiosteal thickening within the  maxillary sinuses bilaterally.   Electronically Signed   By: Roanna RaiderJeffery  Chang M.D.   On: 04/19/2013 03:41   [place] Abdomen Complete  04/19/2013   CLINICAL DATA:  Elevated liver function tests.  EXAM: ULTRASOUND ABDOMEN COMPLETE  COMPARISON:  09/24/2009  FINDINGS: Gallbladder:  No gallstones or wall thickening visualized. No sonographic Murphy sign noted.  Common bile duct:  Diameter: 6 mm  Liver:  Diffusely increased echogenicity of the hepatic parenchyma, consistent with hepatic steatosis. No focal mass lesion identified.  IVC:  Not well visualized due to severe hepatic steatosis.  Pancreas:  Not well visualized due to overlying bowel gas and habitus.  Spleen:  Size and appearance within normal limits.  Right Kidney:  Length: 10.7 cm. Echogenicity within normal limits. No mass or hydronephrosis visualized.  Left Kidney:  Length: 10.6 cm. Echogenicity within normal limits. No mass or hydronephrosis  visualized.  Abdominal aorta:  No aneurysm visualized.  Other findings:  None.  IMPRESSION: Severe hepatic steatosis, increased compared to previous exam in 2011.  No evidence of gallstones, biliary dilatation, or other sonographic abnormality.   Electronically Signed   By: Myles RosenthalJohn  Stahl M.D.   On: 04/19/2013 13:49   Dg Chest Port 1 View  04/19/2013   CLINICAL DATA:  Fever; seizure.  EXAM: PORTABLE CHEST - 1 VIEW  COMPARISON:  Chest radiograph performed 03/30/2013  FINDINGS: The lungs are well-aerated. Pulmonary vascularity is at the upper limits of normal. Mild left basilar opacity likely reflects atelectasis. There is no evidence of pleural effusion or pneumothorax.  The cardiomediastinal silhouette is mildly enlarged. The patient is status post median sternotomy. No acute osseous abnormalities are seen.  IMPRESSION: Mild left basilar airspace opacity likely reflects atelectasis. Lungs otherwise clear. Mild cardiomegaly.   Electronically Signed   By: Roanna RaiderJeffery  Chang M.D.   On: 04/19/2013 03:26    Microbiology: No results found for this or any previous visit (from the past 240 hour(s)).   Labs: Basic Metabolic Panel:  Recent Labs Lab 04/18/13 1931 04/19/13 0810 04/20/13 0640  NA 135* 138 139  K 3.9 3.7 4.0  CL 99 99 102  CO2 22 24 24   GLUCOSE 107* 92 111*  BUN 14 14 13   CREATININE 1.46* 1.47* 1.40*  CALCIUM 8.5 8.6 8.8   Liver Function Tests:  Recent Labs Lab 04/18/13 1931 04/19/13 0810  AST 62* 41*  ALT 75* 59*  ALKPHOS 173* 146*  BILITOT 1.3* 1.8*  PROT 7.2 6.5  ALBUMIN 3.0* 2.8*   No results found for this basename: LIPASE, AMYLASE,  in the last 168 hours No results found for this basename: AMMONIA,  in the last 168 hours CBC:  Recent Labs Lab 04/18/13 1931 04/19/13 0810 04/20/13 0640  WBC 9.6 6.1 5.5  NEUTROABS 8.0* 4.2  --   HGB 12.6* 11.2* 11.9*  HCT 35.8* 32.5* 34.2*  MCV 84.4 85.5 84.9  PLT PLATELET CLUMPS NOTED ON SMEAR, UNABLE TO ESTIMATE 211 246    Cardiac Enzymes: No results found for this basename: CKTOTAL, CKMB, CKMBINDEX, TROPONINI,  in the last 168 hours BNP: BNP (last 3 results) No results found for this basename: PROBNP,  in the last 8760 hours CBG: No results found for this basename: GLUCAP,  in the last 168 hours   Signed:  CHIU, STEPHEN K  [facility] 04/20/2013, 3:53 PM

## 2013-04-20 NOTE — Progress Notes (Signed)
NEURO HOSPITALIST PROGRESS NOTE   SUBJECTIVE:                                                                                                                        Patient continues to be back to his baseline.   OBJECTIVE:                                                                                                                           Vital signs in last 24 hours: Temp:  [97.7 F (36.5 C)-98.3 F (36.8 C)] 97.9 F (36.6 C) (01/22 0547) Pulse Rate:  [54-67] 54 (01/22 0547) Resp:  [18] 18 (01/22 0547) BP: (118-133)/(72-86) 133/86 mmHg (01/22 0547) SpO2:  [95 %-97 %] 96 % (01/22 0547) Weight:  [70.113 kg (154 lb 9.1 oz)] 70.113 kg (154 lb 9.1 oz) (01/22 0547)  Intake/Output from previous day: 01/21 0701 - 01/22 0700 In: 120 [P.O.:120] Out: -  Intake/Output this shift:   Nutritional status: Cardiac  Past Medical History  Diagnosis Date  . Hypertension   . Hyperlipidemia   . Aortic regurgitation   . Mitral regurgitation   . Chest pain at rest   . SOB (shortness of breath)   . GERD (gastroesophageal reflux disease)   . Heart murmur   . Asthma   . Seizures 03/2013  . Chronic kidney disease      Neurologic Exam:   Mental Status: Alert, oriented, thought content appropriate.  Speech fluent without evidence of aphasia.  Able to follow 3 step commands without difficulty. Cranial Nerves: II: Visual fields grossly normal, pupils equal, round, reactive to light and accommodation III,IV, VI: ptosis not present, extra-ocular motions intact bilaterally V,VII: smile symmetric with at rest left NL fold decrease, facial light touch sensation normal bilaterally VIII: hearing normal bilaterally IX,X: gag reflex present XI: bilateral shoulder shrug XII: midline tongue extension without atrophy or fasciculations  Motor: Right : Upper extremity   5/5    Left:     Upper extremity   5/5  Lower extremity   5/5     Lower extremity    5/5 Tone and bulk:normal tone throughout; no atrophy noted Sensory: Pinprick and light touch intact throughout, bilaterally Deep Tendon Reflexes:  Right:  Upper Extremity   Left: Upper extremity   biceps (C-5 to C-6) 2/4   biceps (C-5 to C-6) 2/4 tricep (C7) 2/4    triceps (C7) 2/4 Brachioradialis (C6) 2/4  Brachioradialis (C6) 2/4  Lower Extremity Lower Extremity  quadriceps (L-2 to L-4) 2/4   quadriceps (L-2 to L-4) 2/4 Achilles (S1) 2/4   Achilles (S1) 2/4  Plantars: Right: downgoing   Left: downgoing   Lab Results: Basic Metabolic Panel:  Recent Labs Lab 04/18/13 1931 04/19/13 0810 04/20/13 0640  NA 135* 138 139  K 3.9 3.7 4.0  CL 99 99 102  CO2 22 24 24   GLUCOSE 107* 92 111*  BUN 14 14 13   CREATININE 1.46* 1.47* 1.40*  CALCIUM 8.5 8.6 8.8    Liver Function Tests:  Recent Labs Lab 04/18/13 1931 04/19/13 0810  AST 62* 41*  ALT 75* 59*  ALKPHOS 173* 146*  BILITOT 1.3* 1.8*  PROT 7.2 6.5  ALBUMIN 3.0* 2.8*   No results found for this basename: LIPASE, AMYLASE,  in the last 168 hours No results found for this basename: AMMONIA,  in the last 168 hours  CBC:  Recent Labs Lab 04/18/13 1931 04/19/13 0810 04/20/13 0640  WBC 9.6 6.1 5.5  NEUTROABS 8.0* 4.2  --   HGB 12.6* 11.2* 11.9*  HCT 35.8* 32.5* 34.2*  MCV 84.4 85.5 84.9  PLT PLATELET CLUMPS NOTED ON SMEAR, UNABLE TO ESTIMATE 211 246    Cardiac Enzymes: No results found for this basename: CKTOTAL, CKMB, CKMBINDEX, TROPONINI,  in the last 168 hours  Lipid Panel: No results found for this basename: CHOL, TRIG, HDL, CHOLHDL, VLDL, LDLCALC,  in the last 168 hours  CBG: No results found for this basename: GLUCAP,  in the last 168 hours  Microbiology: Results for orders placed during the hospital encounter of 05/04/12  CLOSTRIDIUM DIFFICILE BY PCR     Status: None   Collection Time    05/06/12 10:55 PM      Result Value Range Status   C difficile by pcr NEGATIVE  NEGATIVE Final     Coagulation Studies: No results found for this basename: LABPROT, INR,  in the last 72 hours  Imaging: Ct Head Wo Contrast  04/19/2013   CLINICAL DATA:  Two seizures.  EXAM: CT HEAD WITHOUT CONTRAST  TECHNIQUE: Contiguous axial images were obtained from the base of the skull through the vertex without intravenous contrast.  COMPARISON:  None.  FINDINGS: There is no evidence of acute infarction, mass lesion, or intra- or extra-axial hemorrhage on CT.  Prominence of the sulci suggest mild cortical volume loss. Scattered periventricular and subcortical white matter change likely reflects small vessel ischemic microangiopathy. Mild cerebellar atrophy is noted.  The brainstem and fourth ventricle are within normal limits. The basal ganglia are unremarkable in appearance. The cerebral hemispheres demonstrate grossly normal gray-white differentiation. No mass effect or midline shift is seen.  There is no evidence of fracture; visualized osseous structures are unremarkable in appearance. The orbits are within normal limits. Mucoperiosteal thickening is noted within the maxillary sinuses bilaterally. The remaining paranasal sinuses and mastoid air cells are well-aerated. No significant soft tissue abnormalities are seen.  IMPRESSION: 1. No acute intracranial pathology seen on CT. 2. Mild cortical volume loss and scattered small vessel ischemic microangiopathy. 3. Mucoperiosteal thickening within the maxillary sinuses bilaterally.   Electronically Signed   By: Roanna Raider M.D.   On: 04/19/2013 03:41   US Abdomen Complete  04/19/2013   CLINICAL DATA:  Elevated liver function tests.  EXAM: ULTRASOUND ABDOMEN COMPLETE  COMPARISON:  09/24/2009  FINDINGS: Gallbladder:  No gallstones or wall thickening visualized. No sonographic Murphy sign noted.  Common bile duct:  Diameter: 6 mm  Liver:  Diffusely increased echogenicity of the hepatic parenchyma, consistent with hepatic steatosis. No focal mass lesion  identified.  IVC:  Not well visualized due to severe hepatic steatosis.  Pancreas:  Not well visualized due to overlying bowel gas and habitus.  Spleen:  Size and appearance within normal limits.  Right Kidney:  Length: 10.7 cm. Echogenicity within normal limits. No mass or hydronephrosis visualized.  Left Kidney:  Length: 10.6 cm. Echogenicity within normal limits. No mass or hydronephrosis visualized.  Abdominal aorta:  No aneurysm visualized.  Other findings:  None.  IMPRESSION: Severe hepatic steatosis, increased compared to previous exam in 2011.  No evidence of gallstones, biliary dilatation, or other sonographic abnormality.   Electronically Signed   By: Myles RosenthalJohn  Stahl M.D.   On: 04/19/2013 13:49   Dg Chest Port 1 View  04/19/2013   CLINICAL DATA:  Fever; seizure.  EXAM: PORTABLE CHEST - 1 VIEW  COMPARISON:  Chest radiograph performed 03/30/2013  FINDINGS: The lungs are well-aerated. Pulmonary vascularity is at the upper limits of normal. Mild left basilar opacity likely reflects atelectasis. There is no evidence of pleural effusion or pneumothorax.  The cardiomediastinal silhouette is mildly enlarged. The patient is status post median sternotomy. No acute osseous abnormalities are seen.  IMPRESSION: Mild left basilar airspace opacity likely reflects atelectasis. Lungs otherwise clear. Mild cardiomegaly.   Electronically Signed   By: Roanna RaiderJeffery  Chang M.D.   On: 04/19/2013 03:26       MEDICATIONS                                                                                                                        Scheduled: . aspirin EC  81 mg Oral Daily  . doxazosin  4 mg Oral Daily  . enoxaparin (LOVENOX) injection  40 mg Subcutaneous Q24H  . metoprolol tartrate  25 mg Oral BID  . pantoprazole  40 mg Oral Daily  . simvastatin  10 mg Oral q1800  . sodium chloride  3 mL Intravenous Q12H    ASSESSMENT/PLAN:                                                                                                             Patient with first unprovoked seizure.  EEG negative for epileptiform activity. Awaiting MRI results.  Unless MRI  shows cortical lesion or patient has further seizure activity would favor not starting AED.  Will follow MRI findings.  Feel free to call with questions.   Assessment and plan discussed with with attending physician and they are in agreement.    Felicie Morn PA-C [facility] [phone]  04/20/2013, 10:22 AM

## 2014-03-08 ENCOUNTER — Encounter (HOSPITAL_COMMUNITY): Payer: Self-pay | Admitting: Cardiology

## 2015-05-08 IMAGING — CR DG CHEST 2V
2 series · 2 of 2 positions shown · non-contrast
Comparison: 06/07/2012

CLINICAL DATA: Fever, cough, congestion and headache.

EXAM:
CHEST  2 VIEW

[w chest lat]
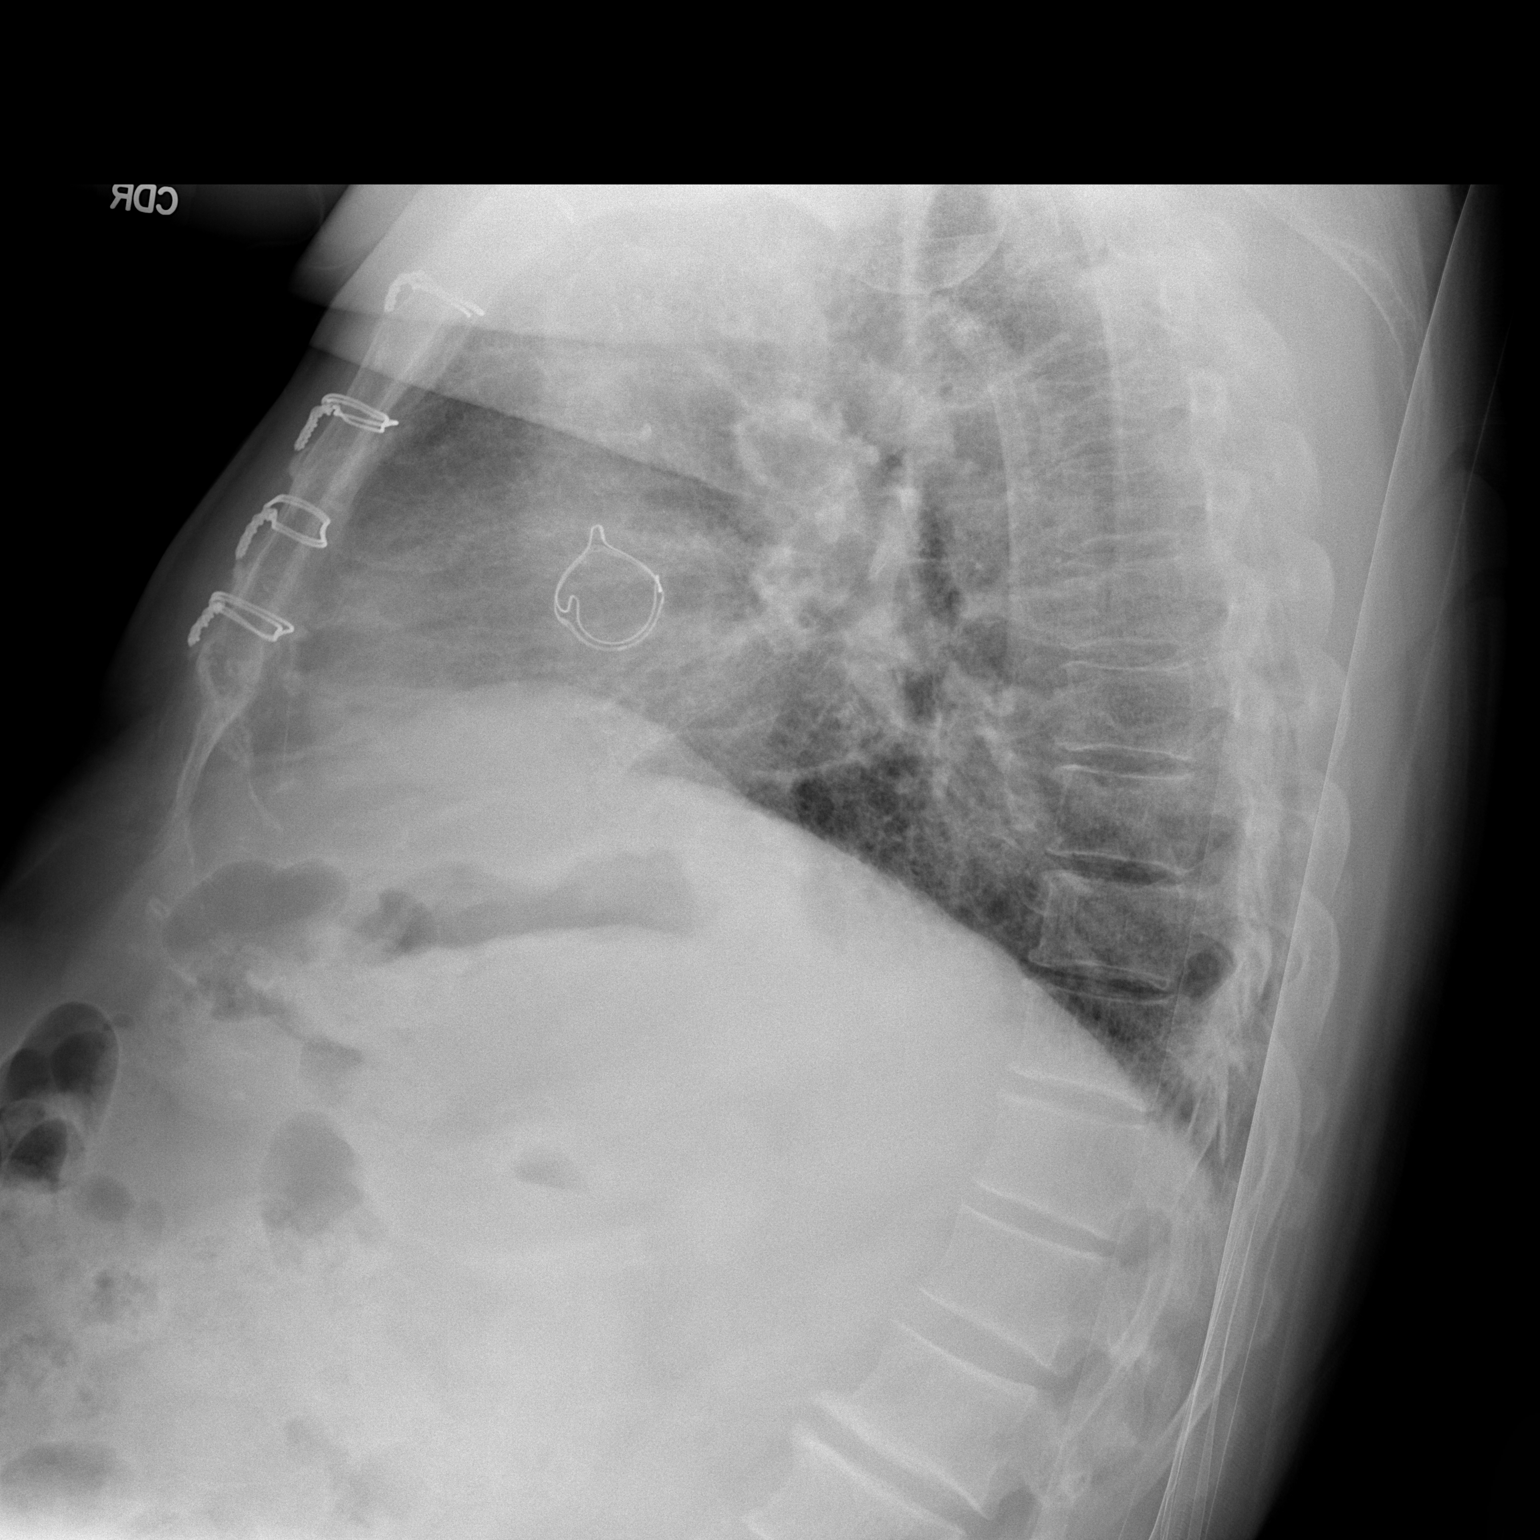

[x chest ap]
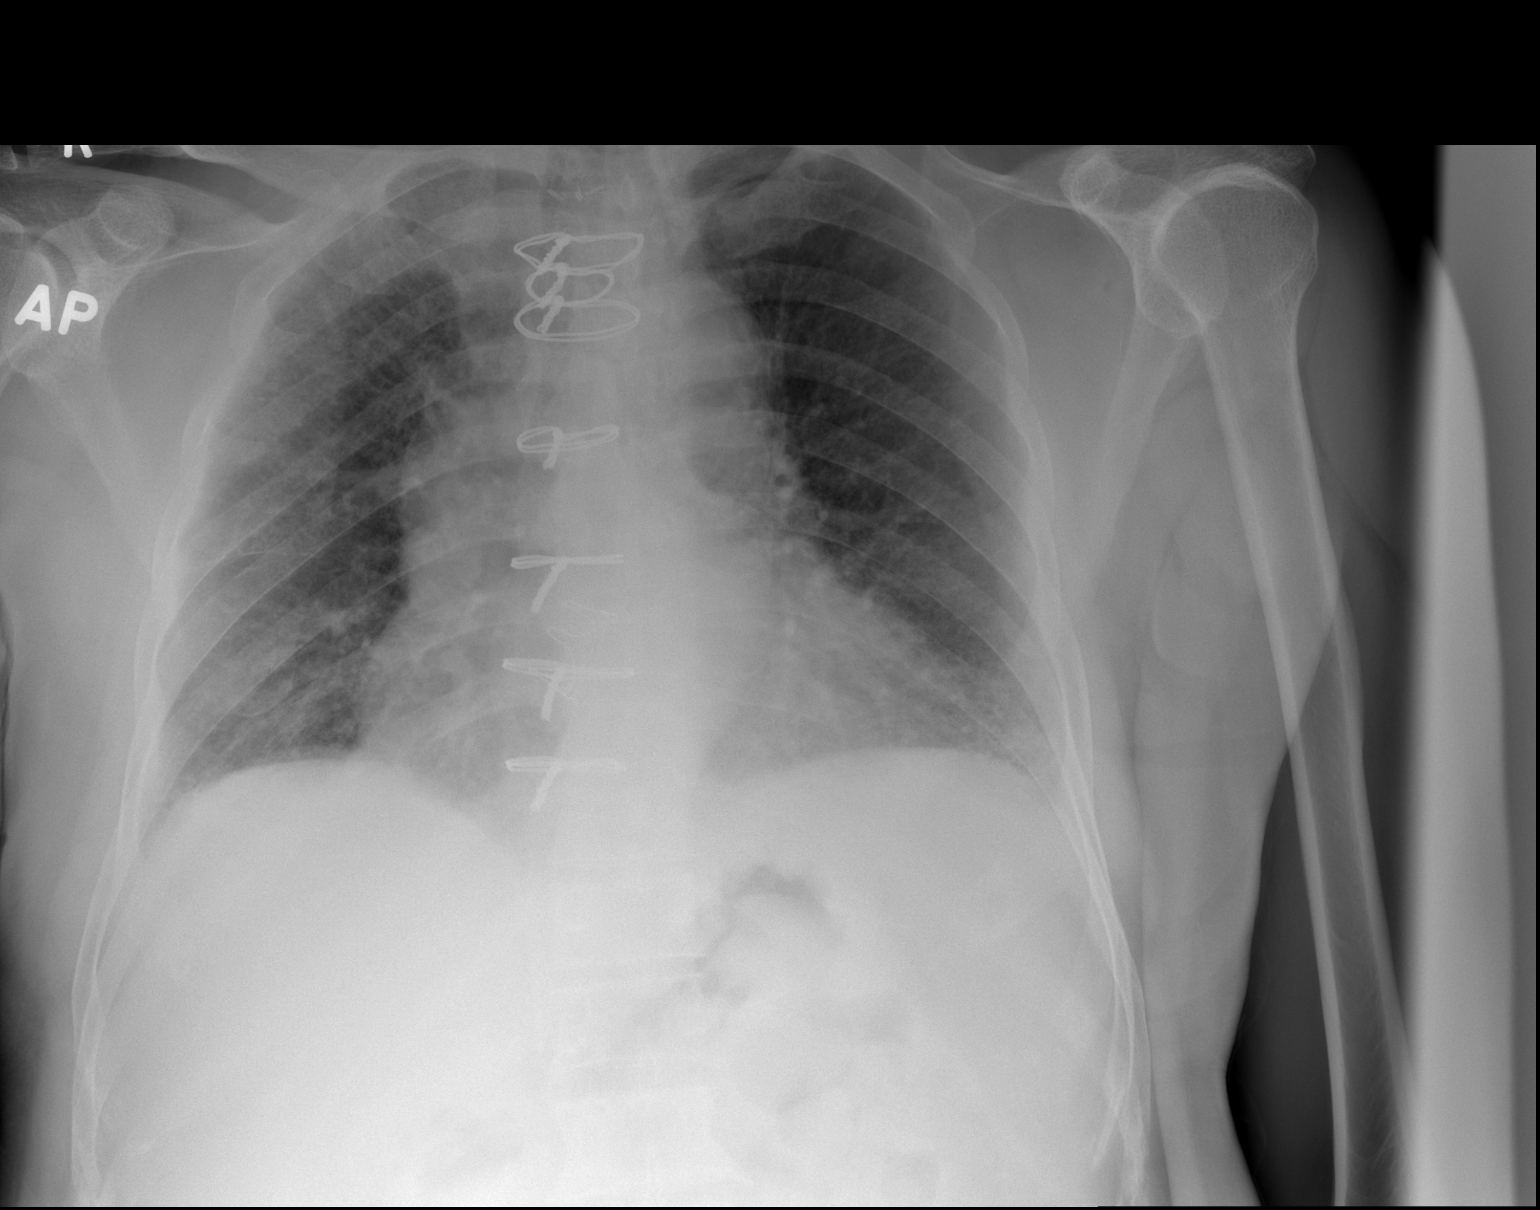

[2 of 2 positions shown; findings below may reference images not displayed]

FINDINGS: Sternotomy wires are present. Patient is slightly rotated to the
right. Lungs are hypoinflated with subtle stable interstitial
prominence. No focal consolidation or effusion. Mild stable
cardiomegaly. Remainder of the exam is unchanged.
IMPRESSION: No active cardiopulmonary disease.

Mild stable chronic interstitial prominence.  Stable cardiomegaly.

## 2016-08-27 ENCOUNTER — Observation Stay (HOSPITAL_COMMUNITY)
Admission: EM | Admit: 2016-08-27 | Discharge: 2016-08-28 | Disposition: A | Discharge status: Home / Self Care | Payer: Medicare Other | Attending: Family Medicine | Admitting: Family Medicine

## 2016-08-27 ENCOUNTER — Encounter (HOSPITAL_COMMUNITY): Payer: Self-pay | Admitting: Emergency Medicine

## 2016-08-27 ENCOUNTER — Emergency Department (HOSPITAL_COMMUNITY): Payer: Medicare Other

## 2016-08-27 DIAGNOSIS — Z952 Presence of prosthetic heart valve: Secondary | ICD-10-CM

## 2016-08-27 DIAGNOSIS — E1169 Type 2 diabetes mellitus with other specified complication: Secondary | ICD-10-CM | POA: Diagnosis present

## 2016-08-27 DIAGNOSIS — J209 Acute bronchitis, unspecified: Secondary | ICD-10-CM

## 2016-08-27 DIAGNOSIS — J069 Acute upper respiratory infection, unspecified: Secondary | ICD-10-CM | POA: Insufficient documentation

## 2016-08-27 DIAGNOSIS — J45909 Unspecified asthma, uncomplicated: Secondary | ICD-10-CM | POA: Insufficient documentation

## 2016-08-27 DIAGNOSIS — R509 Fever, unspecified: Principal | ICD-10-CM | POA: Insufficient documentation

## 2016-08-27 DIAGNOSIS — Z7982 Long term (current) use of aspirin: Secondary | ICD-10-CM | POA: Insufficient documentation

## 2016-08-27 DIAGNOSIS — E785 Hyperlipidemia, unspecified: Secondary | ICD-10-CM | POA: Diagnosis present

## 2016-08-27 DIAGNOSIS — N189 Chronic kidney disease, unspecified: Secondary | ICD-10-CM | POA: Insufficient documentation

## 2016-08-27 DIAGNOSIS — N179 Acute kidney failure, unspecified: Secondary | ICD-10-CM

## 2016-08-27 DIAGNOSIS — I10 Essential (primary) hypertension: Secondary | ICD-10-CM | POA: Diagnosis present

## 2016-08-27 DIAGNOSIS — R197 Diarrhea, unspecified: Secondary | ICD-10-CM | POA: Diagnosis not present

## 2016-08-27 DIAGNOSIS — I129 Hypertensive chronic kidney disease with stage 1 through stage 4 chronic kidney disease, or unspecified chronic kidney disease: Secondary | ICD-10-CM | POA: Diagnosis not present

## 2016-08-27 DIAGNOSIS — N183 Chronic kidney disease, stage 3 (moderate): Secondary | ICD-10-CM

## 2016-08-27 DIAGNOSIS — E860 Dehydration: Secondary | ICD-10-CM | POA: Diagnosis not present

## 2016-08-27 DIAGNOSIS — Z79899 Other long term (current) drug therapy: Secondary | ICD-10-CM | POA: Insufficient documentation

## 2016-08-27 DIAGNOSIS — Z87891 Personal history of nicotine dependence: Secondary | ICD-10-CM | POA: Diagnosis not present

## 2016-08-27 HISTORY — DX: Gout, unspecified: M10.9

## 2016-08-27 HISTORY — DX: Type 2 diabetes mellitus without complications: E11.9

## 2016-08-27 HISTORY — DX: Unspecified chronic bronchitis: J42

## 2016-08-27 HISTORY — DX: Shortness of breath: R06.02

## 2016-08-27 LAB — CBC WITH DIFFERENTIAL/PLATELET
Basophils Absolute: 0 10*3/uL (ref 0.0–0.1)
Basophils Relative: 0 %
EOS ABS: 0.4 10*3/uL (ref 0.0–0.7)
Eosinophils Relative: 4 %
HCT: 36.6 % — ABNORMAL LOW (ref 39.0–52.0)
HEMOGLOBIN: 12.6 g/dL — AB (ref 13.0–17.0)
LYMPHS ABS: 1.8 10*3/uL (ref 0.7–4.0)
Lymphocytes Relative: 17 %
MCH: 30 pg (ref 26.0–34.0)
MCHC: 34.4 g/dL (ref 30.0–36.0)
MCV: 87.1 fL (ref 78.0–100.0)
MONO ABS: 0.8 10*3/uL (ref 0.1–1.0)
MONOS PCT: 7 %
NEUTROS PCT: 72 %
Neutro Abs: 7.4 10*3/uL (ref 1.7–7.7)
Platelets: 253 10*3/uL (ref 150–400)
RBC: 4.2 MIL/uL — ABNORMAL LOW (ref 4.22–5.81)
RDW: 13 % (ref 11.5–15.5)
WBC: 10.4 10*3/uL (ref 4.0–10.5)

## 2016-08-27 LAB — URINALYSIS, ROUTINE W REFLEX MICROSCOPIC
Bilirubin Urine: NEGATIVE
Glucose, UA: NEGATIVE mg/dL
Hgb urine dipstick: NEGATIVE
KETONES UR: NEGATIVE mg/dL
Leukocytes, UA: NEGATIVE
Nitrite: NEGATIVE
PH: 5 (ref 5.0–8.0)
PROTEIN: 30 mg/dL — AB
Specific Gravity, Urine: 1.025 (ref 1.005–1.030)

## 2016-08-27 LAB — COMPREHENSIVE METABOLIC PANEL
ALK PHOS: 112 U/L (ref 38–126)
ALT: 53 U/L (ref 17–63)
AST: 72 U/L — ABNORMAL HIGH (ref 15–41)
Albumin: 3.5 g/dL (ref 3.5–5.0)
Anion gap: 13 (ref 5–15)
BUN: 29 mg/dL — AB (ref 6–20)
CALCIUM: 7.2 mg/dL — AB (ref 8.9–10.3)
CHLORIDE: 103 mmol/L (ref 101–111)
CO2: 20 mmol/L — AB (ref 22–32)
CREATININE: 2.26 mg/dL — AB (ref 0.61–1.24)
GFR calc Af Amer: 31 mL/min — ABNORMAL LOW (ref >60)
GFR calc non Af Amer: 27 mL/min — ABNORMAL LOW (ref >60)
GLUCOSE: 127 mg/dL — AB (ref 65–99)
Potassium: 3.9 mmol/L (ref 3.5–5.1)
SODIUM: 136 mmol/L (ref 135–145)
Total Bilirubin: 1.8 mg/dL — ABNORMAL HIGH (ref 0.3–1.2)
Total Protein: 8 g/dL (ref 6.5–8.1)

## 2016-08-27 LAB — INFLUENZA PANEL BY PCR (TYPE A & B)
Influenza A By PCR: NEGATIVE
Influenza B By PCR: NEGATIVE

## 2016-08-27 LAB — I-STAT CG4 LACTIC ACID, ED: LACTIC ACID, VENOUS: 1.43 mmol/L (ref 0.5–1.9)

## 2016-08-27 MED ORDER — ASPIRIN EC 81 MG PO TBEC
81.0000 mg | DELAYED_RELEASE_TABLET | Freq: Every day | ORAL | Status: DC
  Administered 2016-08-28: 81 mg via ORAL
  Filled 2016-08-27: qty 1

## 2016-08-27 MED ORDER — METOPROLOL TARTRATE 25 MG PO TABS
25.0000 mg | ORAL_TABLET | Freq: Two times a day (BID) | ORAL | Status: DC
  Administered 2016-08-27 – 2016-08-28 (×2): 25 mg via ORAL
  Filled 2016-08-27 (×2): qty 1

## 2016-08-27 MED ORDER — DEXTROSE 5 % IV SOLN
1.0000 g | INTRAVENOUS | Status: DC
  Administered 2016-08-27: 1 g via INTRAVENOUS
  Filled 2016-08-27 (×2): qty 10

## 2016-08-27 MED ORDER — SODIUM CHLORIDE 0.9 % IV SOLN
INTRAVENOUS | Status: AC
  Administered 2016-08-27: 20:00:00 via INTRAVENOUS

## 2016-08-27 MED ORDER — SODIUM CHLORIDE 0.9% FLUSH
3.0000 mL | Freq: Two times a day (BID) | INTRAVENOUS | Status: DC
  Administered 2016-08-28: 3 mL via INTRAVENOUS

## 2016-08-27 MED ORDER — SODIUM CHLORIDE 0.9 % IV BOLUS (SEPSIS)
1000.0000 mL | Freq: Once | INTRAVENOUS | Status: AC
  Administered 2016-08-27: 1000 mL via INTRAVENOUS

## 2016-08-27 MED ORDER — ACETAMINOPHEN 650 MG RE SUPP: 650.0000 mg | Freq: Four times a day (QID) | RECTAL | Status: DC | PRN

## 2016-08-27 MED ORDER — ACETAMINOPHEN 325 MG PO TABS: 650.0000 mg | ORAL_TABLET | Freq: Four times a day (QID) | ORAL | Status: DC | PRN

## 2016-08-27 MED ORDER — ALBUTEROL SULFATE (2.5 MG/3ML) 0.083% IN NEBU
5.0000 mg | INHALATION_SOLUTION | Freq: Once | RESPIRATORY_TRACT | Status: AC
  Administered 2016-08-27: 5 mg via RESPIRATORY_TRACT
  Filled 2016-08-27: qty 6

## 2016-08-27 MED ORDER — FEBUXOSTAT 40 MG PO TABS
40.0000 mg | ORAL_TABLET | Freq: Every day | ORAL | Status: DC
  Administered 2016-08-28: 40 mg via ORAL
  Filled 2016-08-27: qty 1

## 2016-08-27 MED ORDER — IPRATROPIUM BROMIDE 0.02 % IN SOLN
0.5000 mg | Freq: Once | RESPIRATORY_TRACT | Status: AC
  Administered 2016-08-27: 0.5 mg via RESPIRATORY_TRACT
  Filled 2016-08-27: qty 2.5

## 2016-08-27 MED ORDER — COLCHICINE 0.6 MG PO TABS: 0.6000 mg | ORAL_TABLET | Freq: Two times a day (BID) | ORAL | Status: DC | PRN

## 2016-08-27 MED ORDER — ENOXAPARIN SODIUM 40 MG/0.4ML ~~LOC~~ SOLN
40.0000 mg | SUBCUTANEOUS | Status: DC
  Filled 2016-08-27: qty 0.4

## 2016-08-27 MED ORDER — PANTOPRAZOLE SODIUM 40 MG PO TBEC
40.0000 mg | DELAYED_RELEASE_TABLET | Freq: Every day | ORAL | Status: DC
  Administered 2016-08-28: 40 mg via ORAL
  Filled 2016-08-27: qty 1

## 2016-08-27 MED ORDER — GUAIFENESIN ER 600 MG PO TB12
600.0000 mg | ORAL_TABLET | Freq: Two times a day (BID) | ORAL | Status: DC
  Administered 2016-08-27 – 2016-08-28 (×2): 600 mg via ORAL
  Filled 2016-08-27 (×2): qty 1

## 2016-08-27 MED ORDER — DEXTROSE 5 % IV SOLN
500.0000 mg | INTRAVENOUS | Status: DC
  Administered 2016-08-27: 500 mg via INTRAVENOUS
  Filled 2016-08-27 (×2): qty 500

## 2016-08-27 MED ORDER — DOXAZOSIN MESYLATE 2 MG PO TABS
4.0000 mg | ORAL_TABLET | Freq: Every day | ORAL | Status: DC
  Administered 2016-08-28: 4 mg via ORAL
  Filled 2016-08-27: qty 2

## 2016-08-27 MED ORDER — PRAVASTATIN SODIUM 20 MG PO TABS: 20.0000 mg | ORAL_TABLET | Freq: Every evening | ORAL | Status: DC

## 2016-08-27 MED ORDER — ENOXAPARIN SODIUM 30 MG/0.3ML ~~LOC~~ SOLN
30.0000 mg | SUBCUTANEOUS | Status: DC
  Administered 2016-08-27: 30 mg via SUBCUTANEOUS
  Filled 2016-08-27: qty 0.3

## 2016-08-27 MED ORDER — ALBUTEROL SULFATE (2.5 MG/3ML) 0.083% IN NEBU: 2.5000 mg | INHALATION_SOLUTION | RESPIRATORY_TRACT | Status: DC | PRN

## 2016-08-27 MED ORDER — PROMETHAZINE HCL 25 MG PO TABS: 12.5000 mg | ORAL_TABLET | Freq: Four times a day (QID) | ORAL | Status: DC | PRN

## 2016-08-27 NOTE — ED Provider Notes (Addendum)
[facility]ENCY DEPT Provider Note   CSN: [id number] Arrival date & time: 08/27/16  1312     History   Chief Complaint Chief Complaint  Patient presents with  . Fever  . Cough    HPI Ryan Burke is a 76 y.o. male.  Patient is a 76 year old gentleman with a history of asthma, aortic and mitral regurgitation status post aortic valve replacement, hypertension, hyperlipidemia, chronic kidney disease with baseline creatinine at 1.5 presenting today with the complaint of worsening cough over the last 3 days. States the cough is productive of a white sputum but denies any blood in the sputum. Also in the last 3 days he has been running a temperature of greater than 100 which improves with Tylenol. The last 2 days he has had a poor appetite and family states he has not eaten anything. He has become weaker and weaker and today had an episode of vomiting and diarrhea in the bathroom. He has not received the pneumonia vaccine. They are unknown of sick contacts. He is currently still taking all of his prescribed medications. He denies any abdominal pain and states the chest pain as a sharp pain related to coughing. He does feel short of breath which seems to be worse with any activity. He has been using his inhaler without improvement.   The history is provided by a relative. The history is limited by a language barrier. A language interpreter was used.  Fever   This is a new problem. Episode onset: 3 days ago. The problem occurs daily. The problem has been gradually worsening. Associated symptoms include chest pain, diarrhea, vomiting, congestion and cough. He has tried acetaminophen for the symptoms. The treatment provided no relief.  Cough  Associated symptoms include chest pain.    Past Medical History:  Diagnosis Date  . Aortic regurgitation   . Asthma   . Chest pain at rest   . Chronic kidney disease   . GERD (gastroesophageal reflux disease)   . Heart murmur   . Hyperlipidemia   .  Hypertension   . Mitral regurgitation   . Seizures (HCC) 03/2013  . SOB (shortness of breath)     Patient Active Problem List   Diagnosis Date Noted  . Seizures (HCC) 04/19/2013  . Gout 04/19/2013  . S/P AVR (aortic valve replacement) 05/08/2012  . Atrial fibrillation (HCC) 05/08/2012  . Hypertension   . Hyperlipidemia   . Aortic regurgitation   . Mitral regurgitation   . ELEVATED PROSTATE SPECIFIC ANTIGEN 09/24/2009  . PROTEINURIA 09/20/2009  . AORTIC INSUFFICIENCY, MODERATE 08/02/2009  . HYPERLIPIDEMIA 07/30/2009  . HYPERGLYCEMIA 07/24/2009  . LIVER FUNCTION TESTS, ABNORMAL, HX OF 07/24/2009  . Essential hypertension, benign 07/22/2009    Past Surgical History:  Procedure Laterality Date  . ABDOMINAL AORTAGRAM N/A 04/12/2012   Procedure: ABDOMINAL Ronny Flurry;  Surgeon: Pamella Pert, MD;  Location: [facility] CATH LAB;  Service: Cardiovascular;  Laterality: N/A;  . 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   Procedure: AORTIC VALVE REPLACEMENT (AVR);  Surgeon: [name], MD;  Location: [facility] OR;  Service: Open Heart Surgery;  Laterality: N/A;  . CARDIAC CATHETERIZATION    . CATARACT EXTRACTION, BILATERAL    . INTRAOPERATIVE TRANSESOPHAGEAL ECHOCARDIOGRAM  05/04/2012   Procedure: INTRAOPERATIVE TRANSESOPHAGEAL ECHOCARDIOGRAM;  Surgeon: [name], MD;  Location: [facility] OR;  Service: Open Heart Surgery;  Laterality: N/A;  . LEFT AND RIGHT HEART CATHETERIZATION WITH CORONARY ANGIOGRAM N/A 04/12/2012   Procedure: LEFT AND RIGHT HEART CATHETERIZATION WITH CORONARY ANGIOGRAM;  Surgeon: Pamella Pert, MD;  [facility];  Service: Cardiovascular;  Laterality: N/A;       Home Medications    Prior to Admission medications   Medication Sig Start Date End Date Taking? Authorizing Provider  albuterol (PROVENTIL HFA;VENTOLIN HFA) 108 (90 BASE) MCG/ACT inhaler Inhale 2 puffs into the lungs every 6 (six) hours as needed. For shortness of breath    [provider]  aspirin EC  81 MG tablet Take 81 mg by mouth daily.    [provider]  doxazosin (CARDURA) 4 MG tablet Take 4 mg by mouth daily.    [provider]  metoprolol tartrate (LOPRESSOR) 25 MG tablet Take 1 tablet (25 mg total) by mouth 2 (two) times daily. 05/08/12   Barrett, Erin R, PA-C  nitroGLYCERIN (NITROSTAT) 0.4 MG SL tablet Place 0.4 mg under the tongue every 5 (five) minutes x 3 doses as needed. For chest pain    [provider]  omeprazole (PRILOSEC) 20 MG capsule Take 20 mg by mouth 2 (two) times daily.     [provider]  oxyCODONE (ROXICODONE) 5 MG immediate release tablet Take 1 tablet (5 mg total) by mouth every 6 (six) hours as needed for severe pain. 04/17/13   Gwyneth Sprout, MD  pravastatin (PRAVACHOL) 20 MG tablet Take 20 mg by mouth every evening.    [provider]    Family History Family History  Problem Relation Age of Onset  . Heart disease Sister     Social History Social History  Substance Use Topics  . Smoking status: Former Smoker    Packs/day: 0.50    Years: 4.00    Types: Cigarettes    Quit date: 03/30/2001  . Smokeless tobacco: Never Used     Comment: quit alcohol 03  . Alcohol use No     Comment: once  a week     Allergies   Patient has no known allergies.   Review of Systems Review of Systems  Constitutional: Positive for fever.  HENT: Positive for congestion.   Respiratory: Positive for cough.   Cardiovascular: Positive for chest pain.  Gastrointestinal: Positive for diarrhea and vomiting.  All other systems reviewed and are negative.    Physical Exam Updated Vital Signs BP 102/87 (BP Location: Left Arm)   Pulse 89   Temp 98.2 F (36.8 C) (Oral)   Resp (!) 30   SpO2 98%   Physical Exam  Constitutional: He is oriented to person, place, and time. He appears well-developed and well-nourished. No distress.  HENT:  Head: Normocephalic and atraumatic.  Right Ear: Tympanic membrane normal.  Left Ear:  Tympanic membrane normal.  Mouth/Throat: Oropharynx is clear and moist. Mucous membranes are dry.  Eyes: EOM are normal. Pupils are equal, round, and reactive to light. Right conjunctiva is injected. Left conjunctiva is injected.  Neck: Normal range of motion. Neck supple.  Cardiovascular: Normal rate, regular rhythm and intact distal pulses.   No murmur heard. Pulmonary/Chest: Effort normal. No respiratory distress. He has no wheezes. He has rales in the right lower field.  Final crackles present in the right lower lobe  Abdominal: Soft. He exhibits no distension. There is no tenderness. There is no rebound and no guarding.  Musculoskeletal: Normal range of motion. He exhibits no edema or tenderness.  Neurological: He is alert and oriented to person, place, and time.  Skin: Skin is warm and dry. No rash noted. No erythema.  Psychiatric: He has a  normal mood and affect. His behavior is normal.  Nursing note and vitals reviewed.    ED Treatments / Results  Labs (all labs ordered are listed, but only abnormal results are displayed) Labs Reviewed  COMPREHENSIVE METABOLIC PANEL - Abnormal; Notable for the following:       Result Value   CO2 20 (*)    Glucose, Bld 127 (*)    BUN 29 (*)    Creatinine, Ser 2.26 (*)    Calcium 7.2 (*)    AST 72 (*)    Total Bilirubin 1.8 (*)    GFR calc non Af Amer 27 (*)    GFR calc Af Amer 31 (*)    All other components within normal limits  CBC WITH DIFFERENTIAL/PLATELET - Abnormal; Notable for the following:    RBC 4.20 (*)    Hemoglobin 12.6 (*)    HCT 36.6 (*)    All other components within normal limits  URINALYSIS, ROUTINE W REFLEX MICROSCOPIC - Abnormal; Notable for the following:    Color, Urine AMBER (*)    APPearance HAZY (*)    Protein, ur 30 (*)    Bacteria, UA RARE (*)    Squamous Epithelial / LPF 0-5 (*)    All other components within normal limits  RESPIRATORY PANEL BY PCR  INFLUENZA PANEL BY PCR (TYPE A & B)  I-STAT CG4 LACTIC  ACID, ED    EKG  EKG Interpretation None       Radiology Dg Chest 2 View  Result Date: 08/27/2016 CLINICAL DATA:  76 year old with mid to left-sided chest pain and shortness of breath over the past 3 days and fever over the past 4 days. Former smoker. Current history of hypertension. EXAM: CHEST  2 VIEW COMPARISON:  04/19/2013, 03/30/2013 and earlier, including CT chest 11/07/2009. FINDINGS: Prior sternotomy for aortic valve replacement. Cardiac silhouette normal in size, unchanged. Thoracic aorta mildly tortuous and atherosclerotic, unchanged. Hilar and mediastinal contours otherwise unremarkable. Interstitial fibrosis involving both lower lobes and the right upper lobe, progressive since the prior examinations, associated with low lung volumes. No new pulmonary parenchymal abnormalities. Normal pulmonary vascularity. No pleural effusions. Degenerative changes involving the thoracic and upper lumbar spine. IMPRESSION: 1.  No acute cardiopulmonary disease. 2. 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 Electronically Signed   By: Hulan Saas M.D.   On: 08/27/2016 15:16    Procedures Procedures (including critical care time)  Medications Ordered in ED Medications  albuterol (PROVENTIL) (2.5 MG/3ML) 0.083% nebulizer solution 5 mg (not administered)  ipratropium (ATROVENT) nebulizer solution 0.5 mg (not administered)  sodium chloride 0.9 % bolus 1,000 mL (not administered)     Initial Impression / Assessment and Plan / ED Course  I have reviewed the triage vital signs and the nursing notes.  Pertinent labs & imaging results that were available during my care of the patient were reviewed by me and considered in my medical decision making (see chart for details).     Patient is an elderly gentleman presenting with symptoms concerning for early sepsis from a potential respiratory cause. Also possibility the patient has a viral  cause of his symptoms. He is tachypnea on exam without significant wheezing. He does have fine rales and crackles in the right lower lobe. He has no evidence of fluid overload at this time. He does appear dehydrated and family states he has not had any by mouth intake in the last 2 days. Patient has had  fever intermittently for the last 3 days. He took Tylenol prior to arrival and is afebrile here. Sepsis protocol initiated prior to evaluation of the patient. He has a normal lactate but has new acute kidney injury today with a creatinine of 2.26 from his baseline of 1.4. Feel that this is most likely prerenal due to dehydration. CBC without acute findings.  Pt given IVF. CXR with signs of pulm fibrosis which is progressive but no focal PNA.  However concern for such.  Will admit for further care.  5:20 PM Flu and RSV panel ordered.  Pt started on azithro/rocephin for possible CAP.  Final Clinical Impressions(s) / ED Diagnoses   Final diagnoses:  Dehydration  Fever, unspecified fever cause  Upper respiratory tract infection, unspecified type    New Prescriptions New Prescriptions   No medications on file     Gwyneth SproutPlunkett, Makana Rostad, MD 08/27/16 Berkley [id number]    Mali Eppard, MD 08/27/16 1723

## 2016-08-27 NOTE — ED Triage Notes (Signed)
Pt c/o shortness of breath and cold symptoms for 3 days-- subjective fever at home, received tylenol per son--  Pt speaks [place]ali, son is fluent in [place].

## 2016-08-27 NOTE — H&P (Signed)
History and Physical    BIFF RUTIGLIANO WUJ:[id number] DOB: 10-03-1940 DOA: 08/27/2016  PCP: Fleet Contras, MD   I have briefly reviewed patients previous medical reports in [facility].  Patient coming from: Home  Chief Complaint: Fever, Cough, weakness, vomiting and diarrhea  HPI: Ryan Burke is a 76 year old married male, lives with his adult son and his family, non-English speaking/Nepalese, ambulates with the help of a cane, PMH of tissue AVR, HTN, HLD, stage III chronic kidney disease, last known creatinine of 1.4 in 2015, presented to ED with above complaints. Patient's son who speaks English interpreted history (patient's preference). Patient was in usual state of health until 4-5 days ago when he started noticing dry cough and intermittent high fevers (to touch but temperatures not recorded). Fevers temporized with Tylenol only to recur. Over the last 24 hours, cough has gotten worse, productive of white sputum, associated with dyspnea and anterior chest pain while coughing. He had 3 episodes of loose stools yesterday and 2 episodes today. He had an episode of nonbloody emesis this morning. Appetite is poor and patient has progressively become weak. Exposed to his 2-year-old grandson who lives next door man has a cough.  ED Course: Lab work significant for creatinine of 2.26. Urine microscopy not suggestive of UTI. 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 Concern for developing pneumonia.  Review of Systems:  All other systems reviewed and apart from HPI, are negative.  Past Medical History:  Diagnosis Date  . Aortic regurgitation   . Asthma   . Chest pain at rest   . Chronic kidney disease   . GERD (gastroesophageal reflux disease)   . Heart murmur   . Hyperlipidemia   . Hypertension   . Mitral regurgitation   . Seizures (HCC) 03/2013  . SOB (shortness of breath)       Past Surgical History:  Procedure Laterality Date  . ABDOMINAL AORTAGRAM N/A 04/12/2012   Procedure: ABDOMINAL Ronny Flurry;  Surgeon: Pamella Pert, MD;  Location: [facility] CATH LAB;  Service: Cardiovascular;  Laterality: N/A;  . 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   Procedure: AORTIC VALVE REPLACEMENT (AVR);  Surgeon: [name], MD;  Location: [facility] OR;  Service: Open Heart Surgery;  Laterality: N/A;  . CARDIAC CATHETERIZATION    . CATARACT EXTRACTION, BILATERAL    . INTRAOPERATIVE TRANSESOPHAGEAL ECHOCARDIOGRAM  05/04/2012   Procedure: INTRAOPERATIVE TRANSESOPHAGEAL ECHOCARDIOGRAM;  Surgeon: [name], MD;  Location: [facility] OR;  Service: Open Heart Surgery;  Laterality: N/A;  . LEFT AND RIGHT HEART CATHETERIZATION WITH CORONARY ANGIOGRAM N/A 04/12/2012   Procedure: LEFT AND RIGHT HEART CATHETERIZATION WITH CORONARY ANGIOGRAM;  Surgeon: Pamella Pert, MD;  Location: [facility] CATH LAB;  Service: Cardiovascular;  Laterality: N/A;    Social History  reports that he quit smoking about 15 years ago. His smoking use included Cigarettes. He has a 2.00 pack-year smoking history. He has never used smokeless tobacco. He reports that he does not drink alcohol or use drugs.  No Known Allergies  Family History  Problem Relation Age of Onset  . Heart disease Sister      Prior to Admission medications   Medication Sig Start Date End Date Taking? Authorizing Provider  albuterol (PROVENTIL HFA;VENTOLIN HFA) 108 (90 BASE) MCG/ACT inhaler Inhale 2 puffs into the lungs every 6 (six) hours as needed. For shortness of breath   Yes [provider]  aspirin EC  81 MG tablet Take 81 mg by mouth daily.   Yes [provider]  colchicine 0.6 MG tablet Take 0.6 mg by mouth 2 (two) times daily as needed (gout).   Yes [provider]  doxazosin (CARDURA) 4 MG tablet Take 4 mg by mouth daily.   Yes [provider]  febuxostat (ULORIC) 40 MG tablet Take 40 mg by mouth daily.   Yes  [provider]  lisinopril-hydrochlorothiazide (PRINZIDE,ZESTORETIC) 20-25 MG tablet Take 1 tablet by mouth every morning.   Yes [provider]  metoprolol tartrate (LOPRESSOR) 25 MG tablet Take 1 tablet (25 mg total) by mouth 2 (two) times daily. 05/08/12  Yes Barrett, Erin R, PA-C  omeprazole (PRILOSEC) 20 MG capsule Take 20 mg by mouth 2 (two) times daily.    Yes [provider]  pravastatin (PRAVACHOL) 20 MG tablet Take 20 mg by mouth every evening.   Yes [provider]  oxyCODONE (ROXICODONE) 5 MG immediate release tablet Take 1 tablet (5 mg total) by mouth every 6 (six) hours as needed for severe pain. 04/17/13   Gwyneth Sprout, MD    Physical Exam: Vitals:   08/27/16 1713 08/27/16 1745 08/27/16 1800 08/27/16 1815  BP: 112/75 108/76 113/71 101/73  Pulse: (!) 101 (!) 102 98 96  Resp:      Temp:      TempSrc:      SpO2: 93% 94% 97% 93%  Temperature 98.65F, respiratory rate 30 per minute.    Constitutional: Pleasant elderly male, moderately built and nourished, lying comfortably propped up in bed. Ill looking but not in any discomfort or distress. Eyes: PERTLA, lids and conjunctivae normal. Bilateral peripheral iridotomies. ENMT: Mucous membranes are dry. Posterior pharynx clear of any exudate or lesions. Normal dentition.  Neck: supple, no masses, no thyromegaly Respiratory: Slightly harsh breath sounds bilaterally. Fine basal crackles. No wheezing or rhonchi. No increased work of breathing. Cardiovascular: S1 & S2 heard, regular rate and rhythm, no murmurs / rubs / gallops. No extremity edema. 2+ pedal pulses. No carotid bruits.  Abdomen: No distension, no tenderness, no masses palpated. No hepatosplenomegaly. Bowel sounds normal.  Musculoskeletal: no clubbing / cyanosis. No joint deformity upper and lower extremities. Good ROM, no contractures. Normal muscle tone.  Skin: no rashes, lesions, ulcers. No induration Neurologic: CN 2-12 grossly  intact. Sensation intact, DTR normal. Strength 5/5 in all 4 limbs.  Psychiatric: Normal judgment and insight. Alert and oriented x 3. Normal mood.     Labs on Admission: I have personally reviewed following labs and imaging studies  CBC:  Recent Labs Lab 08/27/16 1431  WBC 10.4  NEUTROABS 7.4  HGB 12.6*  HCT 36.6*  MCV 87.1  PLT 253   Basic Metabolic Panel:  Recent Labs Lab 08/27/16 1431  NA 136  K 3.9  CL 103  CO2 20*  GLUCOSE 127*  BUN 29*  CREATININE 2.26*  CALCIUM 7.2*   Liver Function Tests:  Recent Labs Lab 08/27/16 1431  AST 72*  ALT 53  ALKPHOS 112  BILITOT 1.8*  PROT 8.0  ALBUMIN 3.5   Urine analysis:    Component Value Date/Time   COLORURINE AMBER (A) 08/27/2016 1654   APPEARANCEUR HAZY (A) 08/27/2016 1654   LABSPEC 1.025 08/27/2016 1654   PHURINE 5.0 08/27/2016 1654   GLUCOSEU NEGATIVE 08/27/2016 1654   GLUCOSEU NEG mg/dL 16/12/9602 5409   HGBUR NEGATIVE 08/27/2016 1654   HGBUR negative 09/20/2009 1029   BILIRUBINUR NEGATIVE 08/27/2016 1654   KETONESUR  NEGATIVE 08/27/2016 1654   PROTEINUR 30 (A) 08/27/2016 1654   UROBILINOGEN 0.2 04/19/2013 0355   NITRITE NEGATIVE 08/27/2016 1654   LEUKOCYTESUR NEGATIVE 08/27/2016 1654     Radiological Exams on Admission: Dg Chest 2 View  Result Date: 08/27/2016 CLINICAL DATA:  [age] year old with mid to left-sided chest pain and shortness of breath over the past 3 days and fever over the past 4 days. Former smoker. Current history of hypertension. EXAM: CHEST  2 VIEW COMPARISON:  04/19/2013, 03/30/2013 and earlier, including CT chest 11/07/2009. FINDINGS: Prior sternotomy for aortic valve replacement. Cardiac silhouette normal in size, unchanged. Thoracic aorta mildly tortuous and atherosclerotic, unchanged. Hilar and mediastinal contours otherwise unremarkable. Interstitial fibrosis involving both lower lobes and the right upper lobe, progressive since the prior examinations, associated with low lung  volumes. No new pulmonary parenchymal abnormalities. Normal pulmonary vascularity. No pleural effusions. Degenerative changes involving the thoracic and upper lumbar spine. IMPRESSION: 1.  No acute cardiopulmonary disease. 2. 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 Electronically Signed   By: Hulan Saashomas  Lawrence M.D.   On: 08/27/2016 15:16     Assessment/Plan Principal Problem:   Acute bronchitis Active Problems:   Essential hypertension, benign   Hyperlipidemia   S/P AVR (aortic valve replacement)     1. Acute bronchitis: Could be developing community-acquired pneumonia. Check influenza PCR (less likely but noted rare cases at this time), RSV panel PCR, droplet isolation, follow-up repeat chest x-ray in a.m. post-hydration to look for developing pneumonia. Continue IV ceftriaxone and azithromycin initiated by EDP. Supportive treatment. 2. Dehydration: Secondary to poor oral intake and GI losses. Brief IV fluids. 3. Acute on stage III chronic kidney disease: Last known creatinine on 04/20/13:1.4. Baseline creatinine not known. He may have some degree of acute kidney injury complicating chronic kidney disease which may have progressed. Acute kidney injury secondary to dehydration and ACEI. Hold culprit medications. Brief IV fluids and follow BMP in a.m. 4. Essential hypertension: Controlled. Hold HCTZ/lisinopril. Continue metoprolol. Continue Cardura. 5. Hyperlipidemia: Statins. 6. Vomiting and diarrhea: May be viral related to problem #1. Supportive treatment. Seems to have improved spontaneously. 7. GERD: Continue PPI. 8. Gout: Continue home medications. No exacerbation.   DVT prophylaxis: Lovenox  Code Status: Full  Family Communication: Discussed in detail with patient's son at bedside.  Disposition Plan: Discharge home when medically improved  Consults called: None  Admission status: Telemetry, observation.    [facility] Vincent Health CareNGALGI,Ardath Lepak  MD [facility] Pager [phone]  If 7PM-7AM, please contact night-coverage [email or web address] Password [email or web address]  08/27/2016, 6:57 PM

## 2016-08-28 ENCOUNTER — Observation Stay (HOSPITAL_COMMUNITY): Payer: Medicare Other

## 2016-08-28 DIAGNOSIS — I10 Essential (primary) hypertension: Secondary | ICD-10-CM | POA: Diagnosis not present

## 2016-08-28 DIAGNOSIS — E785 Hyperlipidemia, unspecified: Secondary | ICD-10-CM | POA: Diagnosis not present

## 2016-08-28 DIAGNOSIS — Z952 Presence of prosthetic heart valve: Secondary | ICD-10-CM

## 2016-08-28 DIAGNOSIS — R509 Fever, unspecified: Secondary | ICD-10-CM | POA: Diagnosis not present

## 2016-08-28 DIAGNOSIS — E860 Dehydration: Secondary | ICD-10-CM | POA: Diagnosis not present

## 2016-08-28 DIAGNOSIS — J209 Acute bronchitis, unspecified: Secondary | ICD-10-CM | POA: Diagnosis not present

## 2016-08-28 LAB — RESPIRATORY PANEL BY PCR
Adenovirus: NOT DETECTED
Bordetella pertussis: NOT DETECTED
CORONAVIRUS 229E-RVPPCR: NOT DETECTED
CORONAVIRUS HKU1-RVPPCR: NOT DETECTED
CORONAVIRUS NL63-RVPPCR: NOT DETECTED
CORONAVIRUS OC43-RVPPCR: NOT DETECTED
Chlamydophila pneumoniae: NOT DETECTED
INFLUENZA B-RVPPCR: NOT DETECTED
Influenza A: NOT DETECTED
MYCOPLASMA PNEUMONIAE-RVPPCR: NOT DETECTED
Metapneumovirus: NOT DETECTED
PARAINFLUENZA VIRUS 1-RVPPCR: NOT DETECTED
Parainfluenza Virus 2: NOT DETECTED
Parainfluenza Virus 3: NOT DETECTED
Parainfluenza Virus 4: NOT DETECTED
Respiratory Syncytial Virus: NOT DETECTED
Rhinovirus / Enterovirus: NOT DETECTED

## 2016-08-28 LAB — BASIC METABOLIC PANEL
ANION GAP: 12 (ref 5–15)
BUN: 21 mg/dL — ABNORMAL HIGH (ref 6–20)
CHLORIDE: 106 mmol/L (ref 101–111)
CO2: 19 mmol/L — AB (ref 22–32)
Calcium: 6.4 mg/dL — CL (ref 8.9–10.3)
Creatinine, Ser: 1.79 mg/dL — ABNORMAL HIGH (ref 0.61–1.24)
GFR calc non Af Amer: 35 mL/min — ABNORMAL LOW (ref >60)
GFR, EST AFRICAN AMERICAN: 41 mL/min — AB (ref >60)
Glucose, Bld: 119 mg/dL — ABNORMAL HIGH (ref 65–99)
Potassium: 3.2 mmol/L — ABNORMAL LOW (ref 3.5–5.1)
Sodium: 137 mmol/L (ref 135–145)

## 2016-08-28 LAB — CBC
HEMATOCRIT: 30.3 % — AB (ref 39.0–52.0)
Hemoglobin: 10.4 g/dL — ABNORMAL LOW (ref 13.0–17.0)
MCH: 30.2 pg (ref 26.0–34.0)
MCHC: 34.3 g/dL (ref 30.0–36.0)
MCV: 88.1 fL (ref 78.0–100.0)
Platelets: 206 10*3/uL (ref 150–400)
RBC: 3.44 MIL/uL — AB (ref 4.22–5.81)
RDW: 13.1 % (ref 11.5–15.5)
WBC: 7.7 10*3/uL (ref 4.0–10.5)

## 2016-08-28 MED ORDER — SODIUM CHLORIDE 0.9 % IV SOLN
1.0000 g | Freq: Once | INTRAVENOUS | Status: AC
  Administered 2016-08-28: 1 g via INTRAVENOUS
  Filled 2016-08-28: qty 10

## 2016-08-28 MED ORDER — AZITHROMYCIN 250 MG PO TABS: 250.0000 mg | ORAL_TABLET | Freq: Every day | ORAL | 0 refills | Status: DC

## 2016-08-28 MED ORDER — POTASSIUM CHLORIDE CRYS ER 20 MEQ PO TBCR
40.0000 meq | EXTENDED_RELEASE_TABLET | Freq: Once | ORAL | Status: AC
  Administered 2016-08-28: 40 meq via ORAL
  Filled 2016-08-28: qty 2

## 2016-08-28 NOTE — Discharge Summary (Signed)
Physician Discharge Summary  JAIVIAN BATTAGLINI ZOX:[id number] DOB: [birth year] DOA: 08/27/2016  PCP: Fleet Contras, MD  Admit date: 08/27/2016 Discharge date: 08/28/2016  Admitted From: Home Disposition: Home   Recommendations for Outpatient Follow-up:  1. Follow up with PCP in 1-2 weeks 2. Monitor BMP  Home Health: None Equipment/Devices: None Discharge Condition: Stable CODE STATUS: Full Diet recommendation: Heart healthy  Brief/Interim Summary: Ryan Burke is a 76 year old married male, lives with his adult son and his family, non-English speaking/Nepalese, ambulates with the help of a cane, PMH of tissue AVR, HTN, HLD, stage III chronic kidney disease, last known creatinine of 1.4 in 2015, presented to ED with above complaints. Patient's son who speaks English interpreted history (patient's preference). Patient was in usual state of health until 4-5 days ago when he started noticing dry cough and intermittent high fevers (to touch but temperatures not recorded). Fevers temporized with Tylenol only to recur. Over the last 24 hours, cough has gotten worse, productive of white sputum, associated with dyspnea and anterior chest pain while coughing. He had 3 episodes of loose stools yesterday and 2 episodes today. He had an episode of nonbloody emesis this morning. Appetite is poor and patient has progressively become weak. Reports a sick contact. Initial lab work significant for creatinine of 2.26. Urine microscopy not suggestive of UTI. 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 Concern for developing pneumonia.  Discharge Diagnoses:  Principal Problem:   Acute bronchitis Active Problems:   Essential hypertension, benign   Hyperlipidemia   S/P AVR (aortic valve replacement)  Acute bronchitis and suspect community-acquired pneumonia: Viral panel and flu negative. CXR shows interstitial  prominence. Improving, no respiratory distress.  - Continue azithromycin x5 days - Continue inhaled bronchodilators as needed - Follow up with PCP in next week, consider pulmonology referral of interstitial lung disease is suspected.   Dehydration: Secondary to poor oral intake and GI losses. Provided brief IV fluids with improvement in AKI. With vascular prominence and cardiomegaly on CXR, will DC IVF and continue per oral hydration.  Acute kidney injury on stage III chronic kidney disease: Last known creatinine on 04/20/13: 1.4. Baseline creatinine not known. Acute kidney injury secondary to dehydration and ACEI. Improved with IVF's 2.26 > 1.79 - Hold HCTZ and lisinopril until follow up (BPs low/normotensive here)  Essential hypertension: Controlled.  - Hold HCTZ/lisinopril. Continue metoprolol. Continue Cardura.  Hypocalcemia: Without tetany. Repleted along with potassium. Suspect GI losses.  - Recommend recheck at follow up. Consider hypoparathyroidism work up if continues.  - Holding HCTZ as above due to low BP.   History of CABG, s/p AVR: No chest pain. Followed by Dr. Jacinto Halim. - Continue statin, ASA and beta blocker - No edema, JVD on exam.   Vomiting and diarrhea: May be related to problem #1. -  Supportive treatment. Seems to have improved spontaneously.  GERD:  - Continue PPI.  Gout:  - Continue home medications. No exacerbation.  Discharge Instructions Discharge Instructions    Diet - low sodium heart healthy    Complete by:  As directed    Discharge instructions    Complete by:  As directed    You have pneumonia which will be treated with antibiotics. You are stable for discharge with the following recommendations:  - TAKE azithromycin every day until you run out of medication - Use lozenges/cough drops to help suppress the cough.  - Stay hydrated by drinking  plenty of fluids - STOP taking lisinopril/HCTZ until you follow up with your doctor - Schedule an appointment  with your doctor (information below, call today) in the next 1 - 2 weeks - If your symptoms worsen instead of improving, seek medical attention right away.   Increase activity slowly    Complete by:  As directed      Allergies as of 08/28/2016   No Known Allergies     Medication List    STOP taking these medications   lisinopril-hydrochlorothiazide 20-25 MG tablet Commonly known as:  PRINZIDE,ZESTORETIC     TAKE these medications   albuterol 108 (90 Base) MCG/ACT inhaler Commonly known as:  PROVENTIL HFA;VENTOLIN HFA Inhale 2 puffs into the lungs every 6 (six) hours as needed. For shortness of breath   aspirin EC 81 MG tablet Take 81 mg by mouth daily.   azithromycin 250 MG tablet Commonly known as:  ZITHROMAX Z-PAK Take 1 tablet (250 mg total) by mouth daily.   colchicine 0.6 MG tablet Take 0.6 mg by mouth 2 (two) times daily as needed (gout).   doxazosin 4 MG tablet Commonly known as:  CARDURA Take 4 mg by mouth daily.   febuxostat 40 MG tablet Commonly known as:  ULORIC Take 40 mg by mouth daily.   metoprolol tartrate 25 MG tablet Commonly known as:  LOPRESSOR Take 1 tablet (25 mg total) by mouth 2 (two) times daily.   omeprazole 20 MG capsule Commonly known as:  PRILOSEC Take 20 mg by mouth 2 (two) times daily.   oxyCODONE 5 MG immediate release tablet Commonly known as:  ROXICODONE Take 1 tablet (5 mg total) by mouth every 6 (six) hours as needed for severe pain.   pravastatin 20 MG tablet Commonly known as:  PRAVACHOL Take 20 mg by mouth every evening.      Follow-up Information    Fleet ContrasAvbuere, Edwin, MD. Schedule an appointment as soon as possible for a visit in 1 week(s).   Specialty:  Internal Medicine Contact information: [address] [place] [address] [phone]          No Known Allergies  Consultations:  None  Procedures/Studies: X-ray Chest Pa And Lateral  Result Date: 08/28/2016 CLINICAL DATA:  Follow-up bronchitis.  EXAM: CHEST  2 VIEW COMPARISON:  08/27/2016 . FINDINGS: Prior CABG. Cardiomegaly. Mild increase interstitial prominence. Mild CHF cannot be excluded. A component of underlying chronic interstitial lung disease is present. Mild bilateral pleural effusions. No pneumothorax. No acute bony abnormality . IMPRESSION: Prior CABG and cardiac valve replacement. Cardiomegaly with mild increase pulmonary interstitial prominence. Mild CHF cannot be excluded. A component of underlying chronic interstitial lung disease is present. Electronically Signed   By: Maisie Fushomas  Register   On: 08/28/2016 08:15   Dg Chest 2 View  Result Date: 08/27/2016 CLINICAL DATA:  76 year old with mid to left-sided chest pain and shortness of breath over the past 3 days and fever over the past 4 days. Former smoker. Current history of hypertension. EXAM: CHEST  2 VIEW COMPARISON:  04/19/2013, 03/30/2013 and earlier, including CT chest 11/07/2009. FINDINGS: Prior sternotomy for aortic valve replacement. Cardiac silhouette normal in size, unchanged. Thoracic aorta mildly tortuous and atherosclerotic, unchanged. Hilar and mediastinal contours otherwise unremarkable. Interstitial fibrosis involving both lower lobes and the right upper lobe, progressive since the prior examinations, associated with low lung volumes. No new pulmonary parenchymal abnormalities. Normal pulmonary vascularity. No pleural effusions. Degenerative changes involving the thoracic and upper lumbar spine. IMPRESSION: 1.  No  acute cardiopulmonary disease. 2. 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 Electronically Signed   By: Hulan Saas M.D.   On: 08/27/2016 15:16   Subjective: No dyspnea or chest pain. Cough is stable, possibly improving. No fevers.   Discharge Exam: BP (!) 116/59   Pulse 91   Temp 98.3 F (36.8 C) (Oral)   Resp 16   Ht 5\' 5"  (1.651 m)   Wt 71.8 kg (158 lb 4.6 oz)   SpO2 96%   BMI 26.34 kg/m    General: Pt is alert, awake, not in acute distress Cardiovascular: RRR, S1/S2 +, no rubs, no gallops Respiratory: Nonlabored on room air, coarse dispersed breath sounds with mild bibasilar crackles, no wheezing. Frequent nonproductive coughing. Abdominal: Soft, NT, ND, bowel sounds + Extremities: No edema, no cyanosis  Labs: Basic Metabolic Panel:  Recent Labs Lab 08/27/16 1431 08/28/16 0628  NA 136 137  K 3.9 3.2*  CL 103 106  CO2 20* 19*  GLUCOSE 127* 119*  BUN 29* 21*  CREATININE 2.26* 1.79*  CALCIUM 7.2* 6.4*   Liver Function Tests:  Recent Labs Lab 08/27/16 1431  AST 72*  ALT 53  ALKPHOS 112  BILITOT 1.8*  PROT 8.0  ALBUMIN 3.5   CBC:  Recent Labs Lab 08/27/16 1431 08/28/16 0628  WBC 10.4 7.7  NEUTROABS 7.4  --   HGB 12.6* 10.4*  HCT 36.6* 30.3*  MCV 87.1 88.1  PLT 253 206   Urinalysis    Component Value Date/Time   COLORURINE AMBER (A) 08/27/2016 1654   APPEARANCEUR HAZY (A) 08/27/2016 1654   LABSPEC 1.025 08/27/2016 1654   PHURINE 5.0 08/27/2016 1654   GLUCOSEU NEGATIVE 08/27/2016 1654   GLUCOSEU NEG mg/dL 91/47/8295 6213   HGBUR NEGATIVE 08/27/2016 1654   HGBUR negative 09/20/2009 1029   BILIRUBINUR NEGATIVE 08/27/2016 1654   KETONESUR NEGATIVE 08/27/2016 1654   PROTEINUR 30 (A) 08/27/2016 1654   UROBILINOGEN 0.2 04/19/2013 0355   NITRITE NEGATIVE 08/27/2016 1654   LEUKOCYTESUR NEGATIVE 08/27/2016 1654    Microbiology Recent Results (from the past 240 hour(s))  Respiratory Panel by PCR     Status: None   Collection Time: 08/27/16  6:06 PM  Result Value Ref Range Status   Adenovirus NOT DETECTED NOT DETECTED Final   Coronavirus 229E NOT DETECTED NOT DETECTED Final   Coronavirus HKU1 NOT DETECTED NOT DETECTED Final   Coronavirus NL63 NOT DETECTED NOT DETECTED Final   Coronavirus OC43 NOT DETECTED NOT DETECTED Final   Metapneumovirus NOT DETECTED NOT DETECTED Final   Rhinovirus / Enterovirus NOT DETECTED NOT DETECTED Final    Influenza A NOT DETECTED NOT DETECTED Final   Influenza B NOT DETECTED NOT DETECTED Final   Parainfluenza Virus 1 NOT DETECTED NOT DETECTED Final   Parainfluenza Virus 2 NOT DETECTED NOT DETECTED Final   Parainfluenza Virus 3 NOT DETECTED NOT DETECTED Final   Parainfluenza Virus 4 NOT DETECTED NOT DETECTED Final   Respiratory Syncytial Virus NOT DETECTED NOT DETECTED Final   Bordetella pertussis NOT DETECTED NOT DETECTED Final   Chlamydophila pneumoniae NOT DETECTED NOT DETECTED Final   Mycoplasma pneumoniae NOT DETECTED NOT DETECTED Final    Time coordinating discharge: Approximately 40 minutes  Hazeline Junker, MD  [facility] 08/28/2016, 12:39 PM Pager [phone]

## 2016-08-28 NOTE — Discharge Summary (Signed)
Pt given discharge instructions and prescription. Nepali interpreter used. Denies questions. Family to drive pt home.

## 2019-01-30 ENCOUNTER — Other Ambulatory Visit: Payer: Self-pay

## 2019-01-30 DIAGNOSIS — Z20822 Contact with and (suspected) exposure to covid-19: Secondary | ICD-10-CM

## 2019-02-01 LAB — NOVEL CORONAVIRUS, NAA: SARS-CoV-2, NAA: DETECTED — AB

## 2019-02-06 ENCOUNTER — Inpatient Hospital Stay (HOSPITAL_COMMUNITY)
Admission: EM | Admit: 2019-02-06 | Discharge: 2019-02-14 | DRG: 177 | Disposition: A | Discharge status: Home Health | Payer: Medicare Other | Attending: Internal Medicine | Admitting: Internal Medicine

## 2019-02-06 ENCOUNTER — Other Ambulatory Visit: Payer: Self-pay

## 2019-02-06 ENCOUNTER — Emergency Department (HOSPITAL_COMMUNITY): Payer: Medicare Other

## 2019-02-06 ENCOUNTER — Encounter (HOSPITAL_COMMUNITY): Payer: Self-pay | Admitting: Internal Medicine

## 2019-02-06 DIAGNOSIS — E872 Acidosis: Secondary | ICD-10-CM | POA: Diagnosis present

## 2019-02-06 DIAGNOSIS — J069 Acute upper respiratory infection, unspecified: Secondary | ICD-10-CM | POA: Diagnosis not present

## 2019-02-06 DIAGNOSIS — Z8615 Personal history of latent tuberculosis infection: Secondary | ICD-10-CM

## 2019-02-06 DIAGNOSIS — N1832 Chronic kidney disease, stage 3b: Secondary | ICD-10-CM | POA: Diagnosis present

## 2019-02-06 DIAGNOSIS — I2510 Atherosclerotic heart disease of native coronary artery without angina pectoris: Secondary | ICD-10-CM | POA: Diagnosis present

## 2019-02-06 DIAGNOSIS — Z8249 Family history of ischemic heart disease and other diseases of the circulatory system: Secondary | ICD-10-CM

## 2019-02-06 DIAGNOSIS — Z951 Presence of aortocoronary bypass graft: Secondary | ICD-10-CM | POA: Diagnosis not present

## 2019-02-06 DIAGNOSIS — J42 Unspecified chronic bronchitis: Secondary | ICD-10-CM | POA: Diagnosis present

## 2019-02-06 DIAGNOSIS — M109 Gout, unspecified: Secondary | ICD-10-CM | POA: Diagnosis present

## 2019-02-06 DIAGNOSIS — E1165 Type 2 diabetes mellitus with hyperglycemia: Secondary | ICD-10-CM | POA: Diagnosis present

## 2019-02-06 DIAGNOSIS — U071 COVID-19: Principal | ICD-10-CM | POA: Diagnosis present

## 2019-02-06 DIAGNOSIS — E785 Hyperlipidemia, unspecified: Secondary | ICD-10-CM | POA: Diagnosis present

## 2019-02-06 DIAGNOSIS — Z952 Presence of prosthetic heart valve: Secondary | ICD-10-CM

## 2019-02-06 DIAGNOSIS — J1289 Other viral pneumonia: Secondary | ICD-10-CM | POA: Diagnosis present

## 2019-02-06 DIAGNOSIS — K219 Gastro-esophageal reflux disease without esophagitis: Secondary | ICD-10-CM | POA: Diagnosis present

## 2019-02-06 DIAGNOSIS — E1122 Type 2 diabetes mellitus with diabetic chronic kidney disease: Secondary | ICD-10-CM | POA: Diagnosis present

## 2019-02-06 DIAGNOSIS — J9601 Acute respiratory failure with hypoxia: Secondary | ICD-10-CM | POA: Diagnosis present

## 2019-02-06 DIAGNOSIS — I4891 Unspecified atrial fibrillation: Secondary | ICD-10-CM | POA: Diagnosis present

## 2019-02-06 DIAGNOSIS — Z953 Presence of xenogenic heart valve: Secondary | ICD-10-CM | POA: Diagnosis not present

## 2019-02-06 DIAGNOSIS — E1169 Type 2 diabetes mellitus with other specified complication: Secondary | ICD-10-CM | POA: Diagnosis present

## 2019-02-06 DIAGNOSIS — Z7982 Long term (current) use of aspirin: Secondary | ICD-10-CM

## 2019-02-06 DIAGNOSIS — Z7984 Long term (current) use of oral hypoglycemic drugs: Secondary | ICD-10-CM

## 2019-02-06 DIAGNOSIS — I129 Hypertensive chronic kidney disease with stage 1 through stage 4 chronic kidney disease, or unspecified chronic kidney disease: Secondary | ICD-10-CM | POA: Diagnosis present

## 2019-02-06 DIAGNOSIS — Z79899 Other long term (current) drug therapy: Secondary | ICD-10-CM

## 2019-02-06 DIAGNOSIS — Z87891 Personal history of nicotine dependence: Secondary | ICD-10-CM

## 2019-02-06 DIAGNOSIS — R569 Unspecified convulsions: Secondary | ICD-10-CM

## 2019-02-06 DIAGNOSIS — I10 Essential (primary) hypertension: Secondary | ICD-10-CM

## 2019-02-06 LAB — CBC WITH DIFFERENTIAL/PLATELET
Abs Immature Granulocytes: 0.01 10*3/uL (ref 0.00–0.07)
Basophils Absolute: 0 10*3/uL (ref 0.0–0.1)
Basophils Relative: 0 %
Eosinophils Absolute: 0.1 10*3/uL (ref 0.0–0.5)
Eosinophils Relative: 2 %
HCT: 34.5 % — ABNORMAL LOW (ref 39.0–52.0)
Hemoglobin: 11.5 g/dL — ABNORMAL LOW (ref 13.0–17.0)
Immature Granulocytes: 0 %
Lymphocytes Relative: 21 %
Lymphs Abs: 0.6 10*3/uL — ABNORMAL LOW (ref 0.7–4.0)
MCH: 28.3 pg (ref 26.0–34.0)
MCHC: 33.3 g/dL (ref 30.0–36.0)
MCV: 84.8 fL (ref 80.0–100.0)
Monocytes Absolute: 0.2 10*3/uL (ref 0.1–1.0)
Monocytes Relative: 7 %
Neutro Abs: 1.9 10*3/uL (ref 1.7–7.7)
Neutrophils Relative %: 70 %
Platelets: 92 10*3/uL — ABNORMAL LOW (ref 150–400)
RBC: 4.07 MIL/uL — ABNORMAL LOW (ref 4.22–5.81)
RDW: 14.2 % (ref 11.5–15.5)
WBC: 2.7 10*3/uL — ABNORMAL LOW (ref 4.0–10.5)
nRBC: 0 % (ref 0.0–0.2)

## 2019-02-06 LAB — COMPREHENSIVE METABOLIC PANEL
ALT: 27 U/L (ref 0–44)
AST: 63 U/L — ABNORMAL HIGH (ref 15–41)
Albumin: 2.5 g/dL — ABNORMAL LOW (ref 3.5–5.0)
Alkaline Phosphatase: 161 U/L — ABNORMAL HIGH (ref 38–126)
Anion gap: 6 (ref 5–15)
BUN: 17 mg/dL (ref 8–23)
CO2: 13 mmol/L — ABNORMAL LOW (ref 22–32)
Calcium: 7.2 mg/dL — ABNORMAL LOW (ref 8.9–10.3)
Chloride: 115 mmol/L — ABNORMAL HIGH (ref 98–111)
Creatinine, Ser: 1.76 mg/dL — ABNORMAL HIGH (ref 0.61–1.24)
GFR calc Af Amer: 42 mL/min — ABNORMAL LOW (ref >60)
GFR calc non Af Amer: 36 mL/min — ABNORMAL LOW (ref >60)
Glucose, Bld: 160 mg/dL — ABNORMAL HIGH (ref 70–99)
Potassium: 3.8 mmol/L (ref 3.5–5.1)
Sodium: 134 mmol/L — ABNORMAL LOW (ref 135–145)
Total Bilirubin: 1.3 mg/dL — ABNORMAL HIGH (ref 0.3–1.2)
Total Protein: 5.9 g/dL — ABNORMAL LOW (ref 6.5–8.1)

## 2019-02-06 LAB — D-DIMER, QUANTITATIVE: D-Dimer, Quant: 1.83 ug/mL-FEU — ABNORMAL HIGH (ref 0.00–0.50)

## 2019-02-06 LAB — BLOOD GAS, ARTERIAL
Acid-base deficit: 11.2 mmol/L — ABNORMAL HIGH (ref 0.0–2.0)
Bicarbonate: 12.3 mmol/L — ABNORMAL LOW (ref 20.0–28.0)
O2 Content: 10 L/min
O2 Saturation: 97.3 %
Patient temperature: 98.6
pCO2 arterial: 21.7 mmHg — ABNORMAL LOW (ref 32.0–48.0)
pH, Arterial: 7.372 (ref 7.350–7.450)
pO2, Arterial: 98.8 mmHg (ref 83.0–108.0)

## 2019-02-06 LAB — FIBRINOGEN: Fibrinogen: 390 mg/dL (ref 210–475)

## 2019-02-06 LAB — LACTIC ACID, PLASMA
Lactic Acid, Venous: 2 mmol/L (ref 0.5–1.9)
Lactic Acid, Venous: 1.2 mmol/L (ref 0.5–1.9)

## 2019-02-06 LAB — TRIGLYCERIDES: Triglycerides: 134 mg/dL (ref <150)

## 2019-02-06 LAB — C-REACTIVE PROTEIN: CRP: 5.7 mg/dL — ABNORMAL HIGH (ref <1.0)

## 2019-02-06 LAB — TROPONIN I (HIGH SENSITIVITY)
Troponin I (High Sensitivity): 27 ng/L — ABNORMAL HIGH (ref <18)
Troponin I (High Sensitivity): 27 ng/L — ABNORMAL HIGH (ref <18)

## 2019-02-06 LAB — FERRITIN: Ferritin: 255 ng/mL (ref 24–336)

## 2019-02-06 LAB — PROCALCITONIN: Procalcitonin: 0.16 ng/mL

## 2019-02-06 LAB — LACTATE DEHYDROGENASE: LDH: 356 U/L — ABNORMAL HIGH (ref 98–192)

## 2019-02-06 LAB — CBG MONITORING, ED: Glucose-Capillary: 184 mg/dL — ABNORMAL HIGH (ref 70–99)

## 2019-02-06 LAB — BRAIN NATRIURETIC PEPTIDE: B Natriuretic Peptide: 88 pg/mL (ref 0.0–100.0)

## 2019-02-06 MED ORDER — SODIUM CHLORIDE 0.9% FLUSH
3.0000 mL | Freq: Two times a day (BID) | INTRAVENOUS | Status: DC
  Administered 2019-02-07 – 2019-02-13 (×12): 3 mL via INTRAVENOUS

## 2019-02-06 MED ORDER — METOPROLOL TARTRATE 25 MG PO TABS
25.0000 mg | ORAL_TABLET | Freq: Two times a day (BID) | ORAL | Status: DC
  Administered 2019-02-06 – 2019-02-14 (×12): 25 mg via ORAL
  Filled 2019-02-06 (×14): qty 1

## 2019-02-06 MED ORDER — DEXAMETHASONE SODIUM PHOSPHATE 10 MG/ML IJ SOLN
6.0000 mg | Freq: Once | INTRAMUSCULAR | Status: AC
  Administered 2019-02-06: 6 mg via INTRAVENOUS
  Filled 2019-02-06: qty 1

## 2019-02-06 MED ORDER — SODIUM CHLORIDE 0.9% FLUSH: 3.0000 mL | INTRAVENOUS | Status: DC | PRN

## 2019-02-06 MED ORDER — DOXAZOSIN MESYLATE 4 MG PO TABS
4.0000 mg | ORAL_TABLET | Freq: Every day | ORAL | Status: DC
  Administered 2019-02-07 – 2019-02-14 (×7): 4 mg via ORAL
  Filled 2019-02-06 (×10): qty 1

## 2019-02-06 MED ORDER — SODIUM CHLORIDE 0.9 % IV SOLN
1.0000 g | INTRAVENOUS | Status: DC
  Administered 2019-02-06 – 2019-02-07 (×2): 1 g via INTRAVENOUS
  Filled 2019-02-06: qty 10
  Filled 2019-02-06: qty 1

## 2019-02-06 MED ORDER — METHYLPREDNISOLONE SODIUM SUCC 40 MG IJ SOLR
0.5000 mg/kg | Freq: Two times a day (BID) | INTRAMUSCULAR | Status: DC
  Administered 2019-02-07 – 2019-02-08 (×3): 36 mg via INTRAVENOUS
  Filled 2019-02-06 (×2): qty 1

## 2019-02-06 MED ORDER — ONDANSETRON HCL 4 MG PO TABS: 4.0000 mg | ORAL_TABLET | Freq: Four times a day (QID) | ORAL | Status: DC | PRN

## 2019-02-06 MED ORDER — PRAVASTATIN SODIUM 10 MG PO TABS
20.0000 mg | ORAL_TABLET | Freq: Every evening | ORAL | Status: DC
  Administered 2019-02-06 – 2019-02-13 (×8): 20 mg via ORAL
  Filled 2019-02-06: qty 2
  Filled 2019-02-06: qty 1
  Filled 2019-02-06: qty 2
  Filled 2019-02-06 (×2): qty 1
  Filled 2019-02-06: qty 2
  Filled 2019-02-06: qty 1
  Filled 2019-02-06 (×2): qty 2

## 2019-02-06 MED ORDER — INSULIN ASPART 100 UNIT/ML ~~LOC~~ SOLN
0.0000 [IU] | Freq: Every day | SUBCUTANEOUS | Status: DC
  Administered 2019-02-07 – 2019-02-12 (×2): 3 [IU] via SUBCUTANEOUS
  Filled 2019-02-06: qty 0.05

## 2019-02-06 MED ORDER — ENOXAPARIN SODIUM 40 MG/0.4ML ~~LOC~~ SOLN
40.0000 mg | Freq: Every day | SUBCUTANEOUS | Status: DC
  Administered 2019-02-06 – 2019-02-13 (×8): 40 mg via SUBCUTANEOUS
  Filled 2019-02-06 (×9): qty 0.4

## 2019-02-06 MED ORDER — ASPIRIN EC 81 MG PO TBEC
81.0000 mg | DELAYED_RELEASE_TABLET | Freq: Every day | ORAL | Status: DC
  Administered 2019-02-07 – 2019-02-14 (×8): 81 mg via ORAL
  Filled 2019-02-06 (×8): qty 1

## 2019-02-06 MED ORDER — SODIUM CHLORIDE 0.9 % IV SOLN
200.0000 mg | Freq: Once | INTRAVENOUS | Status: AC
  Administered 2019-02-06: 200 mg via INTRAVENOUS
  Filled 2019-02-06: qty 40

## 2019-02-06 MED ORDER — INSULIN ASPART 100 UNIT/ML ~~LOC~~ SOLN
0.0000 [IU] | Freq: Three times a day (TID) | SUBCUTANEOUS | Status: DC
  Administered 2019-02-07: 08:00:00 8 [IU] via SUBCUTANEOUS
  Administered 2019-02-08: 11 [IU] via SUBCUTANEOUS
  Administered 2019-02-08: 17:00:00 2 [IU] via SUBCUTANEOUS
  Administered 2019-02-08 – 2019-02-09 (×2): 5 [IU] via SUBCUTANEOUS
  Administered 2019-02-09: 13:00:00 11 [IU] via SUBCUTANEOUS
  Administered 2019-02-10 (×3): 3 [IU] via SUBCUTANEOUS
  Administered 2019-02-11: 8 [IU] via SUBCUTANEOUS
  Administered 2019-02-11 – 2019-02-12 (×2): 5 [IU] via SUBCUTANEOUS
  Administered 2019-02-12 – 2019-02-13 (×2): 8 [IU] via SUBCUTANEOUS
  Administered 2019-02-13: 17:00:00 20 [IU] via SUBCUTANEOUS
  Administered 2019-02-14: 8 [IU] via SUBCUTANEOUS
  Filled 2019-02-06: qty 0.15

## 2019-02-06 MED ORDER — OXYCODONE HCL 5 MG PO TABS: 5.0000 mg | ORAL_TABLET | Freq: Four times a day (QID) | ORAL | Status: DC | PRN

## 2019-02-06 MED ORDER — ALBUTEROL SULFATE HFA 108 (90 BASE) MCG/ACT IN AERS
1.0000 | INHALATION_SPRAY | RESPIRATORY_TRACT | Status: DC | PRN
  Administered 2019-02-12: 21:00:00 1 via RESPIRATORY_TRACT
  Filled 2019-02-06: qty 6.7

## 2019-02-06 MED ORDER — SODIUM CHLORIDE 0.9 % IV SOLN
100.0000 mg | INTRAVENOUS | Status: AC
  Administered 2019-02-07 – 2019-02-10 (×4): 100 mg via INTRAVENOUS
  Filled 2019-02-06 (×2): qty 20
  Filled 2019-02-06: qty 100
  Filled 2019-02-06: qty 20

## 2019-02-06 MED ORDER — METHYLPREDNISOLONE SODIUM SUCC 40 MG IJ SOLR
0.5000 mg/kg | Freq: Two times a day (BID) | INTRAMUSCULAR | Status: DC
  Filled 2019-02-06: qty 1

## 2019-02-06 MED ORDER — PANTOPRAZOLE SODIUM 40 MG PO TBEC
40.0000 mg | DELAYED_RELEASE_TABLET | Freq: Every day | ORAL | Status: DC
  Administered 2019-02-07 – 2019-02-14 (×8): 40 mg via ORAL
  Filled 2019-02-06 (×8): qty 1

## 2019-02-06 MED ORDER — BISACODYL 5 MG PO TBEC: 5.0000 mg | DELAYED_RELEASE_TABLET | Freq: Every day | ORAL | Status: DC | PRN

## 2019-02-06 MED ORDER — ONDANSETRON HCL 4 MG/2ML IJ SOLN: 4.0000 mg | Freq: Four times a day (QID) | INTRAMUSCULAR | Status: DC | PRN

## 2019-02-06 MED ORDER — SODIUM CHLORIDE 0.9% FLUSH
3.0000 mL | Freq: Two times a day (BID) | INTRAVENOUS | Status: DC
  Administered 2019-02-08 – 2019-02-13 (×5): 3 mL via INTRAVENOUS

## 2019-02-06 MED ORDER — DOCUSATE SODIUM 100 MG PO CAPS
100.0000 mg | ORAL_CAPSULE | Freq: Two times a day (BID) | ORAL | Status: DC
  Administered 2019-02-06 – 2019-02-14 (×16): 100 mg via ORAL
  Filled 2019-02-06 (×16): qty 1

## 2019-02-06 MED ORDER — ALBUTEROL SULFATE HFA 108 (90 BASE) MCG/ACT IN AERS: 2.0000 | INHALATION_SPRAY | RESPIRATORY_TRACT | Status: DC | PRN

## 2019-02-06 MED ORDER — ZOLPIDEM TARTRATE 5 MG PO TABS: 5.0000 mg | ORAL_TABLET | Freq: Every evening | ORAL | Status: DC | PRN

## 2019-02-06 MED ORDER — SODIUM CHLORIDE 0.9 % IV SOLN: 250.0000 mL | INTRAVENOUS | Status: DC | PRN

## 2019-02-06 MED ORDER — ACETAMINOPHEN 325 MG PO TABS: 650.0000 mg | ORAL_TABLET | Freq: Four times a day (QID) | ORAL | Status: DC | PRN

## 2019-02-06 MED ORDER — SODIUM CHLORIDE 0.9 % IV SOLN
500.0000 mg | INTRAVENOUS | Status: DC
  Administered 2019-02-06 – 2019-02-07 (×2): 500 mg via INTRAVENOUS
  Filled 2019-02-06 (×2): qty 500

## 2019-02-06 MED ORDER — POLYETHYLENE GLYCOL 3350 17 G PO PACK: 17.0000 g | PACK | Freq: Every day | ORAL | Status: DC | PRN

## 2019-02-06 MED ORDER — METHYLPREDNISOLONE SODIUM SUCC 40 MG IJ SOLR: 0.5000 mg/kg | Freq: Two times a day (BID) | INTRAMUSCULAR | Status: DC

## 2019-02-06 NOTE — ED Provider Notes (Signed)
[facility]PT [facility] Emergency Department Provider Note MRN:  [id number]  Arrival date & time: 02/07/19     Chief Complaint   Covid positive, Respiratory Distress, and Fever   History of Present Illness   Ryan Burke is a 78 y.o. year-old male with a history of hypertension, CKD presenting to the ED with chief complaint of respiratory distress.  Diagnosed with COVID-19 at the beginning of the month.  Today became worse, increase shortness of breath, fever.  History largely obtained from family as patient speaks Nepali only  Review of Systems  Positive for fever, shortness of breath.  Patient's Health History    Past Medical History:  Diagnosis Date  . Aortic regurgitation   . Asthma   . Chest pain at rest   . Chronic bronchitis (HCC)   . Chronic kidney disease   . Diabetes mellitus without complication (HCC)    "has had it before; he's ok now; not on RC anymore" (08/27/2016)  . GERD (gastroesophageal reflux disease)   . Gout   . Heart murmur   . Hyperlipidemia   . Hypertension   . Mitral regurgitation   . Seizures (HCC) 03/2013 X 1   "maybe because of a high fever"  . Shortness of breath    "w/activity" (08/27/2016)    Past Surgical History:  Procedure Laterality Date  . ABDOMINAL AORTAGRAM N/A 04/12/2012   Procedure: ABDOMINAL Ronny Flurry;  Surgeon: Pamella Pert, MD;  Location: [facility] CATH LAB;  Service: Cardiovascular;  Laterality: N/A;  . 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   Procedure: AORTIC VALVE REPLACEMENT (AVR);  Surgeon: [name], MD;  Location: [facility] OR;  Service: Open Heart Surgery;  Laterality: N/A;  . CARDIAC CATHETERIZATION    . CARDIAC VALVE REPLACEMENT    . CATARACT EXTRACTION W/ INTRAOCULAR LENS  IMPLANT, BILATERAL Bilateral   . INTRAOPERATIVE TRANSESOPHAGEAL ECHOCARDIOGRAM  05/04/2012   Procedure: INTRAOPERATIVE TRANSESOPHAGEAL ECHOCARDIOGRAM;  Surgeon: [name], MD;  Location: [facility] OR;  Service: Open Heart Surgery;   Laterality: N/A;  . LEFT AND RIGHT HEART CATHETERIZATION WITH CORONARY ANGIOGRAM N/A 04/12/2012   Procedure: LEFT AND RIGHT HEART CATHETERIZATION WITH CORONARY ANGIOGRAM;  Surgeon: Pamella Pert, MD;  Location: [facility] CATH LAB;  Service: Cardiovascular;  Laterality: N/A;    Family History  Problem Relation Age of Onset  . Heart disease Sister     Social History   Socioeconomic History  . Marital status: Married    Spouse name: Not on file  . Number of children: 6  . Years of education: Not on file  . Highest education level: Not on file  Occupational History  . Not on file  Social Needs  . Financial resource strain: Not on file  . Food insecurity    Worry: Not on file    Inability: Not on file  . Transportation needs    Medical: Not on file    Non-medical: Not on file  Tobacco Use  . Smoking status: Former Smoker    Packs/day: 0.50    Years: 4.00    Pack years: 2.00    Types: Cigarettes    Quit date: 03/30/2001    Years since quitting: 17.8  . Smokeless tobacco: Current User    Types: Chew  Substance and Sexual Activity  . Alcohol use: Yes    Alcohol/week: 1.0 standard drinks    Types: 1 Cans of beer per week  . Drug use: No  . Sexual activity: Not on file  Lifestyle  . Physical activity    Days per week: Not on file    Minutes per session: Not on file  . Stress: Not on file  Relationships  . Social Musician on phone: Not on file    Gets together: Not on file    Attends religious service: Not on file    Active member of club or organization: Not on file    Attends meetings of clubs or organizations: Not on file    Relationship status: Not on file  . Intimate partner violence    Fear of current or ex partner: Not on file    Emotionally abused: Not on file    Physically abused: Not on file    Forced sexual activity: Not on file  Other Topics Concern  . Not on file  Social History Narrative  . Not on file     Physical Exam  Vital Signs and  Nursing Notes reviewed Vitals:   02/07/19 1400 02/07/19 1430  BP: 121/72 119/74  Pulse: (!) 52 (!) 54  Resp: 16 15  Temp:    SpO2: 94% 93%    CONSTITUTIONAL: Tired-appearing, NAD NEURO:  Alert and oriented x 3, no focal deficits EYES:  eyes equal and reactive ENT/NECK:  no LAD, no JVD CARDIO: Regular rate, well-perfused, normal S1 and S2 PULM:  CTAB no wheezing or rhonchi, mildly tachypneic GI/GU:  normal bowel sounds, non-distended, non-tender MSK/SPINE:  No gross deformities, no edema SKIN:  no rash, atraumatic PSYCH:  Appropriate speech and behavior  Diagnostic and Interventional Summary    EKG Interpretation  Date/Time:  Monday February 06 2019 19:18:46 EST Ventricular Rate:  73 PR Interval:    QRS Duration: 108 QT Interval:  398 QTC Calculation: 439 R Axis:   43 Text Interpretation: Sinus rhythm Confirmed by Kennis Carina [phone]) on 02/06/2019 7:59:38 PM      Labs Reviewed  LACTIC ACID, PLASMA - Abnormal; Notable for the following components:      Result Value   Lactic Acid, Venous 2.0 (*)    All other components within normal limits  CBC WITH DIFFERENTIAL/PLATELET - Abnormal; Notable for the following components:   WBC 2.7 (*)    RBC 4.07 (*)    Hemoglobin 11.5 (*)    HCT 34.5 (*)    Platelets 92 (*)    Lymphs Abs 0.6 (*)    All other components within normal limits  COMPREHENSIVE METABOLIC PANEL - Abnormal; Notable for the following components:   Sodium 134 (*)    Chloride 115 (*)    CO2 13 (*)    Glucose, Bld 160 (*)    Creatinine, Ser 1.76 (*)    Calcium 7.2 (*)    Total Protein 5.9 (*)    Albumin 2.5 (*)    AST 63 (*)    Alkaline Phosphatase 161 (*)    Total Bilirubin 1.3 (*)    GFR calc non Af Amer 36 (*)    GFR calc Af Amer 42 (*)    All other components within normal limits  D-DIMER, QUANTITATIVE (NOT AT [facility]) - Abnormal; Notable for the following components:   D-Dimer, Quant 1.83 (*)    All other components within normal limits  LACTATE  DEHYDROGENASE - Abnormal; Notable for the following components:   LDH 356 (*)    All other components within normal limits  C-REACTIVE PROTEIN - Abnormal; Notable for the following components:   CRP 5.7 (*)    All  other components within normal limits  BLOOD GAS, ARTERIAL - Abnormal; Notable for the following components:   pCO2 arterial 21.7 (*)    Bicarbonate 12.3 (*)    Acid-base deficit 11.2 (*)    All other components within normal limits  CBC WITH DIFFERENTIAL/PLATELET - Abnormal; Notable for the following components:   WBC 2.1 (*)    Hemoglobin 12.1 (*)    HCT 37.3 (*)    Platelets 85 (*)    Neutro Abs 1.6 (*)    Lymphs Abs 0.5 (*)    All other components within normal limits  COMPREHENSIVE METABOLIC PANEL - Abnormal; Notable for the following components:   Potassium 5.7 (*)    Chloride 115 (*)    CO2 13 (*)    Glucose, Bld 272 (*)    Creatinine, Ser 1.74 (*)    Calcium 7.1 (*)    Total Protein 5.9 (*)    Albumin 2.4 (*)    AST 64 (*)    Alkaline Phosphatase 157 (*)    GFR calc non Af Amer 37 (*)    GFR calc Af Amer 43 (*)    All other components within normal limits  C-REACTIVE PROTEIN - Abnormal; Notable for the following components:   CRP 6.8 (*)    All other components within normal limits  D-DIMER, QUANTITATIVE (NOT AT [facility]) - Abnormal; Notable for the following components:   D-Dimer, Quant 1.73 (*)    All other components within normal limits  MAGNESIUM - Abnormal; Notable for the following components:   Magnesium 1.2 (*)    All other components within normal limits  HEMOGLOBIN A1C - Abnormal; Notable for the following components:   Hgb A1c MFr Bld 11.1 (*)    All other components within normal limits  CBG MONITORING, ED - Abnormal; Notable for the following components:   Glucose-Capillary 184 (*)    All other components within normal limits  CBG MONITORING, ED - Abnormal; Notable for the following components:   Glucose-Capillary 260 (*)    All other  components within normal limits  CBG MONITORING, ED - Abnormal; Notable for the following components:   Glucose-Capillary 243 (*)    All other components within normal limits  TROPONIN I (HIGH SENSITIVITY) - Abnormal; Notable for the following components:   Troponin I (High Sensitivity) 27 (*)    All other components within normal limits  TROPONIN I (HIGH SENSITIVITY) - Abnormal; Notable for the following components:   Troponin I (High Sensitivity) 27 (*)    All other components within normal limits  CULTURE, BLOOD (ROUTINE X 2)  CULTURE, BLOOD (ROUTINE X 2)  LACTIC ACID, PLASMA  PROCALCITONIN  FERRITIN  TRIGLYCERIDES  FIBRINOGEN  BRAIN NATRIURETIC PEPTIDE  FERRITIN  PHOSPHORUS  RENAL FUNCTION PANEL    DG Chest Port 1 View  Final Result      Medications  aspirin EC tablet 81 mg (81 mg Oral Given 02/07/19 0809)  oxyCODONE (Oxy IR/ROXICODONE) immediate release tablet 5 mg (has no administration in time range)  doxazosin (CARDURA) tablet 4 mg (4 mg Oral Given 02/07/19 0809)  metoprolol tartrate (LOPRESSOR) tablet 25 mg (25 mg Oral Given 02/07/19 0809)  pravastatin (PRAVACHOL) tablet 20 mg (20 mg Oral Given 02/06/19 2310)  pantoprazole (PROTONIX) EC tablet 40 mg (40 mg Oral Given 02/07/19 0809)  sodium chloride flush (NS) 0.9 % injection 3 mL (3 mLs Intravenous Not Given 02/07/19 1117)  sodium chloride flush (NS) 0.9 % injection 3 mL (has no administration in  time range)  0.9 %  sodium chloride infusion (has no administration in time range)  acetaminophen (TYLENOL) tablet 650 mg (has no administration in time range)  zolpidem (AMBIEN) tablet 5 mg (has no administration in time range)  docusate sodium (COLACE) capsule 100 mg (100 mg Oral Given 02/07/19 0809)  polyethylene glycol (MIRALAX / GLYCOLAX) packet 17 g (has no administration in time range)  bisacodyl (DULCOLAX) EC tablet 5 mg (has no administration in time range)  ondansetron (ZOFRAN) tablet 4 mg (has no administration in  time range)    Or  ondansetron (ZOFRAN) injection 4 mg (has no administration in time range)  insulin aspart (novoLOG) injection 0-15 Units (0 Units Subcutaneous Not Given 02/07/19 1143)  enoxaparin (LOVENOX) injection 40 mg (40 mg Subcutaneous Given 02/06/19 2311)  sodium chloride flush (NS) 0.9 % injection 3 mL (3 mLs Intravenous Not Given 02/07/19 0810)  insulin aspart (novoLOG) injection 0-5 Units (0 Units Subcutaneous Not Given 02/06/19 2319)  albuterol (VENTOLIN HFA) 108 (90 Base) MCG/ACT inhaler 1 puff (has no administration in time range)  remdesivir 200 mg in sodium chloride 0.9 % 250 mL IVPB (0 mg Intravenous Stopped 02/07/19 0010)    Followed by  remdesivir 100 mg in sodium chloride 0.9 % 250 mL IVPB (has no administration in time range)  cefTRIAXone (ROCEPHIN) 1 g in sodium chloride 0.9 % 100 mL IVPB (0 g Intravenous Stopped 02/06/19 2339)  azithromycin (ZITHROMAX) 500 mg in sodium chloride 0.9 % 250 mL IVPB (0 mg Intravenous Stopped 02/07/19 0011)  methylPREDNISolone sodium succinate (SOLU-MEDROL) 40 mg/mL injection 36 mg (36 mg Intravenous Given 02/07/19 0817)  dexamethasone (DECADRON) injection 6 mg (6 mg Intravenous Given 02/06/19 1915)  magnesium sulfate IVPB 2 g 50 mL (2 g Intravenous New Bag/Given 02/07/19 1118)     Procedures  /  Critical Care .Critical Care Performed by: Sabas SousBero, Natha Guin M, MD Authorized by: Sabas SousBero, Anjana Cheek M, MD   Critical care provider statement:    Critical care time (minutes):  45   Critical care was necessary to treat or prevent imminent or life-threatening deterioration of the following conditions: COVID-19, hypoxic respiratory failure.   Critical care was time spent personally by me on the following activities:  Discussions with consultants, evaluation of patient's response to treatment, examination of patient, ordering and performing treatments and interventions, ordering and review of laboratory studies, ordering and review of radiographic studies,  pulse oximetry, re-evaluation of patient's condition, obtaining history from patient or surrogate and review of old charts    ED Course and Medical Decision Making  I have reviewed the triage vital signs and the nursing notes.  Pertinent labs & imaging results that were available during my care of the patient were reviewed by me and considered in my medical decision making (see below for details).    COVID-19, test +7 days ago, here with hypoxic respiratory failure.  Currently seems to be doing well on 10 L high flow nasal cannula.  Discussed with intensivist on-call, would be appropriate to hold off on intubation.  Will consult hospitalist for admission.  Elmer SowMichael M. Pilar PlateBero, MD [facility] [email or web address]  Final Clinical Impressions(s) / ED Diagnoses     ICD-10-CM   1. COVID-19  U07.1   2. Acute respiratory failure with hypoxia (HCC)  J96.01     ED Discharge Orders    None       Discharge Instructions Discussed with and Provided to Patient:   Discharge Instructions  None       Sabas Sous, MD 02/07/19 1459

## 2019-02-06 NOTE — H&P (Signed)
History and Physical    Ryan Burke:[id number] DOB: 02/08/1941 DOA: 02/06/2019  PCP: Fleet Contras, MD Consultants:  None Patient coming from:  Home - lives with ; NOK: Son, Ryan Burke (son), [phone]  Chief Complaint:  Worsening COVID symptoms  HPI: Ryan Burke is a 78 y.o. male with medical history significant of seizures; HTN; HLD; DM; CAD s/p CABG; and CKD presenting with worsening COVID symptoms.  Patient is [place] and does not speak [place].  He does appear to   I spoke with his son.  COVID symptoms, in quarantine since positive test last Friday.  He started with diarrhea yesterday, 4 stools yesterday and 6 times today.  + SOB yesterday and today.  +cough.  +fever.  Uncertain where he was exposed.    ED Course:  COVID on face mask O2 on 10L and maintaining sats for now.  Needs ICU placement for now.  Review of Systems: Unable to perform due to language barrier   Past Medical History:  Diagnosis Date  . Aortic regurgitation   . Asthma   . Chest pain at rest   . Chronic bronchitis (HCC)   . Chronic kidney disease   . Diabetes mellitus without complication (HCC)    "has had it before; he's ok now; not on RC anymore" (08/27/2016)  . GERD (gastroesophageal reflux disease)   . Gout   . Heart murmur   . Hyperlipidemia   . Hypertension   . Mitral regurgitation   . Seizures (HCC) 03/2013 X 1   "maybe because of a high fever"  . Shortness of breath    "w/activity" (08/27/2016)    Past Surgical History:  Procedure Laterality Date  . ABDOMINAL AORTAGRAM N/A 04/12/2012   Procedure: ABDOMINAL Ronny Flurry;  Surgeon: Pamella Pert, MD;  Location: [facility] CATH LAB;  Service: Cardiovascular;  Laterality: N/A;  . 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   Procedure: AORTIC VALVE REPLACEMENT (AVR);  Surgeon: [name], MD;  Location: [facility] OR;  Service: Open Heart Surgery;  Laterality: N/A;  . CARDIAC CATHETERIZATION    . CARDIAC VALVE REPLACEMENT    . CATARACT EXTRACTION W/  INTRAOCULAR LENS  IMPLANT, BILATERAL Bilateral   . INTRAOPERATIVE TRANSESOPHAGEAL ECHOCARDIOGRAM  05/04/2012   Procedure: INTRAOPERATIVE TRANSESOPHAGEAL ECHOCARDIOGRAM;  Surgeon: [name], MD;  Location: [facility] OR;  Service: Open Heart Surgery;  Laterality: N/A;  . LEFT AND RIGHT HEART CATHETERIZATION WITH CORONARY ANGIOGRAM N/A 04/12/2012   Procedure: LEFT AND RIGHT HEART CATHETERIZATION WITH CORONARY ANGIOGRAM;  Surgeon: Pamella Pert, MD;  Location[name] C[facility] LAB;  Service: Cardiovascular;  Laterality: N/A;    Social History   Socioeconomic History  . Marital status: Married    Spouse name: Not on file  . Number of children: 6  . Years of education: Not on file  . Highest education level: Not on file  Occupational History  . Not on file  Social Needs  . Financial resource strain: Not on file  . Food insecurity    Worry: Not on file    Inability: Not on file  . Transportation needs    Medical: Not on file    Non-medical: Not on file  Tobacco Use  . Smoking status: Former Smoker    Packs/day: 0.50    Years: 4.00    Pack years: 2.00    Types: Cigarettes    Quit date: 03/30/2001    Years since quitting: 17.8  . Smokeless tobacco: Current User    Types: Chew  Substance and Sexual Activity  . Alcohol use: Yes    Alcohol/week: 1.0 standard drinks    Types: 1 Cans of beer per week  . Drug use: No  . Sexual activity: Not on file  Lifestyle  . Physical activity    Days per week: Not on file    Minutes per session: Not on file  . Stress: Not on file  Relationships  . Social Musician on phone: Not on file    Gets together: Not on file    Attends religious service: Not on file    Active member of club or organization: Not on file    Attends meetings of clubs or organizations: Not on file    Relationship status: Not on file  . Intimate partner violence    Fear of current or ex partner: Not on file    Emotionally abused: Not on file    Physically abused: Not on  file    Forced sexual activity: Not on file  Other Topics Concern  . Not on file  Social History Narrative  . Not on file    No Known Allergies  Family History  Problem Relation Age of Onset  . Heart disease Sister     Prior to Admission medications   Medication Sig Start Date End Date Taking? Authorizing Provider  albuterol (PROVENTIL HFA;VENTOLIN HFA) 108 (90 BASE) MCG/ACT inhaler Inhale 2 puffs into the lungs every 6 (six) hours as needed. For shortness of breath   Yes [provider]  aspirin EC 81 MG tablet Take 81 mg by mouth daily.   Yes [provider]  cetirizine (ZYRTEC) 10 MG tablet Take 10 mg by mouth daily as needed. 12/29/18  Yes [provider]  colchicine 0.6 MG tablet Take 0.6 mg by mouth 2 (two) times daily as needed (gout).   Yes [provider]  doxazosin (CARDURA) 4 MG tablet Take 4 mg by mouth daily.   Yes [provider]  FARXIGA 5 MG TABS tablet Take 5 mg by mouth daily. 12/22/18  Yes [provider]  JANUVIA 50 MG tablet Take 50 mg by mouth daily. 02/01/19  Yes [provider]  metoprolol tartrate (LOPRESSOR) 25 MG tablet Take 1 tablet (25 mg total) by mouth 2 (two) times daily. 05/08/12  Yes Barrett, Erin R, PA-C  omeprazole (PRILOSEC) 20 MG capsule Take 20 mg by mouth 2 (two) times daily.    Yes [provider]  oxyCODONE (ROXICODONE) 5 MG immediate release tablet Take 1 tablet (5 mg total) by mouth every 6 (six) hours as needed for severe pain. 04/17/13  Yes Plunkett, Alphonzo Lemmings, MD  pravastatin (PRAVACHOL) 20 MG tablet Take 20 mg by mouth every evening.   Yes [provider]  azithromycin (ZITHROMAX Z-PAK) 250 MG tablet Take 1 tablet (250 mg total) by mouth daily. Patient not taking: Reported on 02/06/2019 08/28/16   Tyrone Nine, MD    Physical Exam: Vitals:   02/06/19 1914 02/06/19 1930 02/06/19 2030 02/06/19 2130  BP:  118/76 111/68 114/66  Pulse:  67 63 64  Resp:  (!) 28 (!) 24  16  Temp: (!) 100.7 F (38.2 C)     TempSrc: Rectal     SpO2:  96% 98% 99%  Weight:         . General:  Appears calm and comfortable and is NAD . Eyes:  PERRL, EOMI, normal lids, iris . ENT:  grossly normal hearing, lips &  tongue, mmm . Neck:  no LAD, masses or thyromegaly . Cardiovascular:  RRR, no m/r/g. No LE edema.  Marland Kitchen. Respiratory:   Diffuse right-sided rhonchi.  Mildly increased respiratory effort on [place] O2. . Abdomen:  soft, mildly diffusely TTP, ND, NABS . Skin:  no rash or induration seen on limited exam . Musculoskeletal:  grossly normal tone BUE/BLE, good ROM, no bony abnormality . Psychiatric: blunted mood and affect, speech fluent and appropriate, AOx3 . Neurologic:  CN 2-12 grossly intact, moves all extremities in coordinated fashion, sensation intact    Radiological Exams on Admission: Dg Chest Port 1 View  Result Date: 02/06/2019 CLINICAL DATA:  Diarrhea and short of breath EXAM: PORTABLE CHEST 1 VIEW COMPARISON:  08/28/2016 FINDINGS: Post sternotomy changes. Diffuse bilateral reticular opacity, suspicious for underlying chronic interstitial lung disease. More confluent ground-glass opacity at the bases and periphery of the right lung. Mild cardiomegaly. No pneumothorax. IMPRESSION: Suspected underlying component of chronic interstitial lung disease. Right greater than left ground-glass opacities and peripheral consolidations, suspect for pneumonia, to include atypical or viral pneumonia. Electronically Signed   By: Jasmine PangKim  Fujinaga M.D.   On: 02/06/2019 19:50    EKG: Independently reviewed.  NSR with rate 73; nonspecific ST changes with no evidence of acute ischemia   Labs on Admission: I have personally reviewed the available labs and imaging studies at the time of the admission.  Pertinent labs:   Glucose 160 BUN 17/Creatinine 1.76/GFR 36 - stable AP 161 Albumin 2.5 LDH 356 HS troponin 27, 27 Ferritin 255 CRP 5.7 Lactate 2.0, 1.2 Procalcitonin 0.16 WBC 2.7,  lymphocytes 0.6 Hgb 11.5 Platelets 92 D-dimer 1.83 Fibrinogen 390 COVID POSITIVE on 11/2   Assessment/Plan Principal Problem:   Acute respiratory disease due to COVID-19 virus Active Problems:   Essential hypertension, benign   Hyperlipidemia   S/P AVR (aortic valve replacement)   Atrial fibrillation (HCC)   Seizures (HCC)   Acute respiratory failure with hypoxia -Patient with presenting with known COVID, worsening diarrhea and SOB -He does not have a usual home O2 requirement and is currently requiring 10L [place] O2 -COVID POSITIVE -The patient has comorbidities which may increase the risk for ARDS/MODS including: age, HTN, DM -Exam is concerning for development of ARDS/MODS due to respiratory distress -Pertinent labs concerning for COVID include leukopenia/lymphopenia; increased BUN/Creatinine; increased LFTs; markedly elevated D-dimer (>1); elevated CRP (but not >7); mildly elevated troponin (likely demand ischemia); increased LDH -CXR with multifocal opacities which may be c/w COVID vs. Multifocal PNA -Will treat with broad-spectrum antibiotics given procalcitonin >0.1.   -Will not treat with broad-spectrum antibiotics given procalcitonin <0.1 -[facility] for further evaluation, close monitoring, and treatment -Monitor on telemetry x at least 24 hours -At this time, will attempt to avoid use of aerosolized medications and use HFAs instead -Will check daily labs including BMP with Mag, Phos; LFTs; CBC with differential; CRP (q12h); ferritin; fibrinogen; D-dimer (q12h) -Consider Echo (particularly if troponin is significantly elevated)  -Will order steroids (1 mg/kg divided BID) and Remdesivir (pharmacy consult) given +COVID test, +CXR, and hypoxia <94% on room air -If the patient shows clinical deterioration, consider transfer to ICU with PCCM consultation -If the patient is hypoxic or on >3L oxygen from baseline or CRP >7, considerTocilizumab 8 mg/kg x1 IF + COVID test; O2  sats <88% on room air; and patient is at high risk for intubation.  [facility] doctors regarding this medication since it is being used off label. -Will attempt to  maintain euvolemia to a net negative fluid status; despite diarrhea at home, patient does not appear to be volume deficient at this time and so will not give IVF. -Will ask the patient to maintain an awake prone position for 16+ hours a day, if possible, with a minimum of 2-3 hours at a time -With D-dimer <5, will use standard-dosed Lovenox for DVT prevention -Patient was seen wearing full PPE including: gown, gloves, head cover, N95, and face shield; donning and doffing was in compliance with current standards. -Patient with d/w PCCM by EDP and admission by their team was deferred due to patient's improved respiratory status.  HTN -Continue Lopressor -Also on Cardura, continued  CAD s/p CABG, AVR -Continue ASA, Lopressor, statin -Reported h/o afib, but not in afib at this time and rate controlled -Not on AC -Flat troponin that is minimally elevated, likely demand ischemia  HLD -Continue Pravachol  DM -Check A1c -Hold Farxiga, Januvia -Cover with moderate-scale SSI  Stage 3b CKD -Appears to be stable at this time -Will follow   DVT prophylaxis:  Lovenox  Code Status:  Full  Family Communication: None present; I spoke with the patient's son and daughter-in-law by telephone. Disposition Plan:  Home once clinically improved Consults called: PCCM by telephone only Admission status: Admit - It is my clinical opinion that admission to INPATIENT is reasonable and necessary because of the expectation that this patient will require hospital care that crosses at least 2 midnights to treat this condition based on the medical complexity of the problems presented.  Given the aforementioned information, the predictability of an adverse outcome is felt to be significant.      Karmen Bongo MD [facility]   How  to contact the [facility] Attending or Consulting provider [place] or covering provider during after hours [facility], for this patient?  1. Check the care team in [facility] and look for a) attending/consulting [facility] provider listed and b) the [facility] team listed 2. Log into [email or web address] and use [place]'s universal password to access. If you do not have the password, please contact the hospital operator. 3. Locate the [facility] provider you are looking for under [facility] and page to a number that you can be directly reached. 4. If you still have difficulty reaching the provider, please page the [facility] (Director on Call) for the Hospitalists listed on amion for assistance.   02/06/2019, 10:24 PM

## 2019-02-06 NOTE — ED Triage Notes (Signed)
Patient BIB EMS from home with complaints of diarrhea, increased SOB, respiratory distress, and fever. Patient on [facility] for EMS- sats 91%. Patient tachypnic at 40/min. Temp 103.5 axillary for EMS. 1gm PO tylenol and 1.5 liters NS administered by EMS at 1736. Patient is known Covid-19 Positive. Patient placed on 10L [place] by RT upon arrival to [facility]. Pt has hx of Dm- CBG = 136 for EMS. EKG WNL for EMS.  18g Left AC PIV, 22g Right hand PIV- both placed by EMS.   Patient primarily speaks [place]. Family available via telephone.  Merwyn Hodapp (son) - [phone]

## 2019-02-06 NOTE — Progress Notes (Signed)
Pharmacy: Remdesivir   Patient is a 78 y.o.M with COVID.  Pharmacy has been consulted for remdesivir dosing.   - ALT: 27 - CXR shows: R>L PNA - Pt is requiring supplemental oxygen: HLNC @ 10    A/P:  - Patient meets criteria for remdesivir. Will initiate remdesivir 200 mg once followed by 100 mg daily x 4 days.  - Daily CMET while on remdesivir - Will f/u pt's ALT and clinical condition  Netta Cedars, PharmD, BCPS 02/06/2019@10 :13 PM

## 2019-02-06 NOTE — Progress Notes (Signed)
ABG obtained and sent to lab; lab aware of incoming sample.

## 2019-02-07 ENCOUNTER — Encounter (HOSPITAL_COMMUNITY): Payer: Self-pay

## 2019-02-07 DIAGNOSIS — Z8615 Personal history of latent tuberculosis infection: Secondary | ICD-10-CM | POA: Diagnosis not present

## 2019-02-07 DIAGNOSIS — J42 Unspecified chronic bronchitis: Secondary | ICD-10-CM | POA: Diagnosis not present

## 2019-02-07 DIAGNOSIS — U071 COVID-19: Secondary | ICD-10-CM | POA: Diagnosis not present

## 2019-02-07 DIAGNOSIS — Z7982 Long term (current) use of aspirin: Secondary | ICD-10-CM | POA: Diagnosis not present

## 2019-02-07 DIAGNOSIS — Z8249 Family history of ischemic heart disease and other diseases of the circulatory system: Secondary | ICD-10-CM | POA: Diagnosis not present

## 2019-02-07 DIAGNOSIS — Z87891 Personal history of nicotine dependence: Secondary | ICD-10-CM | POA: Diagnosis not present

## 2019-02-07 DIAGNOSIS — J069 Acute upper respiratory infection, unspecified: Secondary | ICD-10-CM | POA: Diagnosis not present

## 2019-02-07 DIAGNOSIS — I4891 Unspecified atrial fibrillation: Secondary | ICD-10-CM | POA: Diagnosis not present

## 2019-02-07 DIAGNOSIS — K219 Gastro-esophageal reflux disease without esophagitis: Secondary | ICD-10-CM | POA: Diagnosis not present

## 2019-02-07 DIAGNOSIS — I2510 Atherosclerotic heart disease of native coronary artery without angina pectoris: Secondary | ICD-10-CM | POA: Diagnosis not present

## 2019-02-07 DIAGNOSIS — N1832 Chronic kidney disease, stage 3b: Secondary | ICD-10-CM | POA: Diagnosis not present

## 2019-02-07 DIAGNOSIS — Z953 Presence of xenogenic heart valve: Secondary | ICD-10-CM | POA: Diagnosis not present

## 2019-02-07 DIAGNOSIS — E1122 Type 2 diabetes mellitus with diabetic chronic kidney disease: Secondary | ICD-10-CM | POA: Diagnosis not present

## 2019-02-07 DIAGNOSIS — J1289 Other viral pneumonia: Secondary | ICD-10-CM | POA: Diagnosis not present

## 2019-02-07 DIAGNOSIS — J9601 Acute respiratory failure with hypoxia: Secondary | ICD-10-CM | POA: Diagnosis present

## 2019-02-07 DIAGNOSIS — M109 Gout, unspecified: Secondary | ICD-10-CM | POA: Diagnosis not present

## 2019-02-07 DIAGNOSIS — E785 Hyperlipidemia, unspecified: Secondary | ICD-10-CM | POA: Diagnosis not present

## 2019-02-07 DIAGNOSIS — Z7984 Long term (current) use of oral hypoglycemic drugs: Secondary | ICD-10-CM | POA: Diagnosis not present

## 2019-02-07 DIAGNOSIS — I129 Hypertensive chronic kidney disease with stage 1 through stage 4 chronic kidney disease, or unspecified chronic kidney disease: Secondary | ICD-10-CM | POA: Diagnosis not present

## 2019-02-07 DIAGNOSIS — E1165 Type 2 diabetes mellitus with hyperglycemia: Secondary | ICD-10-CM | POA: Diagnosis not present

## 2019-02-07 DIAGNOSIS — E872 Acidosis: Secondary | ICD-10-CM | POA: Diagnosis not present

## 2019-02-07 DIAGNOSIS — Z951 Presence of aortocoronary bypass graft: Secondary | ICD-10-CM | POA: Diagnosis not present

## 2019-02-07 DIAGNOSIS — Z79899 Other long term (current) drug therapy: Secondary | ICD-10-CM | POA: Diagnosis not present

## 2019-02-07 LAB — COMPREHENSIVE METABOLIC PANEL
ALT: 26 U/L (ref 0–44)
AST: 64 U/L — ABNORMAL HIGH (ref 15–41)
Albumin: 2.4 g/dL — ABNORMAL LOW (ref 3.5–5.0)
Alkaline Phosphatase: 157 U/L — ABNORMAL HIGH (ref 38–126)
Anion gap: 8 (ref 5–15)
BUN: 21 mg/dL (ref 8–23)
CO2: 13 mmol/L — ABNORMAL LOW (ref 22–32)
Calcium: 7.1 mg/dL — ABNORMAL LOW (ref 8.9–10.3)
Chloride: 115 mmol/L — ABNORMAL HIGH (ref 98–111)
Creatinine, Ser: 1.74 mg/dL — ABNORMAL HIGH (ref 0.61–1.24)
GFR calc Af Amer: 43 mL/min — ABNORMAL LOW (ref >60)
GFR calc non Af Amer: 37 mL/min — ABNORMAL LOW (ref >60)
Glucose, Bld: 272 mg/dL — ABNORMAL HIGH (ref 70–99)
Potassium: 5.7 mmol/L — ABNORMAL HIGH (ref 3.5–5.1)
Sodium: 136 mmol/L (ref 135–145)
Total Bilirubin: 1.2 mg/dL (ref 0.3–1.2)
Total Protein: 5.9 g/dL — ABNORMAL LOW (ref 6.5–8.1)

## 2019-02-07 LAB — CBC WITH DIFFERENTIAL/PLATELET
Abs Immature Granulocytes: 0.01 10*3/uL (ref 0.00–0.07)
Basophils Absolute: 0 10*3/uL (ref 0.0–0.1)
Basophils Relative: 0 %
Eosinophils Absolute: 0 10*3/uL (ref 0.0–0.5)
Eosinophils Relative: 1 %
HCT: 37.3 % — ABNORMAL LOW (ref 39.0–52.0)
Hemoglobin: 12.1 g/dL — ABNORMAL LOW (ref 13.0–17.0)
Immature Granulocytes: 1 %
Lymphocytes Relative: 23 %
Lymphs Abs: 0.5 10*3/uL — ABNORMAL LOW (ref 0.7–4.0)
MCH: 27.9 pg (ref 26.0–34.0)
MCHC: 32.4 g/dL (ref 30.0–36.0)
MCV: 86.1 fL (ref 80.0–100.0)
Monocytes Absolute: 0.1 10*3/uL (ref 0.1–1.0)
Monocytes Relative: 2 %
Neutro Abs: 1.6 10*3/uL — ABNORMAL LOW (ref 1.7–7.7)
Neutrophils Relative %: 73 %
Platelets: 85 10*3/uL — ABNORMAL LOW (ref 150–400)
RBC: 4.33 MIL/uL (ref 4.22–5.81)
RDW: 14.5 % (ref 11.5–15.5)
WBC: 2.1 10*3/uL — ABNORMAL LOW (ref 4.0–10.5)
nRBC: 0 % (ref 0.0–0.2)

## 2019-02-07 LAB — CBG MONITORING, ED
Glucose-Capillary: 260 mg/dL — ABNORMAL HIGH (ref 70–99)
Glucose-Capillary: 243 mg/dL — ABNORMAL HIGH (ref 70–99)

## 2019-02-07 LAB — HEMOGLOBIN A1C
Hgb A1c MFr Bld: 11.1 % — ABNORMAL HIGH (ref 4.8–5.6)
Mean Plasma Glucose: 271.87 mg/dL

## 2019-02-07 LAB — C-REACTIVE PROTEIN: CRP: 6.8 mg/dL — ABNORMAL HIGH (ref <1.0)

## 2019-02-07 LAB — PHOSPHORUS: Phosphorus: 2.8 mg/dL (ref 2.5–4.6)

## 2019-02-07 LAB — FERRITIN: Ferritin: 274 ng/mL (ref 24–336)

## 2019-02-07 LAB — D-DIMER, QUANTITATIVE: D-Dimer, Quant: 1.73 ug/mL-FEU — ABNORMAL HIGH (ref 0.00–0.50)

## 2019-02-07 LAB — GLUCOSE, CAPILLARY: Glucose-Capillary: 284 mg/dL — ABNORMAL HIGH (ref 70–99)

## 2019-02-07 LAB — MAGNESIUM: Magnesium: 1.2 mg/dL — ABNORMAL LOW (ref 1.7–2.4)

## 2019-02-07 MED ORDER — CHLORHEXIDINE GLUCONATE CLOTH 2 % EX PADS
6.0000 | MEDICATED_PAD | Freq: Every day | CUTANEOUS | Status: DC
  Administered 2019-02-07 – 2019-02-13 (×5): 6 via TOPICAL

## 2019-02-07 MED ORDER — MAGNESIUM SULFATE 2 GM/50ML IV SOLN
2.0000 g | Freq: Once | INTRAVENOUS | Status: AC
  Administered 2019-02-07: 2 g via INTRAVENOUS
  Filled 2019-02-07: qty 50

## 2019-02-07 NOTE — ED Notes (Signed)
Spoke with Marden Noble from [facility] who mentioned that it would be after 6 pm before the patient can be transported. Informed staff that patient has been on the NRB for over an hour per request.

## 2019-02-07 NOTE — Progress Notes (Signed)
PROGRESS NOTE    Ryan Burke  ZOX:[id number] DOB: 02/23/1941 DOA: 02/06/2019 PCP: Fleet ContrasAvbuere, Edwin, MD  Outpatient Specialists:   Brief Narrative:  Patient is a 78 year old [place] male, non-English-speaking, with past medical history significant for seizures, hypertension, hyperlipidemia, diabetes mellitus, coronary artery disease status post CABG and chronic kidney disease.  Patient was admitted with worsening symptoms of Covid.  Apparently, patient tested positive for Covid about 4 days ago.  Patient has developed diarrhea, shortness of breath, fever and cough necessitating inpatient care.  [facility] for further assessment and management.  Assessment & Plan:   Principal Problem:   Acute respiratory disease due to COVID-19 virus Active Problems:   Essential hypertension, benign   Hyperlipidemia   S/P AVR (aortic valve replacement)   Atrial fibrillation (HCC)   Seizures (HCC)  COVID-19 pneumonia/acute respiratory failure with hypoxia -Admitted with worsening COVID-19 symptoms.   -Worsening shortness of breath, fever and diarrhea reported.  -Patient is currently on 8 to 9 L of supplemental oxygen.   -The patient has comorbidities which may increase the risk for ARDS/MODS including: age, HTN, DM -Continue steroids, remdesivir and antibiotics. -Transfer patient to [facility] for further assessment and management. -Guarded prognosis.  Hypertension:  -Continue Lopressor -Controlled.  Continue to monitor closely. -Systolic blood pressure is 180 mmHg.  CAD s/p CABG, AVR -Stable -Flat troponin that is minimally elevated, likely demand ischemia  HLD -Continue Pravachol  DM -HbA1c was 11.1%.   -Continue sliding scale insulin coverage.   Stage 3b CKD -Stable (at baseline)  DVT prophylaxis: Subcutaneous Lovenox Code Status: Full code Family Communication:  Disposition Plan: [facility]    Consultants:   None  Procedures:   None  Antimicrobials:   IV azithromycin  IV Rocephin.  IV remdesivir.   Subjective: No new complaints. Fever continues  Objective: Vitals:   02/07/19 1114 02/07/19 1130 02/07/19 1200 02/07/19 1230  BP: 104/62 116/62 117/70 119/71  Pulse: (!) 59 (!) 53 (!) 56 64  Resp: 18 16 20 19   Temp:      TempSrc:      SpO2: 91% (!) 89% 92% (!) 88%  Weight:        Intake/Output Summary (Last 24 hours) at 02/07/2019 1352 Last data filed at 02/07/2019 0011 Gross per 24 hour  Intake 600 ml  Output -  Net 600 ml   Filed Weights   02/06/19 1848  Weight: 71.8 kg    Examination:  General exam: Appears calm and comfortable  Respiratory system: Decreased air entry Cardiovascular system: S1 & S2 heard Gastrointestinal system: Abdomen is nondistended, soft and nontender. No organomegaly or masses felt. Normal bowel sounds heard. Central nervous system: Awake and alert.  Patient moves all extremities.     Data Reviewed: I have personally reviewed following labs and imaging studies  CBC: Recent Labs  Lab 02/06/19 1911 02/07/19 0408  WBC 2.7* 2.1*  NEUTROABS 1.9 1.6*  HGB 11.5* 12.1*  HCT 34.5* 37.3*  MCV 84.8 86.1  PLT 92* 85*   Basic Metabolic Panel: Recent Labs  Lab 02/06/19 1911 02/07/19 0408  NA 134* 136  K 3.8 5.7*  CL 115* 115*  CO2 13* 13*  GLUCOSE 160* 272*  BUN 17 21  CREATININE 1.76* 1.74*  CALCIUM 7.2* 7.1*  MG  --  1.2*  PHOS  --  2.8   GFR: CrCl cannot be calculated (Unknown ideal weight.). Liver Function Tests: Recent Labs  Lab  02/06/19 1911 02/07/19 0408  AST 63* 64*  ALT 27 26  ALKPHOS 161* 157*  BILITOT 1.3* 1.2  PROT 5.9* 5.9*  ALBUMIN 2.5* 2.4*   No results for input(s): LIPASE, AMYLASE in the last 168 hours. No results for input(s): AMMONIA in the last 168 hours. Coagulation Profile: No results for input(s): INR, PROTIME in the last 168 hours. Cardiac Enzymes: No results for input(s):  CKTOTAL, CKMB, CKMBINDEX, TROPONINI in the last 168 hours. BNP (last 3 results) No results for input(s): PROBNP in the last 8760 hours. HbA1C: Recent Labs    02/06/19 2253  HGBA1C 11.1*   CBG: Recent Labs  Lab 02/06/19 2315 02/07/19 0630 02/07/19 1138  GLUCAP 184* 260* 243*   Lipid Profile: Recent Labs    02/06/19 1911  TRIG 134   Thyroid Function Tests: No results for input(s): TSH, T4TOTAL, FREET4, T3FREE, THYROIDAB in the last 72 hours. Anemia Panel: Recent Labs    02/06/19 1911 02/07/19 0408  FERRITIN 255 274   Urine analysis:    Component Value Date/Time   COLORURINE AMBER (A) 08/27/2016 1654   APPEARANCEUR HAZY (A) 08/27/2016 1654   LABSPEC 1.025 08/27/2016 1654   PHURINE 5.0 08/27/2016 1654   GLUCOSEU NEGATIVE 08/27/2016 1654   GLUCOSEU NEG mg/dL 40/98/1191 4782   HGBUR NEGATIVE 08/27/2016 1654   HGBUR negative 09/20/2009 1029   BILIRUBINUR NEGATIVE 08/27/2016 1654   KETONESUR NEGATIVE 08/27/2016 1654   PROTEINUR 30 (A) 08/27/2016 1654   UROBILINOGEN 0.2 04/19/2013 0355   NITRITE NEGATIVE 08/27/2016 1654   LEUKOCYTESUR NEGATIVE 08/27/2016 1654   Sepsis Labs: @LABRCNTIP (procalcitonin:4,lacticidven:4)  ) Recent Results (from the past 240 hour(s))  Novel Coronavirus, NAA ([facility])     Status: Abnormal   Collection Time: 01/30/19 12:00 AM   Specimen: Nasopharyngeal(NP) swabs in vial transport medium   NASOPHARYNGE  TESTING  Result Value Ref Range Status   SARS-CoV-2, NAA Detected (A) Not Detected Final    Comment: This nucleic acid amplification test was developed and its performance characteristics determined by 13/02/20. Nucleic acid amplification tests include PCR and TMA. This test has not been FDA cleared or approved. This test has been authorized by FDA under an Emergency Use Authorization (EUA). This test is only authorized for the duration of time the declaration that circumstances exist justifying the authorization of the  emergency use of in vitro diagnostic tests for detection of SARS-CoV-2 virus and/or diagnosis of COVID-19 infection under section 564(b)(1) of the Act, 21 U.S.C. [employer]) (1), unless the authorization is terminated or revoked sooner. When diagnostic testing is negative, the possibility of a false negative result should be considered in the context of a patient's recent exposures and the presence of clinical signs and symptoms consistent with COVID-19. An individual without symptoms of COVID-19 and who is not shedding SARS-CoV-2 virus would  expect to have a negative (not detected) result in this assay.          Radiology Studies: Dg Chest Port 1 View  Result Date: 02/06/2019 CLINICAL DATA:  Diarrhea and short of breath EXAM: PORTABLE CHEST 1 VIEW COMPARISON:  08/28/2016 FINDINGS: Post sternotomy changes. Diffuse bilateral reticular opacity, suspicious for underlying chronic interstitial lung disease. More confluent ground-glass opacity at the bases and periphery of the right lung. Mild cardiomegaly. No pneumothorax. IMPRESSION: Suspected underlying component of chronic interstitial lung disease. Right greater than left ground-glass opacities and peripheral consolidations, suspect for pneumonia, to include atypical or viral pneumonia. Electronically Signed   By: 10/28/2016.D.  On: 02/06/2019 19:50        Scheduled Meds: . aspirin EC  81 mg Oral Daily  . docusate sodium  100 mg Oral BID  . doxazosin  4 mg Oral Daily  . enoxaparin (LOVENOX) injection  40 mg Subcutaneous QHS  . insulin aspart  0-15 Units Subcutaneous TID WC  . insulin aspart  0-5 Units Subcutaneous QHS  . methylPREDNISolone (SOLU-MEDROL) injection  0.5 mg/kg Intravenous BID  . metoprolol tartrate  25 mg Oral BID  . pantoprazole  40 mg Oral Daily  . pravastatin  20 mg Oral QPM  . sodium chloride flush  3 mL Intravenous Q12H  . sodium chloride flush  3 mL Intravenous Q12H   Continuous Infusions: .  sodium chloride    . azithromycin Stopped (02/07/19 0011)  . cefTRIAXone (ROCEPHIN)  IV Stopped (02/06/19 2339)  . remdesivir 100 mg in NS 250 mL       LOS: 1 day    Time spent: 35 minutes    Dana Allan, MD  [facility] Pager #: [phone] 7PM-7AM contact night coverage as above

## 2019-02-07 NOTE — ED Notes (Signed)
Patient transitioned to NRB per [facility] request for transport.

## 2019-02-07 NOTE — ED Notes (Signed)
Validated bad data at 0630.

## 2019-02-08 DIAGNOSIS — Z952 Presence of prosthetic heart valve: Secondary | ICD-10-CM

## 2019-02-08 DIAGNOSIS — I10 Essential (primary) hypertension: Secondary | ICD-10-CM | POA: Diagnosis not present

## 2019-02-08 DIAGNOSIS — E785 Hyperlipidemia, unspecified: Secondary | ICD-10-CM

## 2019-02-08 DIAGNOSIS — J9601 Acute respiratory failure with hypoxia: Secondary | ICD-10-CM | POA: Diagnosis not present

## 2019-02-08 DIAGNOSIS — U071 COVID-19: Secondary | ICD-10-CM | POA: Diagnosis not present

## 2019-02-08 LAB — COMPREHENSIVE METABOLIC PANEL
ALT: 26 U/L (ref 0–44)
AST: 49 U/L — ABNORMAL HIGH (ref 15–41)
Albumin: 2.7 g/dL — ABNORMAL LOW (ref 3.5–5.0)
Alkaline Phosphatase: 169 U/L — ABNORMAL HIGH (ref 38–126)
Anion gap: 10 (ref 5–15)
BUN: 34 mg/dL — ABNORMAL HIGH (ref 8–23)
CO2: 14 mmol/L — ABNORMAL LOW (ref 22–32)
Calcium: 8 mg/dL — ABNORMAL LOW (ref 8.9–10.3)
Chloride: 115 mmol/L — ABNORMAL HIGH (ref 98–111)
Creatinine, Ser: 1.81 mg/dL — ABNORMAL HIGH (ref 0.61–1.24)
GFR calc Af Amer: 41 mL/min — ABNORMAL LOW (ref >60)
GFR calc non Af Amer: 35 mL/min — ABNORMAL LOW (ref >60)
Glucose, Bld: 227 mg/dL — ABNORMAL HIGH (ref 70–99)
Potassium: 4.6 mmol/L (ref 3.5–5.1)
Sodium: 139 mmol/L (ref 135–145)
Total Bilirubin: 0.9 mg/dL (ref 0.3–1.2)
Total Protein: 6.5 g/dL (ref 6.5–8.1)

## 2019-02-08 LAB — CBC WITH DIFFERENTIAL/PLATELET
Abs Immature Granulocytes: 0.03 10*3/uL (ref 0.00–0.07)
Basophils Absolute: 0 10*3/uL (ref 0.0–0.1)
Basophils Relative: 0 %
Eosinophils Absolute: 0 10*3/uL (ref 0.0–0.5)
Eosinophils Relative: 0 %
HCT: 38.9 % — ABNORMAL LOW (ref 39.0–52.0)
Hemoglobin: 12.7 g/dL — ABNORMAL LOW (ref 13.0–17.0)
Immature Granulocytes: 1 %
Lymphocytes Relative: 15 %
Lymphs Abs: 0.7 10*3/uL (ref 0.7–4.0)
MCH: 27.8 pg (ref 26.0–34.0)
MCHC: 32.6 g/dL (ref 30.0–36.0)
MCV: 85.1 fL (ref 80.0–100.0)
Monocytes Absolute: 0.2 10*3/uL (ref 0.1–1.0)
Monocytes Relative: 3 %
Neutro Abs: 3.7 10*3/uL (ref 1.7–7.7)
Neutrophils Relative %: 81 %
Platelets: 118 10*3/uL — ABNORMAL LOW (ref 150–400)
RBC: 4.57 MIL/uL (ref 4.22–5.81)
RDW: 14.9 % (ref 11.5–15.5)
WBC: 4.7 10*3/uL (ref 4.0–10.5)
nRBC: 0 % (ref 0.0–0.2)

## 2019-02-08 LAB — D-DIMER, QUANTITATIVE: D-Dimer, Quant: 1.02 ug/mL-FEU — ABNORMAL HIGH (ref 0.00–0.50)

## 2019-02-08 LAB — ABO/RH: ABO/RH(D): A POS

## 2019-02-08 LAB — GLUCOSE, CAPILLARY
Glucose-Capillary: 218 mg/dL — ABNORMAL HIGH (ref 70–99)
Glucose-Capillary: 335 mg/dL — ABNORMAL HIGH (ref 70–99)
Glucose-Capillary: 150 mg/dL — ABNORMAL HIGH (ref 70–99)
Glucose-Capillary: 182 mg/dL — ABNORMAL HIGH (ref 70–99)

## 2019-02-08 LAB — TYPE AND SCREEN
ABO/RH(D): A POS
Antibody Screen: NEGATIVE

## 2019-02-08 LAB — MRSA PCR SCREENING: MRSA by PCR: NEGATIVE

## 2019-02-08 LAB — PHOSPHORUS: Phosphorus: 3.3 mg/dL (ref 2.5–4.6)

## 2019-02-08 LAB — FERRITIN: Ferritin: 322 ng/mL (ref 24–336)

## 2019-02-08 LAB — C-REACTIVE PROTEIN: CRP: 5.6 mg/dL — ABNORMAL HIGH (ref <1.0)

## 2019-02-08 LAB — BETA-HYDROXYBUTYRIC ACID: Beta-Hydroxybutyric Acid: 0.07 mmol/L (ref 0.05–0.27)

## 2019-02-08 LAB — MAGNESIUM: Magnesium: 1.7 mg/dL (ref 1.7–2.4)

## 2019-02-08 MED ORDER — SODIUM CHLORIDE 0.9% IV SOLUTION: Freq: Once | INTRAVENOUS | Status: DC

## 2019-02-08 MED ORDER — INSULIN GLARGINE 100 UNIT/ML ~~LOC~~ SOLN
15.0000 [IU] | Freq: Every day | SUBCUTANEOUS | Status: DC
  Administered 2019-02-08: 15 [IU] via SUBCUTANEOUS
  Filled 2019-02-08 (×2): qty 0.15

## 2019-02-08 MED ORDER — INSULIN ASPART 100 UNIT/ML ~~LOC~~ SOLN
4.0000 [IU] | Freq: Three times a day (TID) | SUBCUTANEOUS | Status: DC
  Administered 2019-02-08 – 2019-02-09 (×2): 4 [IU] via SUBCUTANEOUS

## 2019-02-08 MED ORDER — ACETAMINOPHEN 325 MG PO TABS
650.0000 mg | ORAL_TABLET | Freq: Once | ORAL | Status: AC
  Administered 2019-02-08: 650 mg via ORAL
  Filled 2019-02-08: qty 2

## 2019-02-08 MED ORDER — FUROSEMIDE 10 MG/ML IJ SOLN: 20.0000 mg | Freq: Once | INTRAMUSCULAR | Status: DC

## 2019-02-08 MED ORDER — SODIUM BICARBONATE 650 MG PO TABS
1300.0000 mg | ORAL_TABLET | Freq: Two times a day (BID) | ORAL | Status: DC
  Administered 2019-02-08 – 2019-02-14 (×12): 1300 mg via ORAL
  Filled 2019-02-08 (×15): qty 2

## 2019-02-08 MED ORDER — MAGNESIUM SULFATE 2 GM/50ML IV SOLN
2.0000 g | Freq: Once | INTRAVENOUS | Status: AC
  Administered 2019-02-08: 2 g via INTRAVENOUS
  Filled 2019-02-08: qty 50

## 2019-02-08 MED ORDER — METHYLPREDNISOLONE SODIUM SUCC 40 MG IJ SOLR
40.0000 mg | Freq: Two times a day (BID) | INTRAMUSCULAR | Status: DC
  Administered 2019-02-08 – 2019-02-11 (×6): 40 mg via INTRAVENOUS
  Filled 2019-02-08 (×6): qty 1

## 2019-02-08 MED ORDER — DIPHENHYDRAMINE HCL 50 MG/ML IJ SOLN
25.0000 mg | Freq: Once | INTRAMUSCULAR | Status: AC
  Administered 2019-02-08: 25 mg via INTRAVENOUS
  Filled 2019-02-08: qty 1

## 2019-02-08 MED ORDER — FUROSEMIDE 10 MG/ML IJ SOLN
40.0000 mg | Freq: Once | INTRAMUSCULAR | Status: AC
  Administered 2019-02-08: 40 mg via INTRAVENOUS
  Filled 2019-02-08: qty 4

## 2019-02-08 MED ORDER — ORAL CARE MOUTH RINSE
15.0000 mL | Freq: Two times a day (BID) | OROMUCOSAL | Status: DC
  Administered 2019-02-08 – 2019-02-13 (×9): 15 mL via OROMUCOSAL

## 2019-02-08 NOTE — Progress Notes (Signed)
Updated patients daughter inlaw, and she spoke to him as well on phone. Ryan Burke

## 2019-02-08 NOTE — Progress Notes (Signed)
1930-Used [employer] for interpretor to communicate and introduce self to patient, explained I would be starting his plasma infusion soon and that he temp in low and will be applying a warming blanket to him. 2000- Assisted patient to stand at side of bed to use urinal, patient became very SOB and dyspneic and PO2 SATs dropped to the 70's, recovered quickly with a little residual dyspnea after returned to bed. Call placed to [employer] interpretor, to check how patient was feeling and explain to patient I do not want him get OOB to void tonight due to this episode and would like him to use the urinal in the bed, per interpretor patient understands. 2100- Elink aware of all above.

## 2019-02-08 NOTE — Progress Notes (Signed)
Pt facetiming with family. Confirmed patient understands plasma administration and signed consent. Valinda Party

## 2019-02-08 NOTE — Progress Notes (Signed)
Inpatient Diabetes Program Recommendations  AACE/ADA: New Consensus Statement on Inpatient Glycemic Control   Target Ranges:  Prepandial:   less than 140 mg/dL      Peak postprandial:   less than 180 mg/dL (1-2 hours)      Critically ill patients:  140 - 180 mg/dL   Results for Ryan Burke, Ryan Burke (MRN [id number]) as of 02/08/2019 11:55  Ref. Range 02/06/2019 23:15 02/07/2019 06:30 02/07/2019 11:38 02/07/2019 23:31 02/08/2019 08:18  Glucose-Capillary Latest Ref Range: 70 - 99 mg/dL 184 (H) 260 (H) 243 (H) 284 (H) 218 (H)  Results for [name], [name] (MRN [id number]) as of 02/08/2019 11:55  Ref. Range 02/06/2019 22:53  Hemoglobin A1C Latest Ref Range: 4.8 - 5.6 % 11.1 (H)   Review of Glycemic Control  Diabetes history: DM2 Outpatient Diabetes medications: Farxiga 5 mg daily, Januvia 50 mg daily Current orders for Inpatient glycemic control: Novolog 0-15 units TID with meals, Novolog 0-5 units QHS; Solumedrol 36 mg BID  Inpatient Diabetes Program Recommendations:   Insulin - Basal: If steroids are continued, please consider ordering Levemir 10 units Q24H.  HgbA1C: A1C 11.1% on 02/06/19 indicating an average glucose of 272 mg/dl over the past 2-3 months.  Thanks, Barnie Alderman, RN, MSN, CDE Diabetes Coordinator Inpatient Diabetes Program [phone] (Team Pager from 8am to 5pm)

## 2019-02-08 NOTE — Progress Notes (Addendum)
PROGRESS NOTE                                                                                                                                                                                                             Patient Demographics:    Ryan Burke, is a 78 y.o. male, DOB - 08/10/1940, [id number]  Outpatient Primary MD for the patient is Fleet ContrasAvbuere, Edwin, MD   Admit date - 02/06/2019   LOS - 2  Chief Complaint  Patient presents with  . Covid positive  . Respiratory Distress  . Fever       Brief Narrative: Patient is a 78 y.o. male with PMHx of HTN, DM-2, CKD stage IIIb, s/p bioprosthetic aortic valve replacement-who presented with gradual worsening shortness of breath for the past 2-3 days, diarrhea-he was subsequently found to have acute hypoxic respiratory failure in the setting of COVID-19 pneumonia.   Subjective:    Ryan Burke today remains comfortable-is on 10 L of oxygen.  He denies any shortness of breath at rest.   Assessment  & Plan :   Acute Hypoxic Resp Failure due to Covid 19 Viral pneumonia: He seems to have improved somewhat-previously was on 15 L-has been titrated down to 10 L.  Continue steroids and remdesivir-we will go ahead and give 1 unit of Covid convalescent plasma.  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  Fever: afebrile  O2 requirements: On 10 l/m   COVID-19 Labs: Recent Labs    02/06/19 1911 02/07/19 0408 02/08/19 0610  DDIMER 1.83* 1.73* 1.02*  FERRITIN 255 274  --   LDH 356*  --   --   CRP 5.7* 6.8*  --     Lab Results  Component Value Date   SARSCOV2NAA Detected (A) 01/30/2019     COVID-19 Medications: Steroids:11/9>> Remdesivir: 11/9>> Actemra: Will avoid given history of latent tuberculosis. Convalescent Plasma: On 11/11 x 1. Research Studies:N/A  Other  medications: Diuretics:Euvolemic- Lasix  x1 to maintain negative balance Antibiotics:Not needed as no evidence of bacterial infection-we will stop Rocephin/Zithromax.  Prone/Incentive Spirometry: encouraged patient to lie prone for 3-4 hours at a time for a total of 16 hours a day, and to encourage incentive spirometry use 3-4/hour.  DVT Prophylaxis  :  Lovenox   Non-anion gap metabolic acidosis: Not sure if this is secondary to diarrhea or from type IV RTA (potassium on the higher side as well).  Does not appear to be in DKA-but will go ahead and check beta hydroxybutyrate.  Start oral bicarb supplementation.  CKD stage IIIb: Creatinine close to usual baseline-follow.  DM-2 with uncontrolled hyperglycemia (A1c 11.1 on 02/06/2019): Hyperglycemia likely has worsened due to steroids-but significantly elevated A1c indicating poor long-termglycemic control.  Start 15 units of Lantus, add 4 units of NovoLog with meals.  Follow and adjust.  CBG (last 3)  Recent Labs    02/07/19 1138 02/07/19 2331 02/08/19 0818  GLUCAP 243* 284* 218*   Hypertension: Controlled-continue Cardura  Dyslipidemia: Continue statin  History of aortic valve replacement with bioprosthetic valve   ABG:    Component Value Date/Time   PHART 7.372 02/06/2019 1848   PCO2ART 21.7 (L) 02/06/2019 1848   PO2ART 98.8 02/06/2019 1848   HCO3 12.3 (L) 02/06/2019 1848   TCO2 25 03/30/2013 1229   ACIDBASEDEF 11.2 (H) 02/06/2019 1848   O2SAT 97.3 02/06/2019 1848    Vent Settings: N/  Condition - Extremely Guarded-very tenuous with risk for further deterioration  Family Communication  : Son/daughter-in-law updated over the phone  Code Status :  Full Code  Diet :  Diet Order            Diet Carb Modified Fluid consistency: Thin; Room service appropriate? Yes  Diet effective now               Disposition Plan  :  Remain hospitalized  Barriers to discharge: Hypoxia requiring O2 supplementation/complete 5 days  of IV Remdesivir  Consults  :  None  Procedures  :  None  Antibiotics  :    Anti-infectives (From admission, onward)   Start     Dose/Rate Route Frequency Ordered Stop   02/07/19 2000  remdesivir 100 mg in sodium chloride 0.9 % 250 mL IVPB     100 mg 500 mL/hr over 30 Minutes Intravenous Every 24 hours 02/06/19 2216 02/11/19 1559   02/06/19 2245  remdesivir 200 mg in sodium chloride 0.9 % 250 mL IVPB     200 mg 500 mL/hr over 30 Minutes Intravenous Once 02/06/19 2216 02/07/19 0010   02/06/19 2245  cefTRIAXone (ROCEPHIN) 1 g in sodium chloride 0.9 % 100 mL IVPB     1 g 200 mL/hr over 30 Minutes Intravenous Every 24 hours 02/06/19 2238     02/06/19 2245  azithromycin (ZITHROMAX) 500 mg in sodium chloride 0.9 % 250 mL IVPB     500 mg 250 mL/hr over 60 Minutes Intravenous Every 24 hours 02/06/19 2238        Inpatient Medications  Scheduled Meds: . sodium chloride   Intravenous Once  . acetaminophen  650 mg Oral Once  . aspirin EC  81 mg Oral Daily  . Chlorhexidine Gluconate Cloth  6 each Topical Daily  . diphenhydrAMINE  25 mg Intravenous Once  . docusate sodium  100 mg Oral BID  . doxazosin  4 mg Oral Daily  . enoxaparin (LOVENOX) injection  40 mg Subcutaneous QHS  . furosemide  20 mg Intravenous Once  . insulin aspart  0-15 Units Subcutaneous TID WC  . insulin aspart  0-5 Units Subcutaneous QHS  . methylPREDNISolone (SOLU-MEDROL) injection  0.5 mg/kg Intravenous BID  . metoprolol tartrate  25 mg Oral BID  . pantoprazole  40 mg Oral Daily  . pravastatin  20 mg Oral QPM  . sodium chloride flush  3 mL Intravenous Q12H  . sodium chloride flush  3 mL Intravenous Q12H   Continuous Infusions: . sodium chloride    . azithromycin 500 mg (02/07/19 2358)  . cefTRIAXone (ROCEPHIN)  IV 1 g (02/07/19 2315)  . remdesivir 100 mg in NS 250 mL 100 mg (02/07/19 2030)   PRN Meds:.sodium chloride, acetaminophen, albuterol, bisacodyl, ondansetron **OR** ondansetron (ZOFRAN) IV, oxyCODONE,  polyethylene glycol, sodium chloride flush, zolpidem   Time Spent in minutes 35    See all Orders from today for further details   Jeoffrey Massed M.D on 02/08/2019 at 11:46 AM  To page go to [email or web address] - use universal password  [facility] -  Office  [phone]    Objective:   Vitals:   02/08/19 0800 02/08/19 0900 02/08/19 1000 02/08/19 1100  BP: 126/80 117/74 118/70 111/70  Pulse: 63 71 64 (!) 58  Resp: (!) 21 14 (!) 24 19  Temp: (!) 97 F (36.1 C)     TempSrc: Oral     SpO2: 99% (!) 88% 93% 94%  Weight:      Height:        Wt Readings from Last 3 Encounters:  02/07/19 70.7 kg  08/27/16 71.8 kg  04/20/13 70.1 kg     Intake/Output Summary (Last 24 hours) at 02/08/2019 1146 Last data filed at 02/08/2019 0600 Gross per 24 hour  Intake 622.54 ml  Output 475 ml  Net 147.54 ml     Physical Exam Gen Exam:Alert awake-not in any distress HEENT:atraumatic, normocephalic Chest: B/L clear to auscultation anteriorly CVS:S1S2 regular Abdomen:soft non tender, non distended Extremities:no edema Neurology: Non focal Skin: no rash   Data Review:    CBC Recent Labs  Lab 02/06/19 1911 02/07/19 0408 02/08/19 0610  WBC 2.7* 2.1* 4.7  HGB 11.5* 12.1* 12.7*  HCT 34.5* 37.3* 38.9*  PLT 92* 85* 118*  MCV 84.8 86.1 85.1  MCH 28.3 27.9 27.8  MCHC 33.3 32.4 32.6  RDW 14.2 14.5 14.9  LYMPHSABS 0.6* 0.5* 0.7  MONOABS 0.2 0.1 0.2  EOSABS 0.1 0.0 0.0  BASOSABS 0.0 0.0 0.0    Chemistries  Recent Labs  Lab 02/06/19 1911 02/07/19 0408 02/08/19 0610  NA 134* 136 139  K 3.8 5.7* 4.6  CL 115* 115* 115*  CO2 13* 13* 14*  GLUCOSE 160* 272* 227*  BUN 17 21 34*  CREATININE 1.76* 1.74* 1.81*  CALCIUM 7.2* 7.1* 8.0*  MG  --  1.2* 1.7  AST 63* 64* 49*  ALT 27 26 26   ALKPHOS 161* 157* 169*  BILITOT 1.3* 1.2 0.9   ------------------------------------------------------------------------------------------------------------------ Recent Labs    02/06/19  1911  TRIG 134    Lab Results  Component Value Date   HGBA1C 11.1 (H) 02/06/2019   ------------------------------------------------------------------------------------------------------------------ No results for input(s): TSH, T4TOTAL, T3FREE, THYROIDAB in the last 72 hours.  Invalid input(s): FREET3 ------------------------------------------------------------------------------------------------------------------ Recent Labs    02/06/19 1911 02/07/19 0408  FERRITIN 255 274    Coagulation profile No results for input(s): INR, PROTIME in the last 168 hours.  Recent Labs    02/07/19 0408 02/08/19 0610  DDIMER 1.73* 1.02*    Cardiac Enzymes No results for input(s): CKMB, TROPONINI, MYOGLOBIN in the last 168 hours.  Invalid input(s): CK ------------------------------------------------------------------------------------------------------------------    Component Value Date/Time   BNP 88.0 02/06/2019 1911    Micro Results Recent Results (from the past 240 hour(s))  Novel Coronavirus, NAA ([facility])  Status: Abnormal   Collection Time: 01/30/19 12:00 AM   Specimen: Nasopharyngeal(NP) swabs in vial transport medium   NASOPHARYNGE  TESTING  Result Value Ref Range Status   SARS-CoV-2, NAA Detected (A) Not Detected Final    Comment: This nucleic acid amplification test was developed and its performance characteristics determined by [employer]. Nucleic acid amplification tests include PCR and TMA. This test has not been FDA cleared or approved. This test has been authorized by FDA under an Emergency Use Authorization (EUA). This test is only authorized for the duration of time the declaration that circumstances exist justifying the authorization of the emergency use of in vitro diagnostic tests for detection of SARS-CoV-2 virus and/or diagnosis of COVID-19 infection under section 564(b)(1) of the Act, 21 U.S.C. 696EXB-2(W) (1), unless the authorization is  terminated or revoked sooner. When diagnostic testing is negative, the possibility of a false negative result should be considered in the context of a patient's recent exposures and the presence of clinical signs and symptoms consistent with COVID-19. An individual without symptoms of COVID-19 and who is not shedding SARS-CoV-2 virus would  expect to have a negative (not detected) result in this assay.   Blood Culture (routine x 2)     Status: None (Preliminary result)   Collection Time: 02/06/19  7:11 PM   Specimen: BLOOD  Result Value Ref Range Status   Specimen Description   Final    BLOOD RIGHT ANTECUBITAL Performed at [facility], [address] [place], [place] [address]    Special Requests   Final    BOTTLES DRAWN AEROBIC AND ANAEROBIC Blood Culture results may not be optimal due to an excessive volume of blood received in culture bottles Performed at [facility], [address], [place], [place] [address]    Culture   Final    NO GROWTH 1 DAY Performed at [facility], [address], [place], [place] [address]    Report Status PENDING  Incomplete  Blood Culture (routine x 2)     Status: None (Preliminary result)   Collection Time: 02/06/19  7:11 PM   Specimen: BLOOD  Result Value Ref Range Status   Specimen Description   Final    BLOOD BLOOD RIGHT HAND Performed at [facility], [address], [place], [place] [address]    Special Requests   Final    BOTTLES DRAWN AEROBIC AND ANAEROBIC Blood Culture adequate volume Performed at [facility], [address] [place], [place] [address]    Culture   Final    NO GROWTH 1 DAY Performed at [facility], [address] [place], [place] [address]    Report Status PENDING  Incomplete  MRSA PCR Screening     Status: None   Collection Time: 02/07/19  9:15 PM   Specimen: Nasopharyngeal  Result Value Ref Range Status   MRSA by PCR  NEGATIVE NEGATIVE Final    Comment:        The GeneXpert MRSA Assay (FDA approved for NASAL specimens only), is one component of a comprehensive MRSA colonization surveillance program. It is not intended to diagnose MRSA infection nor to guide or monitor treatment for MRSA infections. Performed at [facility], [address] [place], [place] [address]     Radiology Reports Dg Chest Port 1 View  Result Date: 02/06/2019 CLINICAL DATA:  Diarrhea and short of breath EXAM: PORTABLE CHEST 1 VIEW COMPARISON:  08/28/2016  FINDINGS: Post sternotomy changes. Diffuse bilateral reticular opacity, suspicious for underlying chronic interstitial lung disease. More confluent ground-glass opacity at the bases and periphery of the right lung. Mild cardiomegaly. No pneumothorax. IMPRESSION: Suspected underlying component of chronic interstitial lung disease. Right greater than left ground-glass opacities and peripheral consolidations, suspect for pneumonia, to include atypical or viral pneumonia. Electronically Signed   By: Jasmine Pang M.D.   On: 02/06/2019 19:50

## 2019-02-09 DIAGNOSIS — J9601 Acute respiratory failure with hypoxia: Secondary | ICD-10-CM | POA: Diagnosis not present

## 2019-02-09 DIAGNOSIS — E785 Hyperlipidemia, unspecified: Secondary | ICD-10-CM | POA: Diagnosis not present

## 2019-02-09 DIAGNOSIS — I10 Essential (primary) hypertension: Secondary | ICD-10-CM | POA: Diagnosis not present

## 2019-02-09 DIAGNOSIS — U071 COVID-19: Secondary | ICD-10-CM | POA: Diagnosis not present

## 2019-02-09 LAB — CBC WITH DIFFERENTIAL/PLATELET
Abs Immature Granulocytes: 0.04 10*3/uL (ref 0.00–0.07)
Basophils Absolute: 0 10*3/uL (ref 0.0–0.1)
Basophils Relative: 0 %
Eosinophils Absolute: 0 10*3/uL (ref 0.0–0.5)
Eosinophils Relative: 0 %
HCT: 36.5 % — ABNORMAL LOW (ref 39.0–52.0)
Hemoglobin: 12.2 g/dL — ABNORMAL LOW (ref 13.0–17.0)
Immature Granulocytes: 1 %
Lymphocytes Relative: 10 %
Lymphs Abs: 0.5 10*3/uL — ABNORMAL LOW (ref 0.7–4.0)
MCH: 28.2 pg (ref 26.0–34.0)
MCHC: 33.4 g/dL (ref 30.0–36.0)
MCV: 84.3 fL (ref 80.0–100.0)
Monocytes Absolute: 0.2 10*3/uL (ref 0.1–1.0)
Monocytes Relative: 3 %
Neutro Abs: 4.9 10*3/uL (ref 1.7–7.7)
Neutrophils Relative %: 86 %
Platelets: 130 10*3/uL — ABNORMAL LOW (ref 150–400)
RBC: 4.33 MIL/uL (ref 4.22–5.81)
RDW: 15.2 % (ref 11.5–15.5)
WBC: 5.6 10*3/uL (ref 4.0–10.5)
nRBC: 0 % (ref 0.0–0.2)

## 2019-02-09 LAB — COMPREHENSIVE METABOLIC PANEL
ALT: 29 U/L (ref 0–44)
AST: 51 U/L — ABNORMAL HIGH (ref 15–41)
Albumin: 2.7 g/dL — ABNORMAL LOW (ref 3.5–5.0)
Alkaline Phosphatase: 161 U/L — ABNORMAL HIGH (ref 38–126)
Anion gap: 10 (ref 5–15)
BUN: 44 mg/dL — ABNORMAL HIGH (ref 8–23)
CO2: 16 mmol/L — ABNORMAL LOW (ref 22–32)
Calcium: 8.2 mg/dL — ABNORMAL LOW (ref 8.9–10.3)
Chloride: 112 mmol/L — ABNORMAL HIGH (ref 98–111)
Creatinine, Ser: 1.86 mg/dL — ABNORMAL HIGH (ref 0.61–1.24)
GFR calc Af Amer: 40 mL/min — ABNORMAL LOW (ref >60)
GFR calc non Af Amer: 34 mL/min — ABNORMAL LOW (ref >60)
Glucose, Bld: 232 mg/dL — ABNORMAL HIGH (ref 70–99)
Potassium: 4.6 mmol/L (ref 3.5–5.1)
Sodium: 138 mmol/L (ref 135–145)
Total Bilirubin: 1 mg/dL (ref 0.3–1.2)
Total Protein: 6.4 g/dL — ABNORMAL LOW (ref 6.5–8.1)

## 2019-02-09 LAB — BPAM FFP
Blood Product Expiration Date: 202011121804
ISSUE DATE / TIME: 202011111901
Unit Type and Rh: 6200

## 2019-02-09 LAB — PREPARE FRESH FROZEN PLASMA

## 2019-02-09 LAB — D-DIMER, QUANTITATIVE: D-Dimer, Quant: 0.73 ug/mL-FEU — ABNORMAL HIGH (ref 0.00–0.50)

## 2019-02-09 LAB — GLUCOSE, CAPILLARY
Glucose-Capillary: 215 mg/dL — ABNORMAL HIGH (ref 70–99)
Glucose-Capillary: 335 mg/dL — ABNORMAL HIGH (ref 70–99)
Glucose-Capillary: 121 mg/dL — ABNORMAL HIGH (ref 70–99)

## 2019-02-09 LAB — MAGNESIUM: Magnesium: 2 mg/dL (ref 1.7–2.4)

## 2019-02-09 LAB — C-REACTIVE PROTEIN: CRP: 3.2 mg/dL — ABNORMAL HIGH (ref <1.0)

## 2019-02-09 LAB — FERRITIN: Ferritin: 238 ng/mL (ref 24–336)

## 2019-02-09 MED ORDER — FUROSEMIDE 10 MG/ML IJ SOLN
80.0000 mg | Freq: Once | INTRAMUSCULAR | Status: AC
  Administered 2019-02-09: 80 mg via INTRAVENOUS
  Filled 2019-02-09: qty 8

## 2019-02-09 MED ORDER — INSULIN GLARGINE 100 UNIT/ML ~~LOC~~ SOLN
20.0000 [IU] | Freq: Every day | SUBCUTANEOUS | Status: DC
  Administered 2019-02-09 – 2019-02-12 (×4): 20 [IU] via SUBCUTANEOUS
  Filled 2019-02-09 (×4): qty 0.2

## 2019-02-09 MED ORDER — INSULIN ASPART 100 UNIT/ML ~~LOC~~ SOLN
6.0000 [IU] | Freq: Three times a day (TID) | SUBCUTANEOUS | Status: DC
  Administered 2019-02-09 – 2019-02-12 (×8): 6 [IU] via SUBCUTANEOUS

## 2019-02-09 NOTE — Progress Notes (Addendum)
PROGRESS NOTE                                                                                                                                                                                                             Patient Demographics:    Ryan Burke, is a 78 y.o. male, DOB - 04/03/1940, ZOX:[id number]  Outpatient Primary MD for the patient is Fleet ContrasAvbuere, Edwin, MD   Admit date - 02/06/2019   LOS - 3  Chief Complaint  Patient presents with  . Covid positive  . Respiratory Distress  . Fever       Brief Narrative: Patient is a 78 y.o. male with PMHx of HTN, DM-2, CKD stage IIIb, s/p bioprosthetic aortic valve replacement-who presented with gradual worsening shortness of breath for the past 2-3 days, diarrhea-he was subsequently found to have acute hypoxic respiratory failure in the setting of COVID-19 pneumonia.   Subjective:    [employer] today feels better-no SOB at rest. Anxious/Inquiring as to when he will be discharged home. He is still on 10L of O2-looks very comfortable   Assessment  & Plan :   Acute Hypoxic Resp Failure due to Covid 19 Viral pneumonia: He remains stable on 10L.Continue steroids and remdesivir.  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  Fever: afebrile  O2 requirements: On 10 l/m   COVID-19 Labs: Recent Labs    02/06/19 1911 02/07/19 0408 02/08/19 0610 02/09/19 0635  DDIMER 1.83* 1.73* 1.02* 0.73*  FERRITIN 255 274 322 238  LDH 356*  --   --   --   CRP 5.7* 6.8* 5.6* 3.2*    Lab Results  Component Value Date   SARSCOV2NAA Detected (A) 01/30/2019     COVID-19 Medications: Steroids:11/9>> Remdesivir: 11/9>> Actemra: Will avoid given history of latent tuberculosis. Convalescent Plasma: On 11/11 x 1. Research Studies:N/A  Other medications: Diuretics:Euvolemic-Repeat Lasix x1  today-keep in neg balance Antibiotics:Not needed as no evidence of bacterial infection- Rocephin/Zithromax d/c'd 11/11  Prone/Incentive Spirometry: encouraged patient to lie prone for 3-4 hours at a time for a total of 16 hours a day, and to encourage incentive spirometry use 3-4/hour.  DVT Prophylaxis  :  Lovenox   Non-anion gap metabolic acidosis: suspect 2/2 CKD/Diarrhea. Improving with supportive  care. Beta hydroxy butyrate neg-non gapped acidosis-hence doubt DKA.  CKD stage IIIb: Creatinine close to baseline-follow  DM-2 with uncontrolled hyperglycemia (A1c 11.1 on 02/06/2019): Hyperglycemia likely has worsened due to steroids-but significantly elevated A1c indicating poor long-termglycemic control. CBG's still uncontrolled-increase Lantus to 20 units, increase premeal novolog to 6 units-follow and adjust.    CBG (last 3)  Recent Labs    02/08/19 1154 02/08/19 1719 02/08/19 2126  GLUCAP 335* 150* 182*   Hypertension: controlled-continue cardura  Dyslipidemia: Continue statin  History of aortic valve replacement with bioprosthetic valve   ABG:    Component Value Date/Time   PHART 7.372 02/06/2019 1848   PCO2ART 21.7 (L) 02/06/2019 1848   PO2ART 98.8 02/06/2019 1848   HCO3 12.3 (L) 02/06/2019 1848   TCO2 25 03/30/2013 1229   ACIDBASEDEF 11.2 (H) 02/06/2019 1848   O2SAT 97.3 02/06/2019 1848    Vent Settings: N/  Condition -extremely guarded  Family Communication  : Spoke with son over the phone on 11/12.  Code Status :  Full Code  Diet :  Diet Order            Diet Carb Modified Fluid consistency: Thin; Room service appropriate? Yes  Diet effective now               Disposition Plan  :  Remain hospitalized-but ok to transfer to PCU-he is a "happy hypoxic" that has been stable on 10 L for the past few days  Barriers to discharge: Hypoxia requiring O2 supplementation/complete 5 days of IV Remdesivir  Consults  :  None  Procedures  :  None  Antibiotics   :    Anti-infectives (From admission, onward)   Start     Dose/Rate Route Frequency Ordered Stop   02/07/19 2000  remdesivir 100 mg in sodium chloride 0.9 % 250 mL IVPB     100 mg 500 mL/hr over 30 Minutes Intravenous Every 24 hours 02/06/19 2216 02/11/19 1559   02/06/19 2245  remdesivir 200 mg in sodium chloride 0.9 % 250 mL IVPB     200 mg 500 mL/hr over 30 Minutes Intravenous Once 02/06/19 2216 02/07/19 0010   02/06/19 2245  cefTRIAXone (ROCEPHIN) 1 g in sodium chloride 0.9 % 100 mL IVPB  Status:  Discontinued     1 g 200 mL/hr over 30 Minutes Intravenous Every 24 hours 02/06/19 2238 02/08/19 1157   02/06/19 2245  azithromycin (ZITHROMAX) 500 mg in sodium chloride 0.9 % 250 mL IVPB  Status:  Discontinued     500 mg 250 mL/hr over 60 Minutes Intravenous Every 24 hours 02/06/19 2238 02/08/19 1157      Inpatient Medications  Scheduled Meds: . sodium chloride   Intravenous Once  . aspirin EC  81 mg Oral Daily  . Chlorhexidine Gluconate Cloth  6 each Topical Daily  . docusate sodium  100 mg Oral BID  . doxazosin  4 mg Oral Daily  . enoxaparin (LOVENOX) injection  40 mg Subcutaneous QHS  . furosemide  80 mg Intravenous Once  . insulin aspart  0-15 Units Subcutaneous TID WC  . insulin aspart  0-5 Units Subcutaneous QHS  . insulin aspart  6 Units Subcutaneous TID WC  . insulin glargine  20 Units Subcutaneous Daily  . mouth rinse  15 mL Mouth Rinse BID  . methylPREDNISolone (SOLU-MEDROL) injection  40 mg Intravenous Q12H  . metoprolol tartrate  25 mg Oral BID  . pantoprazole  40 mg Oral Daily  . pravastatin  20 mg Oral QPM  . sodium bicarbonate  1,300 mg Oral BID  . sodium chloride flush  3 mL Intravenous Q12H  . sodium chloride flush  3 mL Intravenous Q12H   Continuous Infusions: . sodium chloride    . remdesivir 100 mg in NS 250 mL 100 mg (02/08/19 1644)   PRN Meds:.sodium chloride, acetaminophen, albuterol, bisacodyl, ondansetron **OR** ondansetron (ZOFRAN) IV, oxyCODONE,  polyethylene glycol, sodium chloride flush, zolpidem   Time Spent in minutes 35    See all Orders from today for further details   Oren Binet M.D on 02/09/2019 at 8:28 AM  To page go to [email or web address] - use universal password  [facility] -  Office  [phone]    Objective:   Vitals:   02/09/19 0500 02/09/19 0600 02/09/19 0700 02/09/19 0748  BP: 133/83 140/76 138/79   Pulse: (!) 49 (!) 51 (!) 51   Resp: 17 19 18  (!) 30  Temp:      TempSrc:      SpO2: 94% 95% 96% (!) 88%  Weight:      Height:        Wt Readings from Last 3 Encounters:  02/07/19 70.7 kg  08/27/16 71.8 kg  04/20/13 70.1 kg     Intake/Output Summary (Last 24 hours) at 02/09/2019 0828 Last data filed at 02/09/2019 0347 Gross per 24 hour  Intake 700 ml  Output 1125 ml  Net -425 ml     Physical Exam Gen Exam:Alert awake-not in any distress HEENT:atraumatic, normocephalic Chest: B/L clear to auscultation anteriorly CVS:S1S2 regular Abdomen:soft non tender, non distended Extremities:no edema Neurology: Non focal Skin: no rash   Data Review:    CBC Recent Labs  Lab 02/06/19 1911 02/07/19 0408 02/08/19 0610 02/09/19 0635  WBC 2.7* 2.1* 4.7 5.6  HGB 11.5* 12.1* 12.7* 12.2*  HCT 34.5* 37.3* 38.9* 36.5*  PLT 92* 85* 118* 130*  MCV 84.8 86.1 85.1 84.3  MCH 28.3 27.9 27.8 28.2  MCHC 33.3 32.4 32.6 33.4  RDW 14.2 14.5 14.9 15.2  LYMPHSABS 0.6* 0.5* 0.7 0.5*  MONOABS 0.2 0.1 0.2 0.2  EOSABS 0.1 0.0 0.0 0.0  BASOSABS 0.0 0.0 0.0 0.0    Chemistries  Recent Labs  Lab 02/06/19 1911 02/07/19 0408 02/08/19 0610 02/09/19 0635  NA 134* 136 139 138  K 3.8 5.7* 4.6 4.6  CL 115* 115* 115* 112*  CO2 13* 13* 14* 16*  GLUCOSE 160* 272* 227* 232*  BUN 17 21 34* 44*  CREATININE 1.76* 1.74* 1.81* 1.86*  CALCIUM 7.2* 7.1* 8.0* 8.2*  MG  --  1.2* 1.7 2.0  AST 63* 64* 49* 51*  ALT 27 26 26 29   ALKPHOS 161* 157* 169* 161*  BILITOT 1.3* 1.2 0.9 1.0    ------------------------------------------------------------------------------------------------------------------ Recent Labs    02/06/19 1911  TRIG 134    Lab Results  Component Value Date   HGBA1C 11.1 (H) 02/06/2019   ------------------------------------------------------------------------------------------------------------------ No results for input(s): TSH, T4TOTAL, T3FREE, THYROIDAB in the last 72 hours.  Invalid input(s): FREET3 ------------------------------------------------------------------------------------------------------------------ Recent Labs    02/08/19 0610 02/09/19 0635  FERRITIN 322 238    Coagulation profile No results for input(s): INR, PROTIME in the last 168 hours.  Recent Labs    02/08/19 0610 02/09/19 0635  DDIMER 1.02* 0.73*    Cardiac Enzymes No results for input(s): CKMB, TROPONINI, MYOGLOBIN in the last 168 hours.  Invalid input(s): CK ------------------------------------------------------------------------------------------------------------------    Component Value Date/Time   BNP 88.0 02/06/2019 1911  Micro Results Recent Results (from the past 240 hour(s))  Blood Culture (routine x 2)     Status: None (Preliminary result)   Collection Time: 02/06/19  7:11 PM   Specimen: BLOOD  Result Value Ref Range Status   Specimen Description   Final    BLOOD RIGHT ANTECUBITAL Performed at [facility], [address], [place], [place] [address]    Special Requests   Final    BOTTLES DRAWN AEROBIC AND ANAEROBIC Blood Culture results may not be optimal due to an excessive volume of blood received in culture bottles Performed at [facility], [address] [place], [place] [address]    Culture   Final    NO GROWTH 2 DAYS Performed at [facility], [address] [place], [place] [address]    Report Status PENDING  Incomplete  Blood Culture (routine x 2)     Status: None (Preliminary  result)   Collection Time: 02/06/19  7:11 PM   Specimen: BLOOD  Result Value Ref Range Status   Specimen Description   Final    BLOOD BLOOD RIGHT HAND Performed at [facility], [address], [place], [place] [address]    Special Requests   Final    BOTTLES DRAWN AEROBIC AND ANAEROBIC Blood Culture adequate volume Performed at [facility], [address] [place], [place] [address]    Culture   Final    NO GROWTH 2 DAYS Performed at [facility], [address], [place], [place] [address]    Report Status PENDING  Incomplete  MRSA PCR Screening     Status: None   Collection Time: 02/07/19  9:15 PM   Specimen: Nasopharyngeal  Result Value Ref Range Status   MRSA by PCR NEGATIVE NEGATIVE Final    Comment:        The GeneXpert MRSA Assay (FDA approved for NASAL specimens only), is one component of a comprehensive MRSA colonization surveillance program. It is not intended to diagnose MRSA infection nor to guide or monitor treatment for MRSA infections. Performed at [facility], [address] [place], [place] [address]     Radiology Reports Dg Chest Port 1 View  Result Date: 02/06/2019 CLINICAL DATA:  Diarrhea and short of breath EXAM: PORTABLE CHEST 1 VIEW COMPARISON:  08/28/2016 FINDINGS: Post sternotomy changes. Diffuse bilateral reticular opacity, suspicious for underlying chronic interstitial lung disease. More confluent ground-glass opacity at the bases and periphery of the right lung. Mild cardiomegaly. No pneumothorax. IMPRESSION: Suspected underlying component of chronic interstitial lung disease. Right greater than left ground-glass opacities and peripheral consolidations, suspect for pneumonia, to include atypical or viral pneumonia. Electronically Signed   By: Jasmine Pang M.D.   On: 02/06/2019 19:50

## 2019-02-09 NOTE — Evaluation (Signed)
Physical Therapy Evaluation Patient Details Name: Ryan Burke MRN: [id number] DOB: [birth year] Today's Date: 02/09/2019   History of Present Illness  78 y/o male w/ hx of SOB, sz, mitral regurgitation, HTN, HLD, heart murmur, out, GERD, DM, CKD, chronic bronchitis, chest pain at rest, asthma, aortic regurgitation w/ valave replacements.  Clinical Impression   Pt admitted with above diagnosis. Pt has some difficulty with hx as he only speaks [place], has some difficulty hearing interpreter and also answering some of her questions needing reiteration of repetition of question. He states that prior to hospitalization he was at home with family and was able to ambulate with a cane.  Pt currently with functional limitations due to the deficits listed below (see PT Problem List). He has increased fatigue/lethargy during assessment, decreased strength, functional independence and also activity tolerance. Pt was on 15L/min via HFNC during assessment, sats stayed in 80s throughout with lowest value noted to be 85%. With cues minimally able to complete pursed lip breathing but was also able to regain saturation levels to high 80s and 90s. He showed increased work of breathing with most activity.  Pt will benefit from skilled PT to increase their independence and safety with mobility to allow discharge to the venue listed below.       Follow Up Recommendations Home health PT    Equipment Recommendations  Rolling walker with 5" wheels    Recommendations for Other Services Rehab consult     Precautions / Restrictions Precautions Precautions: Fall Restrictions Weight Bearing Restrictions: No      Mobility  Bed Mobility Overal bed mobility: Needs Assistance Bed Mobility: Supine to Sit     Supine to sit: Mod assist     General bed mobility comments: need a to get BLE off and back on bed  Transfers Overall transfer level: Needs assistance Equipment used: 1 person hand held assist Transfers:  Sit to/from Stand Sit to Stand: Mod assist            Ambulation/Gait             General Gait Details: did not attempt ambulation as yet sec to fatigue  Stairs            Wheelchair Mobility    Modified Rankin (Stroke Patients Only)       Balance Overall balance assessment: Modified Independent;Needs assistance Sitting-balance support: Bilateral upper extremity supported;Feet supported Sitting balance-Leahy Scale: Fair     Standing balance support: Single extremity supported Standing balance-Leahy Scale: Fair                               Pertinent Vitals/Pain Pain Assessment: No/denies pain(denied pain stated only gets out of breath)    Home Living Family/patient expects to be discharged to:: Private residence Living Arrangements: Spouse/significant other;Children;Other relatives Available Help at Discharge: Family Type of Home: House Home Access: Stairs to enter     Home Layout: Two level Home Equipment: Cane - single point Additional Comments: had difficult time with hearing interpreter, took increased time to respond or need question reinforced    Prior Function Level of Independence: Independent with assistive device(s)               Hand Dominance        Extremity/Trunk Assessment   Upper Extremity Assessment Upper Extremity Assessment: Defer to OT evaluation    Lower Extremity Assessment Lower Extremity Assessment: Generalized weakness  Communication   Communication: Prefers language other than English;Interpreter utilized;Receptive difficulties;Expressive difficulties;HOH  Cognition Arousal/Alertness: Lethargic Behavior During Therapy: Flat affect Overall Cognitive Status: No family/caregiver present to determine baseline cognitive functioning                                 General Comments: seems to be fatigued and lethargic during assessment      General Comments      Exercises      Assessment/Plan    PT Assessment Patient needs continued PT services  PT Problem List Decreased strength;Decreased mobility;Decreased safety awareness;Decreased coordination;Decreased activity tolerance;Decreased balance       PT Treatment Interventions Gait training;Functional mobility training;Neuromuscular re-education;Balance training;Therapeutic exercise;Therapeutic activities;Patient/family education    PT Goals (Current goals can be found in the Care Plan section)  Acute Rehab PT Goals Patient Stated Goal: did not state goals at this time Time For Goal Achievement: 02/23/19 Potential to Achieve Goals: Fair    Frequency Min 3X/week   Barriers to discharge        Co-evaluation               AM-PAC PT "6 Clicks" Mobility  Outcome Measure Help needed turning from your back to your side while in a flat bed without using bedrails?: A Little Help needed moving from lying on your back to sitting on the side of a flat bed without using bedrails?: A Lot Help needed moving to and from a bed to a chair (including a wheelchair)?: A Lot Help needed standing up from a chair using your arms (e.g., wheelchair or bedside chair)?: A Lot Help needed to walk in hospital room?: A Lot Help needed climbing 3-5 steps with a railing? : A Lot 6 Click Score: 13    End of Session Equipment Utilized During Treatment: Oxygen Activity Tolerance: Patient limited by lethargy;Patient limited by fatigue;Treatment limited secondary to medical complications (Comment) Patient left: in bed;with bed alarm set Nurse Communication: Mobility status PT Visit Diagnosis: Unsteadiness on feet (R26.81);Other abnormalities of gait and mobility (R26.89);Muscle weakness (generalized) (M62.81)    Time: 0347-4259 PT Time Calculation (min) (ACUTE ONLY): 18 min   Charges:   PT Evaluation $PT Eval Moderate Complexity: [place], PT   Delford Field 02/09/2019, 4:09 PM

## 2019-02-10 DIAGNOSIS — Z952 Presence of prosthetic heart valve: Secondary | ICD-10-CM | POA: Diagnosis not present

## 2019-02-10 DIAGNOSIS — I10 Essential (primary) hypertension: Secondary | ICD-10-CM | POA: Diagnosis not present

## 2019-02-10 DIAGNOSIS — U071 COVID-19: Secondary | ICD-10-CM | POA: Diagnosis not present

## 2019-02-10 DIAGNOSIS — J9601 Acute respiratory failure with hypoxia: Secondary | ICD-10-CM | POA: Diagnosis not present

## 2019-02-10 DIAGNOSIS — J069 Acute upper respiratory infection, unspecified: Secondary | ICD-10-CM | POA: Diagnosis not present

## 2019-02-10 LAB — CBC WITH DIFFERENTIAL/PLATELET
Abs Immature Granulocytes: 0.08 10*3/uL — ABNORMAL HIGH (ref 0.00–0.07)
Basophils Absolute: 0 10*3/uL (ref 0.0–0.1)
Basophils Relative: 0 %
Eosinophils Absolute: 0 10*3/uL (ref 0.0–0.5)
Eosinophils Relative: 0 %
HCT: 37.9 % — ABNORMAL LOW (ref 39.0–52.0)
Hemoglobin: 12.7 g/dL — ABNORMAL LOW (ref 13.0–17.0)
Immature Granulocytes: 1 %
Lymphocytes Relative: 9 %
Lymphs Abs: 0.6 10*3/uL — ABNORMAL LOW (ref 0.7–4.0)
MCH: 27.9 pg (ref 26.0–34.0)
MCHC: 33.5 g/dL (ref 30.0–36.0)
MCV: 83.1 fL (ref 80.0–100.0)
Monocytes Absolute: 0.3 10*3/uL (ref 0.1–1.0)
Monocytes Relative: 5 %
Neutro Abs: 5.7 10*3/uL (ref 1.7–7.7)
Neutrophils Relative %: 85 %
Platelets: 154 10*3/uL (ref 150–400)
RBC: 4.56 MIL/uL (ref 4.22–5.81)
RDW: 15 % (ref 11.5–15.5)
WBC: 6.7 10*3/uL (ref 4.0–10.5)
nRBC: 0 % (ref 0.0–0.2)

## 2019-02-10 LAB — COMPREHENSIVE METABOLIC PANEL
ALT: 35 U/L (ref 0–44)
AST: 54 U/L — ABNORMAL HIGH (ref 15–41)
Albumin: 2.9 g/dL — ABNORMAL LOW (ref 3.5–5.0)
Alkaline Phosphatase: 163 U/L — ABNORMAL HIGH (ref 38–126)
Anion gap: 13 (ref 5–15)
BUN: 52 mg/dL — ABNORMAL HIGH (ref 8–23)
CO2: 19 mmol/L — ABNORMAL LOW (ref 22–32)
Calcium: 8.3 mg/dL — ABNORMAL LOW (ref 8.9–10.3)
Chloride: 110 mmol/L (ref 98–111)
Creatinine, Ser: 1.91 mg/dL — ABNORMAL HIGH (ref 0.61–1.24)
GFR calc Af Amer: 38 mL/min — ABNORMAL LOW (ref >60)
GFR calc non Af Amer: 33 mL/min — ABNORMAL LOW (ref >60)
Glucose, Bld: 132 mg/dL — ABNORMAL HIGH (ref 70–99)
Potassium: 4 mmol/L (ref 3.5–5.1)
Sodium: 142 mmol/L (ref 135–145)
Total Bilirubin: 1 mg/dL (ref 0.3–1.2)
Total Protein: 6.7 g/dL (ref 6.5–8.1)

## 2019-02-10 LAB — D-DIMER, QUANTITATIVE: D-Dimer, Quant: 1.15 ug/mL-FEU — ABNORMAL HIGH (ref 0.00–0.50)

## 2019-02-10 LAB — C-REACTIVE PROTEIN: CRP: 2.4 mg/dL — ABNORMAL HIGH (ref <1.0)

## 2019-02-10 LAB — FERRITIN: Ferritin: 233 ng/mL (ref 24–336)

## 2019-02-10 LAB — GLUCOSE, CAPILLARY
Glucose-Capillary: 129 mg/dL — ABNORMAL HIGH (ref 70–99)
Glucose-Capillary: 164 mg/dL — ABNORMAL HIGH (ref 70–99)
Glucose-Capillary: 199 mg/dL — ABNORMAL HIGH (ref 70–99)
Glucose-Capillary: 182 mg/dL — ABNORMAL HIGH (ref 70–99)

## 2019-02-10 LAB — MAGNESIUM: Magnesium: 1.7 mg/dL (ref 1.7–2.4)

## 2019-02-10 MED ORDER — MAGNESIUM SULFATE 2 GM/50ML IV SOLN
2.0000 g | Freq: Once | INTRAVENOUS | Status: AC
  Administered 2019-02-10: 09:00:00 2 g via INTRAVENOUS
  Filled 2019-02-10: qty 50

## 2019-02-10 NOTE — Progress Notes (Signed)
[employer] [place] interpreter used for morning assessment. Pt did not have questions. Confirmed POC for family communication. Attempted to call daughter-in-law for daily update, but the call went unanswered.

## 2019-02-10 NOTE — Evaluation (Signed)
Occupational Therapy Evaluation Patient Details Name: Ryan Burke MRN: [id number] DOB: 08-13-1940 Today's Date: 02/10/2019    History of Present Illness 78 y/o male w/ hx of SOB, sz, mitral regurgitation, HTN, HLD, heart murmur, out, GERD, DM, CKD, chronic bronchitis, chest pain at rest, asthma, aortic regurgitation w/ valave replacements.   Clinical Impression   This 78 y/o male presents with the above. PTA pt reports mod independence with ADL and functional mobility using SPC. Pt requiring minA for functional transfers this session, modA for LB ADL. Pt non-english speaking so difficult to fully assess cognition, though pt requiring max multimodal cues intermittently for following/carrying out instructions. Pt initially on 6-8L HFNC with SpO2 dropping to 78%-low 80s with minimal activity, pt reports minimal SOB. Switched O2 probe to pt earlobe and SpO2 maintaining >92% with remaining activity on 6L  HFNC and good pleth noted, RN made aware. Pt reports he will have family assistance for ADL PRN after discharge. He will benefit from continued acute OT services and currently recommend follow up HHOT services to progress pt towards his PLOF. Will follow. [employer] interpreter utilized, Ryan Burke # [id number].    Follow Up Recommendations  Home health OT;Supervision/Assistance - 24 hour    Equipment Recommendations  3 in 1 bedside commode    Recommendations for Other Services       Precautions / Restrictions Precautions Precautions: Fall Restrictions Weight Bearing Restrictions: No      Mobility Bed Mobility Overal bed mobility: Needs Assistance Bed Mobility: Supine to Sit;Sit to Supine;Rolling Rolling: Supervision   Supine to sit: Min guard Sit to supine: Min guard   General bed mobility comments: minguard for supine<>sitting EOB, RN request pt prone end of session, pt required max mulitmodal cues via interpreter to roll and position himself in sidelying/prone position, no physical assist  required to complete   Transfers Overall transfer level: Needs assistance Equipment used: 1 person hand held assist Transfers: Sit to/from [employer] Sit to Stand: Min assist Stand pivot transfers: Min assist       General transfer comment: steadying assist throughout via face to face method, pt unsteady with attempting to take steps without external assist    Balance Overall balance assessment: Needs assistance Sitting-balance support: Feet supported Sitting balance-Leahy Scale: Fair     Standing balance support: Bilateral upper extremity supported Standing balance-Leahy Scale: Poor Standing balance comment: reliant on external assist                           ADL either performed or assessed with clinical judgement   ADL Overall ADL's : Needs assistance/impaired Eating/Feeding: Modified independent;Sitting Eating/Feeding Details (indicate cue type and reason): pt finishing lunch while sitting up in bed upon arrival Grooming: Set up;Supervision/safety;Sitting;Wash/dry face Grooming Details (indicate cue type and reason): sitting EOB Upper Body Bathing: Min guard;Sitting   Lower Body Bathing: Moderate assistance;Sit to/from stand   Upper Body Dressing : Min guard;Sitting   Lower Body Dressing: Moderate assistance;Sit to/from stand   Toilet Transfer: Minimal Cabin crew Details (indicate cue type and reason): simulated via transfer to/from EOB Toileting- Clothing Manipulation and Hygiene: Moderate assistance;Maximal assistance;Sit to/from stand       Functional mobility during ADLs: Minimal assistance General ADL Comments: pt with impaired balance, ?cognitive impairments, decreased endurance      Vision         Perception     Praxis      Pertinent Vitals/Pain Pain Assessment: No/denies  pain     Hand Dominance     Extremity/Trunk Assessment Upper Extremity Assessment Upper Extremity Assessment:  Generalized weakness   Lower Extremity Assessment Lower Extremity Assessment: Defer to PT evaluation       Communication Communication Communication: Prefers language other than [place];Interpreter utilized;HOH(Nepali)   Cognition Arousal/Alertness: Awake/alert Behavior During Therapy: Flat affect Overall Cognitive Status: Difficult to assess                                 General Comments: pt appeared to have some difficulty responding to some questions, difficulty sequencing/following commands, unsure whether this is due to non-english speaking or cognitive impairments    General Comments       Exercises     Shoulder Instructions      Home Living Family/patient expects to be discharged to:: Private residence Living Arrangements: Spouse/significant other;Children;Other relatives Available Help at Discharge: Family Type of Home: House Home Access: Stairs to enter     Home Layout: Two level Alternate Level Stairs-Number of Steps: flight 13   Bathroom Shower/Tub: Tub/shower unit         Home Equipment: Kasandra Knudsen - single point;Shower seat          Prior Functioning/Environment Level of Independence: Independent with assistive device(s)        Comments: reports use of SPC for mobility        OT Problem List: Decreased strength;Decreased activity tolerance;Decreased range of motion;Impaired balance (sitting and/or standing);Decreased cognition;Decreased safety awareness;Decreased knowledge of use of DME or AE;Cardiopulmonary status limiting activity      OT Treatment/Interventions: Self-care/ADL training;Therapeutic exercise;Energy conservation;DME and/or AE instruction;Therapeutic activities;Cognitive remediation/compensation;Patient/family education;Balance training    OT Goals(Current goals can be found in the care plan section) Acute Rehab OT Goals Patient Stated Goal: none stated, agreeable to working with therapy OT Goal Formulation: With  patient Time For Goal Achievement: 02/24/19 Potential to Achieve Goals: Good  OT Frequency: Min 3X/week   Barriers to D/C:            Co-evaluation              AM-PAC OT "6 Clicks" Daily Activity     Outcome Measure Help from another person eating meals?: None Help from another person taking care of personal grooming?: A Little Help from another person toileting, which includes using toliet, bedpan, or urinal?: A Lot Help from another person bathing (including washing, rinsing, drying)?: A Lot Help from another person to put on and taking off regular upper body clothing?: None Help from another person to put on and taking off regular lower body clothing?: A Lot 6 Click Score: 17   End of Session Equipment Utilized During Treatment: Oxygen Nurse Communication: Mobility status(switch O2 probe to earlobe)  Activity Tolerance: Patient tolerated treatment well Patient left: in bed;with call bell/phone within reach  OT Visit Diagnosis: Unsteadiness on feet (R26.81);Muscle weakness (generalized) (M62.81)                Time: 4742-5956 OT Time Calculation (min): 24 min Charges:  OT General Charges $OT Visit: 1 Visit OT Evaluation $OT Eval Moderate Complexity: 1 Mod OT Treatments $Self Care/Home Management : 8-22 mins  Lou Cal, OT [facility] Services Pager [phone] Office [phone]   Raymondo Band 02/10/2019, 4:31 PM

## 2019-02-10 NOTE — Progress Notes (Signed)
Late Entry for 02/09/19 @ 2047: Patient seen and assessed. Patient resting in bed. No acute distress noted. Nepali interpreter [phone]) utilized to explain plan of care for the evening. Opportunity for questions to be asked and concerns  Voiced. No questions to be asked and no concerns voiced.

## 2019-02-10 NOTE — Progress Notes (Signed)
PROGRESS NOTE                                                                                                                                                                                                             Patient Demographics:    Ryan Burke, is a 78 y.o. male, DOB - 08-Apr-1940, CNO:[id number]  Outpatient Primary MD for the patient is Fleet Contras, MD   Admit date - 02/06/2019   LOS - 4  Chief Complaint  Patient presents with  . Covid positive  . Respiratory Distress  . Fever       Brief Narrative: Patient is a 78 y.o. male with PMHx of HTN, DM-2, CKD stage IIIb, s/p bioprosthetic aortic valve replacement-who presented with gradual worsening shortness of breath for the past 2-3 days, diarrhea-he was subsequently found to have acute hypoxic respiratory failure in the setting of COVID-19 pneumonia.   Subjective:    Ryan Burke today continues to slowly improve-he continues to cough-but is down to 6 L of oxygen today.   Assessment  & Plan :   Acute Hypoxic Resp Failure due to Covid 19 Viral pneumonia: Slow improvement continues-Down to 6 L of oxygen.  Plan is to continue steroids and remdesivir.  Patient is s/p convalescent plasma.  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  Fever: afebrile  O2 requirements: On 6 l/m (10 L/min yesterday)  COVID-19 Labs: Recent Labs    02/08/19 0610 02/09/19 0635 02/10/19 0335  DDIMER 1.02* 0.73* 1.15*  FERRITIN 322 238 233  CRP 5.6* 3.2* 2.4*    Lab Results  Component Value Date   SARSCOV2NAA Detected (A) 01/30/2019     COVID-19 Medications: Steroids:11/9>> Remdesivir: 11/9>> 11/13 Actemra: Will avoid given history of latent tuberculosis. Convalescent Plasma: On 11/11 x 1. Research Studies:N/A  Other medications: Diuretics:Euvolemic-hold Lasix today due to  bump in creatinine. Antibiotics:Not needed as no evidence of bacterial infection- Rocephin/Zithromax d/c'd 11/11  Prone/Incentive Spirometry: encouraged patient to lie prone for 3-4 hours at a time for a total of 16 hours a day, and to encourage incentive spirometry use 3-4/hour.  DVT Prophylaxis  :  Lovenox   Non-anion gap metabolic acidosis: suspect 2/2 CKD/Diarrhea. Improving with supportive care. Beta hydroxy butyrate neg-non gapped acidosis-hence  doubt DKA.  CKD stage IIIb: Slight bump in creatinine today due to diuretic use yesterday-but not very far from baseline-continue to follow.    DM-2 with uncontrolled hyperglycemia (A1c 11.1 on 02/06/2019): CBGs stable-did have hyperglycemia over the past few days due to steroids.  Continue Lantus 20 units, 6 units of NovoLog with meals and SSI.  Follow and adjust.   CBG (last 3)  Recent Labs    02/09/19 1740 02/10/19 0155 02/10/19 0810  GLUCAP 121* 129* 164*   Hypertension: controlled-continue cardura  Dyslipidemia: Continue statin  History of aortic valve replacement with bioprosthetic valve   ABG:    Component Value Date/Time   PHART 7.372 02/06/2019 1848   PCO2ART 21.7 (L) 02/06/2019 1848   PO2ART 98.8 02/06/2019 1848   HCO3 12.3 (L) 02/06/2019 1848   TCO2 25 03/30/2013 1229   ACIDBASEDEF 11.2 (H) 02/06/2019 1848   O2SAT 97.3 02/06/2019 1848    Vent Settings: N/A  Condition -Guarded  Family Communication  : Son over the phone on 11/13  Code Status :  Full Code  Diet :  Diet Order            Diet Carb Modified Fluid consistency: Thin; Room service appropriate? Yes  Diet effective now               Disposition Plan  :  Remain hospitalized-  Barriers to discharge: Hypoxia requiring O2 supplementation/complete 5 days of IV Remdesivir  Consults  :  None  Procedures  :  None  Antibiotics  :    Anti-infectives (From admission, onward)   Start     Dose/Rate Route Frequency Ordered Stop   02/07/19 2000   remdesivir 100 mg in sodium chloride 0.9 % 250 mL IVPB     100 mg 500 mL/hr over 30 Minutes Intravenous Every 24 hours 02/06/19 2216 02/11/19 1559   02/06/19 2245  remdesivir 200 mg in sodium chloride 0.9 % 250 mL IVPB     200 mg 500 mL/hr over 30 Minutes Intravenous Once 02/06/19 2216 02/07/19 0010   02/06/19 2245  cefTRIAXone (ROCEPHIN) 1 g in sodium chloride 0.9 % 100 mL IVPB  Status:  Discontinued     1 g 200 mL/hr over 30 Minutes Intravenous Every 24 hours 02/06/19 2238 02/08/19 1157   02/06/19 2245  azithromycin (ZITHROMAX) 500 mg in sodium chloride 0.9 % 250 mL IVPB  Status:  Discontinued     500 mg 250 mL/hr over 60 Minutes Intravenous Every 24 hours 02/06/19 2238 02/08/19 1157      Inpatient Medications  Scheduled Meds: . sodium chloride   Intravenous Once  . aspirin EC  81 mg Oral Daily  . Chlorhexidine Gluconate Cloth  6 each Topical Daily  . docusate sodium  100 mg Oral BID  . doxazosin  4 mg Oral Daily  . enoxaparin (LOVENOX) injection  40 mg Subcutaneous QHS  . insulin aspart  0-15 Units Subcutaneous TID WC  . insulin aspart  0-5 Units Subcutaneous QHS  . insulin aspart  6 Units Subcutaneous TID WC  . insulin glargine  20 Units Subcutaneous Daily  . mouth rinse  15 mL Mouth Rinse BID  . methylPREDNISolone (SOLU-MEDROL) injection  40 mg Intravenous Q12H  . metoprolol tartrate  25 mg Oral BID  . pantoprazole  40 mg Oral Daily  . pravastatin  20 mg Oral QPM  . sodium bicarbonate  1,300 mg Oral BID  . sodium chloride flush  3 mL Intravenous Q12H  .  sodium chloride flush  3 mL Intravenous Q12H   Continuous Infusions: . sodium chloride    . remdesivir 100 mg in NS 250 mL 100 mg (02/09/19 1641)   PRN Meds:.sodium chloride, acetaminophen, albuterol, bisacodyl, ondansetron **OR** ondansetron (ZOFRAN) IV, oxyCODONE, polyethylene glycol, sodium chloride flush, zolpidem   Time Spent in minutes 25    See all Orders from today for further details   Oren Binet M.D  on 02/10/2019 at 10:46 AM  To page go to [email or web address] - use universal password  [facility] -  Office  [phone]    Objective:   Vitals:   02/10/19 0800 02/10/19 0844 02/10/19 0900 02/10/19 1000  BP:  114/77    Pulse: (!) 53 65 64   Resp: 17  (!) 24 19  Temp: (!) 97.1 F (36.2 C)     TempSrc: Oral     SpO2: 93%  (!) 85%   Weight:      Height:        Wt Readings from Last 3 Encounters:  02/07/19 70.7 kg  08/27/16 71.8 kg  04/20/13 70.1 kg     Intake/Output Summary (Last 24 hours) at 02/10/2019 1046 Last data filed at 02/10/2019 1000 Gross per 24 hour  Intake 1025.5 ml  Output 850 ml  Net 175.5 ml     Physical Exam Gen Exam:Alert awake-not in any distress HEENT:atraumatic, normocephalic Chest: B/L clear to auscultation anteriorly CVS:S1S2 regular Abdomen:soft non tender, non distended Extremities:no edema Neurology: Non focal Skin: no rash   Data Review:    CBC Recent Labs  Lab 02/06/19 1911 02/07/19 0408 02/08/19 0610 02/09/19 0635 02/10/19 0335  WBC 2.7* 2.1* 4.7 5.6 6.7  HGB 11.5* 12.1* 12.7* 12.2* 12.7*  HCT 34.5* 37.3* 38.9* 36.5* 37.9*  PLT 92* 85* 118* 130* 154  MCV 84.8 86.1 85.1 84.3 83.1  MCH 28.3 27.9 27.8 28.2 27.9  MCHC 33.3 32.4 32.6 33.4 33.5  RDW 14.2 14.5 14.9 15.2 15.0  LYMPHSABS 0.6* 0.5* 0.7 0.5* 0.6*  MONOABS 0.2 0.1 0.2 0.2 0.3  EOSABS 0.1 0.0 0.0 0.0 0.0  BASOSABS 0.0 0.0 0.0 0.0 0.0    Chemistries  Recent Labs  Lab 02/06/19 1911 02/07/19 0408 02/08/19 0610 02/09/19 0635 02/10/19 0335  NA 134* 136 139 138 142  K 3.8 5.7* 4.6 4.6 4.0  CL 115* 115* 115* 112* 110  CO2 13* 13* 14* 16* 19*  GLUCOSE 160* 272* 227* 232* 132*  BUN 17 21 34* 44* 52*  CREATININE 1.76* 1.74* 1.81* 1.86* 1.91*  CALCIUM 7.2* 7.1* 8.0* 8.2* 8.3*  MG  --  1.2* 1.7 2.0 1.7  AST 63* 64* 49* 51* 54*  ALT 27 26 26 29  35  ALKPHOS 161* 157* 169* 161* 163*  BILITOT 1.3* 1.2 0.9 1.0 1.0    ------------------------------------------------------------------------------------------------------------------ No results for input(s): CHOL, HDL, LDLCALC, TRIG, CHOLHDL, LDLDIRECT in the last 72 hours.  Lab Results  Component Value Date   HGBA1C 11.1 (H) 02/06/2019   ------------------------------------------------------------------------------------------------------------------ No results for input(s): TSH, T4TOTAL, T3FREE, THYROIDAB in the last 72 hours.  Invalid input(s): FREET3 ------------------------------------------------------------------------------------------------------------------ Recent Labs    02/09/19 0635 02/10/19 0335  FERRITIN 238 233    Coagulation profile No results for input(s): INR, PROTIME in the last 168 hours.  Recent Labs    02/09/19 0635 02/10/19 0335  DDIMER 0.73* 1.15*    Cardiac Enzymes No results for input(s): CKMB, TROPONINI, MYOGLOBIN in the last 168 hours.  Invalid input(s): CK ------------------------------------------------------------------------------------------------------------------    Component Value  Date/Time   BNP 88.0 02/06/2019 1911    Micro Results Recent Results (from the past 240 hour(s))  Blood Culture (routine x 2)     Status: None (Preliminary result)   Collection Time: 02/06/19  7:11 PM   Specimen: BLOOD  Result Value Ref Range Status   Specimen Description   Final    BLOOD RIGHT ANTECUBITAL Performed at [facility], [address] [place], [place] [address]    Special Requests   Final    BOTTLES DRAWN AEROBIC AND ANAEROBIC Blood Culture results may not be optimal due to an excessive volume of blood received in culture bottles Performed at [facility], [address] [place], [place] [address]    Culture   Final    NO GROWTH 2 DAYS Performed at [facility], [address] [place], [place] [address]    Report Status PENDING  Incomplete  Blood  Culture (routine x 2)     Status: None (Preliminary result)   Collection Time: 02/06/19  7:11 PM   Specimen: BLOOD  Result Value Ref Range Status   Specimen Description   Final    BLOOD BLOOD RIGHT HAND Performed at [facility], [address], [place], [place] [address]    Special Requests   Final    BOTTLES DRAWN AEROBIC AND ANAEROBIC Blood Culture adequate volume Performed at [facility], [address], [place], [place] [address]    Culture   Final    NO GROWTH 2 DAYS Performed at [facility], [address], [place], [place] [address]    Report Status PENDING  Incomplete  MRSA PCR Screening     Status: None   Collection Time: 02/07/19  9:15 PM   Specimen: Nasopharyngeal  Result Value Ref Range Status   MRSA by PCR NEGATIVE NEGATIVE Final    Comment:        The GeneXpert MRSA Assay (FDA approved for NASAL specimens only), is one component of a comprehensive MRSA colonization surveillance program. It is not intended to diagnose MRSA infection nor to guide or monitor treatment for MRSA infections. Performed at [facility], [address] [place], [place] [address]     Radiology Reports Dg Chest Port 1 View  Result Date: 02/06/2019 CLINICAL DATA:  Diarrhea and short of breath EXAM: PORTABLE CHEST 1 VIEW COMPARISON:  08/28/2016 FINDINGS: Post sternotomy changes. Diffuse bilateral reticular opacity, suspicious for underlying chronic interstitial lung disease. More confluent ground-glass opacity at the bases and periphery of the right lung. Mild cardiomegaly. No pneumothorax. IMPRESSION: Suspected underlying component of chronic interstitial lung disease. Right greater than left ground-glass opacities and peripheral consolidations, suspect for pneumonia, to include atypical or viral pneumonia. Electronically Signed   By: Jasmine Pang M.D.   On: 02/06/2019 19:50

## 2019-02-11 DIAGNOSIS — J9601 Acute respiratory failure with hypoxia: Secondary | ICD-10-CM | POA: Diagnosis not present

## 2019-02-11 DIAGNOSIS — I10 Essential (primary) hypertension: Secondary | ICD-10-CM | POA: Diagnosis not present

## 2019-02-11 DIAGNOSIS — J069 Acute upper respiratory infection, unspecified: Secondary | ICD-10-CM | POA: Diagnosis not present

## 2019-02-11 DIAGNOSIS — Z952 Presence of prosthetic heart valve: Secondary | ICD-10-CM | POA: Diagnosis not present

## 2019-02-11 DIAGNOSIS — U071 COVID-19: Secondary | ICD-10-CM | POA: Diagnosis not present

## 2019-02-11 LAB — COMPREHENSIVE METABOLIC PANEL
ALT: 37 U/L (ref 0–44)
AST: 55 U/L — ABNORMAL HIGH (ref 15–41)
Albumin: 2.6 g/dL — ABNORMAL LOW (ref 3.5–5.0)
Alkaline Phosphatase: 152 U/L — ABNORMAL HIGH (ref 38–126)
Anion gap: 10 (ref 5–15)
BUN: 57 mg/dL — ABNORMAL HIGH (ref 8–23)
CO2: 20 mmol/L — ABNORMAL LOW (ref 22–32)
Calcium: 8 mg/dL — ABNORMAL LOW (ref 8.9–10.3)
Chloride: 112 mmol/L — ABNORMAL HIGH (ref 98–111)
Creatinine, Ser: 1.71 mg/dL — ABNORMAL HIGH (ref 0.61–1.24)
GFR calc Af Amer: 44 mL/min — ABNORMAL LOW (ref >60)
GFR calc non Af Amer: 38 mL/min — ABNORMAL LOW (ref >60)
Glucose, Bld: 74 mg/dL (ref 70–99)
Potassium: 4.5 mmol/L (ref 3.5–5.1)
Sodium: 142 mmol/L (ref 135–145)
Total Bilirubin: 1 mg/dL (ref 0.3–1.2)
Total Protein: 6 g/dL — ABNORMAL LOW (ref 6.5–8.1)

## 2019-02-11 LAB — CBC WITH DIFFERENTIAL/PLATELET
Abs Immature Granulocytes: 0.06 10*3/uL (ref 0.00–0.07)
Basophils Absolute: 0 10*3/uL (ref 0.0–0.1)
Basophils Relative: 0 %
Eosinophils Absolute: 0 10*3/uL (ref 0.0–0.5)
Eosinophils Relative: 0 %
HCT: 35.6 % — ABNORMAL LOW (ref 39.0–52.0)
Hemoglobin: 11.8 g/dL — ABNORMAL LOW (ref 13.0–17.0)
Immature Granulocytes: 1 %
Lymphocytes Relative: 14 %
Lymphs Abs: 0.7 10*3/uL (ref 0.7–4.0)
MCH: 27.6 pg (ref 26.0–34.0)
MCHC: 33.1 g/dL (ref 30.0–36.0)
MCV: 83.4 fL (ref 80.0–100.0)
Monocytes Absolute: 0.2 10*3/uL (ref 0.1–1.0)
Monocytes Relative: 4 %
Neutro Abs: 4 10*3/uL (ref 1.7–7.7)
Neutrophils Relative %: 81 %
Platelets: 140 10*3/uL — ABNORMAL LOW (ref 150–400)
RBC: 4.27 MIL/uL (ref 4.22–5.81)
RDW: 14.7 % (ref 11.5–15.5)
WBC: 5 10*3/uL (ref 4.0–10.5)
nRBC: 0 % (ref 0.0–0.2)

## 2019-02-11 LAB — C-REACTIVE PROTEIN: CRP: 1.5 mg/dL — ABNORMAL HIGH (ref <1.0)

## 2019-02-11 LAB — FERRITIN: Ferritin: 207 ng/mL (ref 24–336)

## 2019-02-11 LAB — GLUCOSE, CAPILLARY
Glucose-Capillary: 249 mg/dL — ABNORMAL HIGH (ref 70–99)
Glucose-Capillary: 259 mg/dL — ABNORMAL HIGH (ref 70–99)
Glucose-Capillary: 86 mg/dL (ref 70–99)
Glucose-Capillary: 177 mg/dL — ABNORMAL HIGH (ref 70–99)

## 2019-02-11 LAB — D-DIMER, QUANTITATIVE: D-Dimer, Quant: 0.81 ug/mL-FEU — ABNORMAL HIGH (ref 0.00–0.50)

## 2019-02-11 LAB — MAGNESIUM: Magnesium: 2.2 mg/dL (ref 1.7–2.4)

## 2019-02-11 MED ORDER — METHYLPREDNISOLONE SODIUM SUCC 40 MG IJ SOLR
20.0000 mg | Freq: Two times a day (BID) | INTRAMUSCULAR | Status: DC
  Administered 2019-02-11 – 2019-02-12 (×2): 20 mg via INTRAVENOUS
  Filled 2019-02-11 (×2): qty 1

## 2019-02-11 NOTE — Progress Notes (Signed)
PROGRESS NOTE                                                                                                                                                                                                             Patient Demographics:    Ryan Burke, is a 78 y.o. male, DOB - 04/12/1940, ZOX:[id number]  Outpatient Primary MD for the patient is Ryan Burke, Edwin, MD   Admit date - 02/06/2019   LOS - 5  Chief Complaint  Patient presents with  . Covid positive  . Respiratory Distress  . Fever       Brief Narrative: Patient is a 78 y.o. male with PMHx of HTN, DM-2, CKD stage IIIb, s/p bioprosthetic aortic valve replacement-who presented with gradual worsening shortness of breath for the past 2-3 days, diarrhea-he was subsequently found to have acute hypoxic respiratory failure in the setting of COVID-19 pneumonia.   Subjective:    Applied MaterialsChok Burke today feels much better-he was sitting up and eating breakfast when I walked in.  He is now down to 2 L of oxygen.   Assessment  & Plan :   Acute Hypoxic Resp Failure due to Covid 19 Viral pneumonia: Improvement continues-he is down to 2 L of oxygen.  We will start tapering steroids-has finished course of remdesivir, patient is s/p 1 unit of convalescent plasma.  Due to his history of latent tuberculosis-Actemra was avoided.    Fever: afebrile  O2 requirements: On 2 l/m (6 L/min yesterday)  COVID-19 Labs: Recent Labs    02/09/19 0635 02/10/19 0335 02/11/19 0137  DDIMER 0.73* 1.15* 0.81*  FERRITIN 238 233 207  CRP 3.2* 2.4* 1.5*    Lab Results  Component Value Date   SARSCOV2NAA Detected (A) 01/30/2019     COVID-19 Medications: Steroids:11/9>> Remdesivir: 11/9>> 11/13 Actemra: Will avoid given history of latent tuberculosis. Convalescent Plasma: On 11/11 x 1. Research Studies:N/A  Other medications: Diuretics:Euvolemic-no need for Lasix today. Antibiotics:Not  needed as no evidence of bacterial infection- Rocephin/Zithromax d/c'd 11/11  Prone/Incentive Spirometry: encouraged patient to lie prone for 3-4 hours at a time for a total of 16 hours a day, and to encourage incentive spirometry use 3-4/hour.  DVT Prophylaxis  :  Lovenox   Non-anion gap metabolic acidosis: suspect 2/2 CKD/Diarrhea. Improving with supportive care. Beta hydroxy butyrate neg-non gapped acidosis-hence doubt DKA.  CKD stage IIIb: Creatinine remains close to baseline-follow periodically.    DM-2 with uncontrolled hyperglycemia (A1c 11.1 on 02/06/2019): CBGs remain slightly on the higher side-for now would continue with current dosing of Lantus 20 units daily, 6 units of NovoLog with meals and SSI.  Suspect as steroids are being tapered down-insulin requirements will decrease.  CBG (last 3)  Recent Labs    02/10/19 1657 02/11/19 0733 02/11/19 1209  GLUCAP 182* 249* 259*   Hypertension: controlled-continue cardura  Dyslipidemia: Continue statin  History of aortic valve replacement with bioprosthetic valve   ABG:    Component Value Date/Time   PHART 7.372 02/06/2019 1848   PCO2ART 21.7 (L) 02/06/2019 1848   PO2ART 98.8 02/06/2019 1848   HCO3 12.3 (L) 02/06/2019 1848   TCO2 25 03/30/2013 1229   ACIDBASEDEF 11.2 (H) 02/06/2019 1848   O2SAT 97.3 02/06/2019 1848    Vent Settings: N/A  Condition -Guarded  Family Communication  : Son over the phone on 11/14  Code Status :  Full Code  Diet :  Diet Order            Diet Carb Modified Fluid consistency: Thin; Room service appropriate? Yes  Diet effective now               Disposition Plan  :  Remain hospitalized  Barriers to discharge: Hypoxia requiring O2 supplementation  Consults  :  None  Procedures  :  None  Antibiotics  :    Anti-infectives (From admission, onward)   Start     Dose/Rate Route Frequency Ordered Stop   02/07/19 2000  remdesivir 100 mg in sodium chloride 0.9 % 250 mL IVPB      100 mg 500 mL/hr over 30 Minutes Intravenous Every 24 hours 02/06/19 2216 02/10/19 1653   02/06/19 2245  remdesivir 200 mg in sodium chloride 0.9 % 250 mL IVPB     200 mg 500 mL/hr over 30 Minutes Intravenous Once 02/06/19 2216 02/07/19 0010   02/06/19 2245  cefTRIAXone (ROCEPHIN) 1 g in sodium chloride 0.9 % 100 mL IVPB  Status:  Discontinued     1 g 200 mL/hr over 30 Minutes Intravenous Every 24 hours 02/06/19 2238 02/08/19 1157   02/06/19 2245  azithromycin (ZITHROMAX) 500 mg in sodium chloride 0.9 % 250 mL IVPB  Status:  Discontinued     500 mg 250 mL/hr over 60 Minutes Intravenous Every 24 hours 02/06/19 2238 02/08/19 1157      Inpatient Medications  Scheduled Meds: . sodium chloride   Intravenous Once  . aspirin EC  81 mg Oral Daily  . Chlorhexidine Gluconate Cloth  6 each Topical Daily  . docusate sodium  100 mg Oral BID  . doxazosin  4 mg Oral Daily  . enoxaparin (LOVENOX) injection  40 mg Subcutaneous QHS  . insulin aspart  0-15 Units Subcutaneous TID WC  . insulin aspart  0-5 Units Subcutaneous QHS  . insulin aspart  6 Units Subcutaneous TID WC  . insulin glargine  20 Units Subcutaneous Daily  . mouth rinse  15 mL Mouth Rinse BID  . methylPREDNISolone (SOLU-MEDROL) injection  40 mg Intravenous Q12H  . metoprolol tartrate  25 mg Oral BID  . pantoprazole  40 mg Oral Daily  . pravastatin  20 mg Oral QPM  . sodium bicarbonate  1,300 mg Oral BID  . sodium chloride flush  3 mL Intravenous Q12H  . sodium chloride flush  3 mL Intravenous Q12H   Continuous Infusions: .  sodium chloride     PRN Meds:.sodium chloride, acetaminophen, albuterol, bisacodyl, ondansetron **OR** ondansetron (ZOFRAN) IV, oxyCODONE, polyethylene glycol, sodium chloride flush, zolpidem   Time Spent in minutes 25    See all Orders from today for further details   Jeoffrey Massed M.D on 02/11/2019 at 2:04 PM  To page go to [email or web address] - use universal password  [facility] -  Office   [phone]    Objective:   Vitals:   02/11/19 0127 02/11/19 0542 02/11/19 0733 02/11/19 1203  BP: 121/71 113/87 126/67 137/82  Pulse: (!) 56 66 67 62  Resp:  Temp:  (!) 97.4 F (36.3 C) 98.6 F (37 C) 97.8 F (36.6 C)  TempSrc:  Oral Oral Axillary  SpO2:  100% 94% 93%  Weight:      Height:        Wt Readings from Last 3 Encounters:  02/07/19 70.7 kg  08/27/16 71.8 kg  04/20/13 70.1 kg     Intake/Output Summary (Last 24 hours) at 02/11/2019 1404 Last data filed at 02/11/2019 0845 Gross per 24 hour  Intake 253 ml  Output -  Net 253 ml     Physical Exam Gen Exam:Alert awake-not in any distress HEENT:atraumatic, normocephalic Chest: B/L clear to auscultation anteriorly CVS:S1S2 regular Abdomen:soft non tender, non distended Extremities:no edema Neurology: Non focal Skin: no rash   Data Review:    CBC Recent Labs  Lab 02/07/19 0408 02/08/19 0610 02/09/19 0635 02/10/19 0335 02/11/19 0137  WBC 2.1* 4.7 5.6 6.7 5.0  HGB 12.1* 12.7* 12.2* 12.7* 11.8*  HCT 37.3* 38.9* 36.5* 37.9* 35.6*  PLT 85* 118* 130* 154 140*  MCV 86.1 85.1 84.3 83.1 83.4  MCH 27.9 27.8 28.2 27.9 27.6  MCHC 32.4 32.6 33.4 33.5 33.1  RDW 14.5 14.9 15.2 15.0 14.7  LYMPHSABS 0.5* 0.7 0.5* 0.6* 0.7  MONOABS 0.1 0.2 0.2 0.3 0.2  EOSABS 0.0 0.0 0.0 0.0 0.0  BASOSABS 0.0 0.0 0.0 0.0 0.0    Chemistries  Recent Labs  Lab 02/07/19 0408 02/08/19 0610 02/09/19 0635 02/10/19 0335 02/11/19 0137  NA 136 139 138 142 142  K 5.7* 4.6 4.6 4.0 4.5  CL 115* 115* 112* 110 112*  CO2 13* 14* 16* 19* 20*  GLUCOSE 272* 227* 232* 132* 74  BUN 21 34* 44* 52* 57*  CREATININE 1.74* 1.81* 1.86* 1.91* 1.71*  CALCIUM 7.1* 8.0* 8.2* 8.3* 8.0*  MG 1.2* 1.7 2.0 1.7 2.2  AST 64* 49* 51* 54* 55*  ALT 35 37  ALKPHOS 157* 169* 161* 163* 152*  BILITOT 1.2 0.9 1.0 1.0 1.0    ------------------------------------------------------------------------------------------------------------------ No results for input(s): CHOL, HDL, LDLCALC, TRIG, CHOLHDL, LDLDIRECT in the last 72 hours.  Lab Results  Component Value Date   HGBA1C 11.1 (H) 02/06/2019   ------------------------------------------------------------------------------------------------------------------ No results for input(s): TSH, T4TOTAL, T3FREE, THYROIDAB in the last 72 hours.  Invalid input(s): FREET3 ------------------------------------------------------------------------------------------------------------------ Recent Labs    02/10/19 0335 02/11/19 0137  FERRITIN 233 207    Coagulation profile No results for input(s): INR, PROTIME in the last 168 hours.  Recent Labs    02/10/19 0335 02/11/19 0137  DDIMER 1.15* 0.81*    Cardiac Enzymes No results for input(s): CKMB, TROPONINI, MYOGLOBIN in the last 168 hours.  Invalid input(s): CK ------------------------------------------------------------------------------------------------------------------    Component Value Date/Time   BNP 88.0 02/06/2019 1911    Micro Results Recent Results (from the past 240 hour(s))  Blood Culture (routine x 2)  Status: None (Preliminary result)   Collection Time: 02/06/19  7:11 PM   Specimen: BLOOD  Result Value Ref Range Status   Specimen Description   Final    BLOOD RIGHT ANTECUBITAL Performed at [facility], [address] [place], [place] [address]    Special Requests   Final    BOTTLES DRAWN AEROBIC AND ANAEROBIC Blood Culture results may not be optimal due to an excessive volume of blood received in culture bottles Performed at [facility], [address] [place], [place] [address]    Culture   Final    NO GROWTH 3 DAYS Performed at [facility], [address], [place], [place] [address]    Report Status PENDING  Incomplete  Blood  Culture (routine x 2)     Status: None (Preliminary result)   Collection Time: 02/06/19  7:11 PM   Specimen: BLOOD  Result Value Ref Range Status   Specimen Description   Final    BLOOD BLOOD RIGHT HAND Performed at [facility], [address] [place], [place] [address]    Special Requests   Final    BOTTLES DRAWN AEROBIC AND ANAEROBIC Blood Culture adequate volume Performed at [facility], [address] [place], [place] [address]    Culture   Final    NO GROWTH 3 DAYS Performed at [facility], [address], [place], [place] [address]    Report Status PENDING  Incomplete  MRSA PCR Screening     Status: None   Collection Time: 02/07/19  9:15 PM   Specimen: Nasopharyngeal  Result Value Ref Range Status   MRSA by PCR NEGATIVE NEGATIVE Final    Comment:        The GeneXpert MRSA Assay (FDA approved for NASAL specimens only), is one component of a comprehensive MRSA colonization surveillance program. It is not intended to diagnose MRSA infection nor to guide or monitor treatment for MRSA infections. Performed at [facility], [address] [place], [place] [address]     Radiology Reports Dg Chest Port 1 View  Result Date: 02/06/2019 CLINICAL DATA:  Diarrhea and short of breath EXAM: PORTABLE CHEST 1 VIEW COMPARISON:  08/28/2016 FINDINGS: Post sternotomy changes. Diffuse bilateral reticular opacity, suspicious for underlying chronic interstitial lung disease. More confluent ground-glass opacity at the bases and periphery of the right lung. Mild cardiomegaly. No pneumothorax. IMPRESSION: Suspected underlying component of chronic interstitial lung disease. Right greater than left ground-glass opacities and peripheral consolidations, suspect for pneumonia, to include atypical or viral pneumonia. Electronically Signed   By: Jasmine Pang M.D.   On: 02/06/2019 19:50

## 2019-02-11 NOTE — Progress Notes (Addendum)
   02/11/19 1400  Family/Significant Other Communication  Family/Significant Other Update Called (Called son, [name], but no answer)   Update 1415: Called back, updated on POC and patient status.

## 2019-02-11 NOTE — Progress Notes (Signed)
Late Entry for 02/10/19 @ 2018: Patient seen and assessed. Physical assessment completed via computerized charting per [facility] policy. Nepali interpreter [phone] utilized for update on plan of care, discussion of medication regimen. Opportunity for patient to ask questions and voice concerns. No questions asked. No concerns voiced.

## 2019-02-11 NOTE — Plan of Care (Signed)
No acute changes. Interpreter utilized. No concerns or questions voiced from patient.   Problem: Clinical Measurements: Goal: Will remain free from infection Outcome: Progressing Goal: Respiratory complications will improve Outcome: Progressing   Problem: Activity: Goal: Risk for activity intolerance will decrease Outcome: Progressing   Problem: Nutrition: Goal: Adequate nutrition will be maintained Outcome: Progressing   Problem: Elimination: Goal: Will not experience complications related to bowel motility Outcome: Progressing   Problem: Safety: Goal: Ability to remain free from injury will improve Outcome: Progressing   Problem: Skin Integrity: Goal: Risk for impaired skin integrity will decrease Outcome: Progressing   Problem: Education: Goal: Knowledge of risk factors and measures for prevention of condition will improve Outcome: Progressing   Problem: Coping: Goal: Psychosocial and spiritual needs will be supported Outcome: Progressing   Problem: Respiratory: Goal: Will maintain a patent airway Outcome: Progressing Goal: Complications related to the disease process, condition or treatment will be avoided or minimized Outcome: Progressing

## 2019-02-11 NOTE — Progress Notes (Signed)
Physical Therapy Treatment Patient Details Name: Ryan Burke MRN: [id number] DOB: September 13, 1940 Today's Date: 02/11/2019    History of Present Illness 78 y/o male w/ hx of SOB, sz, mitral regurgitation, HTN, HLD, heart murmur, out, GERD, DM, CKD, chronic bronchitis, chest pain at rest, asthma, aortic regurgitation w/ valave replacements.    PT Comments    Pt is making some progress with mobility and also activity tolerance. Cognition still remains a mystery as he tends to not understand some instructions in English and also his mother tongue. Functionally is able to tolerate more mobility with decreased assistance, able to ambulate approx 115ft w/ min-mod a, has some difficulty with walker use, especially with turns as therapist has to educate and guide w/o interpreter. Overall did much better. Pt was on 4L/min via [place] this pm and managed to maintain sats in 80s with ambulation, but after some distance sats dropped to 70s, pt returned to room and recliner and sats stayed in 70s, pt cues to take deep breaths and attempt pursed lip breathing, minimally effective. Sats were monitored on pedi finger probe once changed to nelcor finger probe sats in 80s and 90s very quickly.     Follow Up Recommendations  Home health PT     Equipment Recommendations  Rolling walker with 5" wheels    Recommendations for Other Services       Precautions / Restrictions Precautions Precautions: Fall Precaution Comments: monitor sats w/ activity Restrictions Weight Bearing Restrictions: No    Mobility  Bed Mobility Overal bed mobility: Needs Assistance Bed Mobility: Supine to Sit Rolling: Supervision   Supine to sit: Min guard        Transfers Overall transfer level: Needs assistance Equipment used: Rolling walker (2 wheeled) Transfers: Sit to/from Stand Sit to Stand: Min assist         General transfer comment: attempted to educate on proper hand placement with sit<>stand and walker but  difficult to understand  Ambulation/Gait Ambulation/Gait assistance: Min assist;Mod assist Gait Distance (Feet): 150 Feet Assistive device: Rolling walker (2 wheeled) Gait Pattern/deviations: Step-to pattern;Wide base of support;Shuffle     General Gait Details: was able to ambulate with walker but difficult to interpret and guide at same time, attempted to give tactile cues with ambulation and was quite effective, pt has some difficulty with turns and understanding proper foot placement w/ turns. will need to continue to address this. w. ambulation was doing well with 02 sats in 80s but w/ increased distance noted to decrease to 70s, upon return to recliner changed probe to finger probe and sats reading in high 80s and into 90s very quickly pt remained on 4L/min [place].   Stairs             Wheelchair Mobility    Modified Rankin (Stroke Patients Only)       Balance Overall balance assessment: Needs assistance Sitting-balance support: Feet supported;Bilateral upper extremity supported Sitting balance-Leahy Scale: Fair     Standing balance support: Bilateral upper extremity supported;During functional activity Standing balance-Leahy Scale: Fair Standing balance comment: does better with balance and ambulation this session                            Cognition Arousal/Alertness: Lethargic Behavior During Therapy: Flat affect Overall Cognitive Status: No family/caregiver present to determine baseline cognitive functioning  Exercises      General Comments        Pertinent Vitals/Pain Pain Assessment: No/denies pain    Home Living                      Prior Function            PT Goals (current goals can now be found in the care plan section) Acute Rehab PT Goals Patient Stated Goal: unable to state at this time Time For Goal Achievement: 02/23/19 Potential to Achieve Goals: Good Progress  towards PT goals: Progressing toward goals    Frequency    Min 3X/week      PT Plan Current plan remains appropriate    Co-evaluation              AM-PAC PT "6 Clicks" Mobility   Outcome Measure  Help needed turning from your back to your side while in a flat bed without using bedrails?: A Little Help needed moving from lying on your back to sitting on the side of a flat bed without using bedrails?: A Little Help needed moving to and from a bed to a chair (including a wheelchair)?: A Little Help needed standing up from a chair using your arms (e.g., wheelchair or bedside chair)?: A Little Help needed to walk in hospital room?: A Lot Help needed climbing 3-5 steps with a railing? : A Lot 6 Click Score: 16    End of Session Equipment Utilized During Treatment: Oxygen Activity Tolerance: Patient limited by fatigue;Patient limited by lethargy Patient left: in chair;with call bell/phone within reach Nurse Communication: Mobility status PT Visit Diagnosis: Unsteadiness on feet (R26.81);Other abnormalities of gait and mobility (R26.89);Muscle weakness (generalized) (M62.81)     Time: 5537-4827 PT Time Calculation (min) (ACUTE ONLY): 25 min  Charges:  $Gait Training: 8-22 mins $Therapeutic Activity: 8-22 mins                     Ryan Burke, PT    Delford Field 02/11/2019, 4:34 PM

## 2019-02-12 DIAGNOSIS — U071 COVID-19: Secondary | ICD-10-CM | POA: Diagnosis not present

## 2019-02-12 DIAGNOSIS — J9601 Acute respiratory failure with hypoxia: Secondary | ICD-10-CM | POA: Diagnosis not present

## 2019-02-12 DIAGNOSIS — I10 Essential (primary) hypertension: Secondary | ICD-10-CM | POA: Diagnosis not present

## 2019-02-12 DIAGNOSIS — J069 Acute upper respiratory infection, unspecified: Secondary | ICD-10-CM | POA: Diagnosis not present

## 2019-02-12 DIAGNOSIS — Z952 Presence of prosthetic heart valve: Secondary | ICD-10-CM | POA: Diagnosis not present

## 2019-02-12 LAB — CULTURE, BLOOD (ROUTINE X 2)
Culture: NO GROWTH
Culture: NO GROWTH
Special Requests: ADEQUATE

## 2019-02-12 LAB — CBC WITH DIFFERENTIAL/PLATELET
Abs Immature Granulocytes: 0.15 10*3/uL — ABNORMAL HIGH (ref 0.00–0.07)
Basophils Absolute: 0 10*3/uL (ref 0.0–0.1)
Basophils Relative: 0 %
Eosinophils Absolute: 0 10*3/uL (ref 0.0–0.5)
Eosinophils Relative: 0 %
HCT: 37.8 % — ABNORMAL LOW (ref 39.0–52.0)
Hemoglobin: 12.2 g/dL — ABNORMAL LOW (ref 13.0–17.0)
Immature Granulocytes: 3 %
Lymphocytes Relative: 13 %
Lymphs Abs: 0.6 10*3/uL — ABNORMAL LOW (ref 0.7–4.0)
MCH: 27.2 pg (ref 26.0–34.0)
MCHC: 32.3 g/dL (ref 30.0–36.0)
MCV: 84.2 fL (ref 80.0–100.0)
Monocytes Absolute: 0.2 10*3/uL (ref 0.1–1.0)
Monocytes Relative: 4 %
Neutro Abs: 4 10*3/uL (ref 1.7–7.7)
Neutrophils Relative %: 80 %
Platelets: 137 10*3/uL — ABNORMAL LOW (ref 150–400)
RBC: 4.49 MIL/uL (ref 4.22–5.81)
RDW: 14.8 % (ref 11.5–15.5)
WBC: 5 10*3/uL (ref 4.0–10.5)
nRBC: 0 % (ref 0.0–0.2)

## 2019-02-12 LAB — D-DIMER, QUANTITATIVE: D-Dimer, Quant: 0.8 ug/mL-FEU — ABNORMAL HIGH (ref 0.00–0.50)

## 2019-02-12 LAB — COMPREHENSIVE METABOLIC PANEL
ALT: 45 U/L — ABNORMAL HIGH (ref 0–44)
AST: 60 U/L — ABNORMAL HIGH (ref 15–41)
Albumin: 2.7 g/dL — ABNORMAL LOW (ref 3.5–5.0)
Alkaline Phosphatase: 152 U/L — ABNORMAL HIGH (ref 38–126)
Anion gap: 10 (ref 5–15)
BUN: 52 mg/dL — ABNORMAL HIGH (ref 8–23)
CO2: 21 mmol/L — ABNORMAL LOW (ref 22–32)
Calcium: 8.2 mg/dL — ABNORMAL LOW (ref 8.9–10.3)
Chloride: 110 mmol/L (ref 98–111)
Creatinine, Ser: 1.42 mg/dL — ABNORMAL HIGH (ref 0.61–1.24)
GFR calc Af Amer: 55 mL/min — ABNORMAL LOW (ref >60)
GFR calc non Af Amer: 47 mL/min — ABNORMAL LOW (ref >60)
Glucose, Bld: 113 mg/dL — ABNORMAL HIGH (ref 70–99)
Potassium: 4.6 mmol/L (ref 3.5–5.1)
Sodium: 141 mmol/L (ref 135–145)
Total Bilirubin: 1.3 mg/dL — ABNORMAL HIGH (ref 0.3–1.2)
Total Protein: 6 g/dL — ABNORMAL LOW (ref 6.5–8.1)

## 2019-02-12 LAB — GLUCOSE, CAPILLARY
Glucose-Capillary: 120 mg/dL — ABNORMAL HIGH (ref 70–99)
Glucose-Capillary: 210 mg/dL — ABNORMAL HIGH (ref 70–99)
Glucose-Capillary: 267 mg/dL — ABNORMAL HIGH (ref 70–99)
Glucose-Capillary: 294 mg/dL — ABNORMAL HIGH (ref 70–99)

## 2019-02-12 LAB — MAGNESIUM: Magnesium: 2 mg/dL (ref 1.7–2.4)

## 2019-02-12 LAB — FERRITIN: Ferritin: 196 ng/mL (ref 24–336)

## 2019-02-12 LAB — C-REACTIVE PROTEIN: CRP: 1 mg/dL — ABNORMAL HIGH (ref <1.0)

## 2019-02-12 MED ORDER — PREDNISONE 20 MG PO TABS
30.0000 mg | ORAL_TABLET | Freq: Every day | ORAL | Status: DC
  Administered 2019-02-13 – 2019-02-14 (×2): 30 mg via ORAL
  Filled 2019-02-12 (×2): qty 1

## 2019-02-12 MED ORDER — INSULIN GLARGINE 100 UNIT/ML ~~LOC~~ SOLN
12.0000 [IU] | Freq: Every day | SUBCUTANEOUS | Status: DC
  Filled 2019-02-12: qty 0.12

## 2019-02-12 NOTE — TOC Initial Note (Signed)
Transition of Care [facility]) - Initial/Assessment Note    Patient Details  Name: Ryan Burke MRN: [id number] Date of Birth: 08-Apr-1940  Transition of Care [facility] CM/SW Contact:    Maryclare Labrador, RN Phone Number: 02/12/2019, 3:52 PM  Clinical Narrative:     CM utilized interpretor services for communication with pt, pt speaks [place].  Pt confirms that he lives with his son and daughter in law - pt was independent with ambulation prior to admit.  Pt will need to tranpsort home via PTAR as family will not be able to pick him up at discharge - CM confirmed home address.  Pt is interested in [facility] and DME as ordered.  CM provided pt choice however pt did not have preference.  Pt in agreement for CM to speak with his son/daughter in law.  CM provided HH/DME choice per medicare.gov - [facility] and [facility] chosen - both agencies contacted and referrals accepted. Pt is interested in CM setting up PCP follow up appt at discharge and declined barriers with cost of medcations  On day of discharge pt will need to be given portable oxygen tank, RW and 3:1 from [employer].                Expected Discharge Plan: [place] Barriers to Discharge: Continued Medical Work up   Patient Goals and CMS Choice   CMS Medicare.gov Compare Post Acute Care list provided to:: Patient Choice offered to / list presented to : Patient  Expected Discharge Plan and Services Expected Discharge Plan: Rule       Living arrangements for the past 2 months: Single Family Home                 DME Arranged: Oxygen DME Agency: [place] Date DME Agency Contacted: 02/12/19 Time DME Agency Contacted: [phone] Representative spoke with at DME Agency: Jeneen Rinks (covering for [place]) [place] Arranged: RN, PT, OT HH Agency: [place] Date [facility] Agency Contacted: 02/12/19 Time [place]: [place] Representative spoke with at [place]: Tommi Rumps  Prior Living  Arrangements/Services Living arrangements for the past 2 months: [place] with:: Adult Children(Lives with son and daughter in law) Patient language and need for interpreter reviewed:: Yes Do you feel safe going back to the place where you live?: Yes      Need for Family Participation in Patient Care: Yes (Comment) Care giver support system in place?: Yes (comment) Current home services: DME(walker (not rolling), and cane)    Activities of Daily Living Home Assistive Devices/Equipment: Cane (specify quad or straight), Walker (specify type), CBG Meter(single point cane, front wheeled walker) ADL Screening (condition at time of admission) Patient's cognitive ability adequate to safely complete daily activities?: Yes Is the patient deaf or have difficulty hearing?: No Does the patient have difficulty seeing, even when wearing glasses/contacts?: No Does the patient have difficulty concentrating, remembering, or making decisions?: No Patient able to express need for assistance with ADLs?: Yes Does the patient have difficulty dressing or bathing?: Yes Independently performs ADLs?: No Communication: Independent Dressing (OT): Needs assistance Is this a change from baseline?: Change from baseline, expected to last >3 days Grooming: Needs assistance Is this a change from baseline?: Change from baseline, expected to last >3 days Feeding: Needs assistance Is this a change from baseline?: Change from baseline, expected to last >3 days Bathing: Needs assistance Is this a change from baseline?: Change from baseline, expected to  last >3 days Toileting: Needs assistance Is this a change from baseline?: Change from baseline, expected to last >3days In/Out Bed: Needs assistance Is this a change from baseline?: Change from baseline, expected to last >3 days Walks in Home: Needs assistance Is this a change from baseline?: Change from baseline, expected to last >3 days Does the patient  have difficulty walking or climbing stairs?: Yes(secondary to wekaness) Weakness of Legs: Both Weakness of Arms/Hands: Both  Permission Sought/Granted         Permission granted to share info w AGENCY: [facility] and [employer]  Permission granted to share info w Relationship: Daughter in Social worker     Emotional Assessment   Attitude/Demeanor/Rapport: Gracious, Engaged Affect (typically observed): Accepting Orientation: : Oriented to Self, Oriented to Place, Oriented to  Time, Oriented to Situation   Psych Involvement: No (comment)  Admission diagnosis:  Acute respiratory failure with hypoxia (HCC) [J96.01] COVID-19 [U07.1] Patient Active Problem List   Diagnosis Date Noted  . Acute respiratory disease due to COVID-19 virus 02/06/2019  . Acute bronchitis 08/27/2016  . Seizures (HCC) 04/19/2013  . Gout 04/19/2013  . S/P AVR (aortic valve replacement) 05/08/2012  . Atrial fibrillation (HCC) 05/08/2012  . Hyperlipidemia   . Aortic regurgitation   . Mitral regurgitation   . ELEVATED PROSTATE SPECIFIC ANTIGEN 09/24/2009  . PROTEINURIA 09/20/2009  . AORTIC INSUFFICIENCY, MODERATE 08/02/2009  . HYPERGLYCEMIA 07/24/2009  . LIVER FUNCTION TESTS, ABNORMAL, HX OF 07/24/2009  . Essential hypertension, benign 07/22/2009   PCP:  Fleet Contras, MD Pharmacy:   [facility] [phone] Ginette Otto, [place] - [address] AT [facility] [address] [facility] [address] [place] [address] Phone: [phone] Fax: [phone]     Social Determinants of Health (SDOH) Interventions    Readmission Risk Interventions No flowsheet data found.

## 2019-02-12 NOTE — Progress Notes (Addendum)
PROGRESS NOTE                                                                                                                                                                                                             Patient Demographics:    Ryan Burke, is a 78 y.o. male, DOB - Aug 28, 1940, ZOX:[id number]  Outpatient Primary MD for the patient is Fleet Contras, MD   Admit date - 02/06/2019   LOS - 6  Chief Complaint  Patient presents with   Covid positive   Respiratory Distress   Fever       Brief Narrative: Patient is a 78 y.o. male with PMHx of HTN, DM-2, CKD stage IIIb, s/p bioprosthetic aortic valve replacement-who presented with gradual worsening shortness of breath for the past 2-3 days, diarrhea-he was subsequently found to have acute hypoxic respiratory failure in the setting of COVID-19 pneumonia.   Subjective:    Kairos Rackham continues to slowly improve-he is on 2-3 L of oxygen this morning.   Assessment  & Plan :   Acute Hypoxic Resp Failure due to Covid 19 Viral pneumonia: Slow improvement continues-continues to desaturate down to the 80s when he walks-requires approximately 4 L with ambulation-at rest he is stable on 2-3 L.  Continue tapering steroids-has finished a course of remdesivir, patient is also s/p 1 unit of convalescent plasma.  Due to his history of latent tuberculosis-Actemra was avoided.  Suspect will require home O2 on discharge.  Fever: afebrile  O2 requirements: On 2 l/m  COVID-19 Labs: Recent Labs    02/10/19 0335 02/11/19 0137 02/12/19 0420  DDIMER 1.15* 0.81* 0.80*  FERRITIN 233 207 196  CRP 2.4* 1.5* 1.0*    Lab Results  Component Value Date   SARSCOV2NAA Detected (A) 01/30/2019     COVID-19 Medications: Steroids:11/9>> Remdesivir: 11/9>> 11/13 Actemra: Will avoid given history of latent tuberculosis. Convalescent Plasma: On 11/11 x 1. Research Studies:N/A  Other  medications: Diuretics:Euvolemic-no need for Lasix today. Antibiotics:Not needed as no evidence of bacterial infection- Rocephin/Zithromax d/c'd 11/11  Prone/Incentive Spirometry: encouraged patient to lie prone for 3-4 hours at a time for a total of 16 hours a day, and to encourage incentive spirometry use 3-4/hour.  DVT Prophylaxis  :  Lovenox   Non-anion gap metabolic acidosis: suspect 2/2 CKD/Diarrhea. Improving with supportive care. Beta hydroxy  butyrate neg-non gapped acidosis-hence doubt DKA.  CKD stage IIIb: Creatinine remains close to baseline-follow periodically.    DM-2 with uncontrolled hyperglycemia (A1c 11.1 on 02/06/2019): CBGs better controlled-suspect at risk of hypoglycemia as steroid doses have been tapered down significantly-drop Lantus down to 12 units, stop scheduled premeal NovoLog-continue with SSI.  Follow and adjust.  CBG (last 3)  Recent Labs    02/11/19 1639 02/11/19 2040 02/12/19 0734  GLUCAP 86 177* 120*   Hypertension: controlled-continue cardura  Dyslipidemia: Continue statin  History of aortic valve replacement with bioprosthetic valve   ABG:    Component Value Date/Time   PHART 7.372 02/06/2019 1848   PCO2ART 21.7 (L) 02/06/2019 1848   PO2ART 98.8 02/06/2019 1848   HCO3 12.3 (L) 02/06/2019 1848   TCO2 25 03/30/2013 1229   ACIDBASEDEF 11.2 (H) 02/06/2019 1848   O2SAT 97.3 02/06/2019 1848    Vent Settings: N/A  Condition -Guarded  Family Communication  : Son over the phone on 11/15  Code Status :  Full Code  Diet :  Diet Order            Diet Carb Modified Fluid consistency: Thin; Room service appropriate? Yes  Diet effective now               Disposition Plan  :  Remain hospitalized-tentatively home with home health in the next 1-2 days depending on clinical progress.  Barriers to discharge: Hypoxia requiring O2 supplementation  Consults  :  None  Procedures  :  None  Antibiotics  :    Anti-infectives (From  admission, onward)   Start     Dose/Rate Route Frequency Ordered Stop   02/07/19 2000  remdesivir 100 mg in sodium chloride 0.9 % 250 mL IVPB     100 mg 500 mL/hr over 30 Minutes Intravenous Every 24 hours 02/06/19 2216 02/10/19 1653   02/06/19 2245  remdesivir 200 mg in sodium chloride 0.9 % 250 mL IVPB     200 mg 500 mL/hr over 30 Minutes Intravenous Once 02/06/19 2216 02/07/19 0010   02/06/19 2245  cefTRIAXone (ROCEPHIN) 1 g in sodium chloride 0.9 % 100 mL IVPB  Status:  Discontinued     1 g 200 mL/hr over 30 Minutes Intravenous Every 24 hours 02/06/19 2238 02/08/19 1157   02/06/19 2245  azithromycin (ZITHROMAX) 500 mg in sodium chloride 0.9 % 250 mL IVPB  Status:  Discontinued     500 mg 250 mL/hr over 60 Minutes Intravenous Every 24 hours 02/06/19 2238 02/08/19 1157      Inpatient Medications  Scheduled Meds:  sodium chloride   Intravenous Once   aspirin EC  81 mg Oral Daily   Chlorhexidine Gluconate Cloth  6 each Topical Daily   docusate sodium  100 mg Oral BID   doxazosin  4 mg Oral Daily   enoxaparin (LOVENOX) injection  40 mg Subcutaneous QHS   insulin aspart  0-15 Units Subcutaneous TID WC   insulin aspart  0-5 Units Subcutaneous QHS   insulin aspart  6 Units Subcutaneous TID WC   insulin glargine  20 Units Subcutaneous Daily   mouth rinse  15 mL Mouth Rinse BID   methylPREDNISolone (SOLU-MEDROL) injection  20 mg Intravenous Q12H   metoprolol tartrate  25 mg Oral BID   pantoprazole  40 mg Oral Daily   pravastatin  20 mg Oral QPM   sodium bicarbonate  1,300 mg Oral BID   sodium chloride flush  3 mL Intravenous Q12H  sodium chloride flush  3 mL Intravenous Q12H   Continuous Infusions:  sodium chloride     PRN Meds:.sodium chloride, acetaminophen, albuterol, bisacodyl, ondansetron **OR** ondansetron (ZOFRAN) IV, oxyCODONE, polyethylene glycol, sodium chloride flush, zolpidem   Time Spent in minutes 25   See all Orders from today for further  details   Jeoffrey Massed M.D on 02/12/2019 at 11:08 AM  To page go to [email or web address] - use universal password  [facility] -  Office  [phone]    Objective:   Vitals:   02/11/19 2006 02/11/19 2008 02/12/19 0439 02/12/19 0736  BP:   136/77 130/76  Pulse: 71 63 (!) 51 66  Resp: (!) 22 (!) 23 18 16   Temp:   97.8 F (36.6 C) 97.8 F (36.6 C)  TempSrc:   Oral Oral  SpO2: (!) 88% 90% 99% 92%  Weight:      Height:        Wt Readings from Last 3 Encounters:  02/07/19 70.7 kg  08/27/16 71.8 kg  04/20/13 70.1 kg     Intake/Output Summary (Last 24 hours) at 02/12/2019 1108 Last data filed at 02/12/2019 0902 Gross per 24 hour  Intake 123 ml  Output --  Net 123 ml     Physical Exam Gen Exam:Alert awake-not in any distress HEENT:atraumatic, normocephalic Chest: B/L clear to auscultation anteriorly CVS:S1S2 regular Abdomen:soft non tender, non distended Extremities:no edema Neurology: Non focal Skin: no rash   Data Review:    CBC Recent Labs  Lab 02/08/19 0610 02/09/19 0635 02/10/19 0335 02/11/19 0137 02/12/19 0420  WBC 4.7 5.6 6.7 5.0 5.0  HGB 12.7* 12.2* 12.7* 11.8* 12.2*  HCT 38.9* 36.5* 37.9* 35.6* 37.8*  PLT 118* 130* 154 140* 137*  MCV 85.1 84.3 83.1 83.4 84.2  MCH 27.8 28.2 27.9 27.6 27.2  MCHC 32.6 33.4 33.5 33.1 32.3  RDW 14.9 15.2 15.0 14.7 14.8  LYMPHSABS 0.7 0.5* 0.6* 0.7 0.6*  MONOABS 0.2 0.2 0.3 0.2 0.2  EOSABS 0.0 0.0 0.0 0.0 0.0  BASOSABS 0.0 0.0 0.0 0.0 0.0    Chemistries  Recent Labs  Lab 02/08/19 0610 02/09/19 0635 02/10/19 0335 02/11/19 0137 02/12/19 0420  NA 139 138 142 142 141  K 4.6 4.6 4.0 4.5 4.6  CL 115* 112* 110 112* 110  CO2 14* 16* 19* 20* 21*  GLUCOSE 227* 232* 132* 74 113*  BUN 34* 44* 52* 57* 52*  CREATININE 1.81* 1.86* 1.91* 1.71* 1.42*  CALCIUM 8.0* 8.2* 8.3* 8.0* 8.2*  MG 1.7 2.0 1.7 2.2 2.0  AST 49* 51* 54* 55* 60*  ALT 26 29 35 37 45*  ALKPHOS 169* 161* 163* 152* 152*  BILITOT 0.9 1.0 1.0  1.0 1.3*   ------------------------------------------------------------------------------------------------------------------ No results for input(s): CHOL, HDL, LDLCALC, TRIG, CHOLHDL, LDLDIRECT in the last 72 hours.  Lab Results  Component Value Date   HGBA1C 11.1 (H) 02/06/2019   ------------------------------------------------------------------------------------------------------------------ No results for input(s): TSH, T4TOTAL, T3FREE, THYROIDAB in the last 72 hours.  Invalid input(s): FREET3 ------------------------------------------------------------------------------------------------------------------ Recent Labs    02/11/19 0137 02/12/19 0420  FERRITIN 207 196    Coagulation profile No results for input(s): INR, PROTIME in the last 168 hours.  Recent Labs    02/11/19 0137 02/12/19 0420  DDIMER 0.81* 0.80*    Cardiac Enzymes No results for input(s): CKMB, TROPONINI, MYOGLOBIN in the last 168 hours.  Invalid input(s): CK ------------------------------------------------------------------------------------------------------------------    Component Value Date/Time   BNP 88.0 02/06/2019 1911    Micro Results Recent Results (  from the past 240 hour(s))  Blood Culture (routine x 2)     Status: None (Preliminary result)   Collection Time: 02/06/19  7:11 PM   Specimen: BLOOD  Result Value Ref Range Status   Specimen Description   Final    BLOOD RIGHT ANTECUBITAL Performed at [facility] [address], [place], [place] [address]    Special Requests   Final    BOTTLES DRAWN AEROBIC AND ANAEROBIC Blood Culture results may not be optimal due to an excessive volume of blood received in culture bottles Performed at [facility] [address], [place], [place] [address]    Culture   Final    NO GROWTH 4 DAYS Performed at [facility], [place] [address] [place], [place] [address]    Report Status PENDING  Incomplete    Blood Culture (routine x 2)     Status: None (Preliminary result)   Collection Time: 02/06/19  7:11 PM   Specimen: BLOOD  Result Value Ref Range Status   Specimen Description   Final    BLOOD BLOOD RIGHT HAND Performed at [facility] [address] [place], [place] [address]    Special Requests   Final    BOTTLES DRAWN AEROBIC AND ANAEROBIC Blood Culture adequate volume Performed at [facility] [address], [place], [place] [address]    Culture   Final    NO GROWTH 4 DAYS Performed at [facility], [place] [address], [place], [place] [address]    Report Status PENDING  Incomplete  MRSA PCR Screening     Status: None   Collection Time: 02/07/19  9:15 PM   Specimen: Nasopharyngeal  Result Value Ref Range Status   MRSA by PCR NEGATIVE NEGATIVE Final    Comment:        The GeneXpert MRSA Assay (FDA approved for NASAL specimens only), is one component of a comprehensive MRSA colonization surveillance program. It is not intended to diagnose MRSA infection nor to guide or monitor treatment for MRSA infections. Performed at [facility], [place] [address], [place], [place] [address]     Radiology Reports Dg Chest Port 1 View  Result Date: 02/06/2019 CLINICAL DATA:  Diarrhea and short of breath EXAM: PORTABLE CHEST 1 VIEW COMPARISON:  08/28/2016 FINDINGS: Post sternotomy changes. Diffuse bilateral reticular opacity, suspicious for underlying chronic interstitial lung disease. More confluent ground-glass opacity at the bases and periphery of the right lung. Mild cardiomegaly. No pneumothorax. IMPRESSION: Suspected underlying component of chronic interstitial lung disease. Right greater than left ground-glass opacities and peripheral consolidations, suspect for pneumonia, to include atypical or viral pneumonia. Electronically Signed   By: Donavan Foil M.D.   On: 02/06/2019 19:50

## 2019-02-13 DIAGNOSIS — J069 Acute upper respiratory infection, unspecified: Secondary | ICD-10-CM | POA: Diagnosis not present

## 2019-02-13 DIAGNOSIS — J9601 Acute respiratory failure with hypoxia: Secondary | ICD-10-CM | POA: Diagnosis not present

## 2019-02-13 DIAGNOSIS — U071 COVID-19: Secondary | ICD-10-CM | POA: Diagnosis not present

## 2019-02-13 DIAGNOSIS — I10 Essential (primary) hypertension: Secondary | ICD-10-CM | POA: Diagnosis not present

## 2019-02-13 DIAGNOSIS — Z952 Presence of prosthetic heart valve: Secondary | ICD-10-CM | POA: Diagnosis not present

## 2019-02-13 LAB — CBC WITH DIFFERENTIAL/PLATELET
Abs Immature Granulocytes: 0.3 10*3/uL — ABNORMAL HIGH (ref 0.00–0.07)
Basophils Absolute: 0 10*3/uL (ref 0.0–0.1)
Basophils Relative: 1 %
Eosinophils Absolute: 0 10*3/uL (ref 0.0–0.5)
Eosinophils Relative: 1 %
HCT: 40.2 % (ref 39.0–52.0)
Hemoglobin: 13 g/dL (ref 13.0–17.0)
Immature Granulocytes: 5 %
Lymphocytes Relative: 13 %
Lymphs Abs: 0.9 10*3/uL (ref 0.7–4.0)
MCH: 27.6 pg (ref 26.0–34.0)
MCHC: 32.3 g/dL (ref 30.0–36.0)
MCV: 85.4 fL (ref 80.0–100.0)
Monocytes Absolute: 0.4 10*3/uL (ref 0.1–1.0)
Monocytes Relative: 7 %
Neutro Abs: 5 10*3/uL (ref 1.7–7.7)
Neutrophils Relative %: 73 %
Platelets: 151 10*3/uL (ref 150–400)
RBC: 4.71 MIL/uL (ref 4.22–5.81)
RDW: 14.4 % (ref 11.5–15.5)
WBC: 6.7 10*3/uL (ref 4.0–10.5)
nRBC: 0.3 % — ABNORMAL HIGH (ref 0.0–0.2)

## 2019-02-13 LAB — COMPREHENSIVE METABOLIC PANEL
ALT: 53 U/L — ABNORMAL HIGH (ref 0–44)
AST: 75 U/L — ABNORMAL HIGH (ref 15–41)
Albumin: 2.8 g/dL — ABNORMAL LOW (ref 3.5–5.0)
Alkaline Phosphatase: 159 U/L — ABNORMAL HIGH (ref 38–126)
Anion gap: 9 (ref 5–15)
BUN: 51 mg/dL — ABNORMAL HIGH (ref 8–23)
CO2: 24 mmol/L (ref 22–32)
Calcium: 8.3 mg/dL — ABNORMAL LOW (ref 8.9–10.3)
Chloride: 108 mmol/L (ref 98–111)
Creatinine, Ser: 1.41 mg/dL — ABNORMAL HIGH (ref 0.61–1.24)
GFR calc Af Amer: 55 mL/min — ABNORMAL LOW (ref >60)
GFR calc non Af Amer: 48 mL/min — ABNORMAL LOW (ref >60)
Glucose, Bld: 51 mg/dL — ABNORMAL LOW (ref 70–99)
Potassium: 4 mmol/L (ref 3.5–5.1)
Sodium: 141 mmol/L (ref 135–145)
Total Bilirubin: 1.7 mg/dL — ABNORMAL HIGH (ref 0.3–1.2)
Total Protein: 6.1 g/dL — ABNORMAL LOW (ref 6.5–8.1)

## 2019-02-13 LAB — GLUCOSE, CAPILLARY
Glucose-Capillary: 72 mg/dL (ref 70–99)
Glucose-Capillary: 263 mg/dL — ABNORMAL HIGH (ref 70–99)
Glucose-Capillary: 429 mg/dL — ABNORMAL HIGH (ref 70–99)
Glucose-Capillary: 109 mg/dL — ABNORMAL HIGH (ref 70–99)

## 2019-02-13 LAB — MAGNESIUM: Magnesium: 1.9 mg/dL (ref 1.7–2.4)

## 2019-02-13 LAB — FERRITIN: Ferritin: 219 ng/mL (ref 24–336)

## 2019-02-13 LAB — D-DIMER, QUANTITATIVE: D-Dimer, Quant: 1.78 ug/mL-FEU — ABNORMAL HIGH (ref 0.00–0.50)

## 2019-02-13 LAB — C-REACTIVE PROTEIN: CRP: <0.8 mg/dL (ref <1.0)

## 2019-02-13 MED ORDER — FUROSEMIDE 10 MG/ML IJ SOLN
60.0000 mg | Freq: Once | INTRAMUSCULAR | Status: AC
  Administered 2019-02-13: 11:00:00 60 mg via INTRAVENOUS
  Filled 2019-02-13: qty 6

## 2019-02-13 NOTE — Progress Notes (Signed)
Assisted tele visit to patient with son.  Keyanni Whittinghill M Asiel Chrostowski, RN   

## 2019-02-13 NOTE — Progress Notes (Signed)
Pt had Elink call with family

## 2019-02-13 NOTE — Progress Notes (Signed)
PROGRESS NOTE                                                                                                                                                                                                             Patient Demographics:    Ryan Burke, is a 78 y.o. male, DOB - [birth year], TFT:[id number]  Outpatient Primary MD for the patient is Fleet Contras, MD   Admit date - 02/06/2019   LOS - 7  Chief Complaint  Patient presents with  . Covid positive  . Respiratory Distress  . Fever       Brief Narrative: Patient is a 78 y.o. male with PMHx of HTN, DM-2, CKD stage IIIb, s/p bioprosthetic aortic valve replacement-who presented with gradual worsening shortness of breath for the past 2-3 days, diarrhea-he was subsequently found to have acute hypoxic respiratory failure in the setting of COVID-19 pneumonia.   Subjective:    Ryan Burke remains comfortable-looks like his O2 requirement bumped up overnight (apparently required up to 9 L at night briefly)-this morning he was on around 4 L of oxygen.   Assessment  & Plan :   Acute Hypoxic Resp Failure due to Covid 19 Viral pneumonia: Continues to require approximately 3-4 L of oxygen.  Inflammatory markers have down trended-CRP has now normalized.  Continue tapering steroids-once oxygen requirements improved further-suspect he will be able to be discharged home oxygen.  Has completed a course of remdesivir, and is s/p convalescent plasma.  Fever: afebrile  O2 requirements: On 2 l/m  COVID-19 Labs: Recent Labs    02/11/19 0137 02/12/19 0420 02/13/19 0400  DDIMER 0.81* 0.80* 1.78*  FERRITIN 207 196 219  CRP 1.5* 1.0* <0.8    Lab Results  Component Value Date   SARSCOV2NAA Detected (A) 01/30/2019     COVID-19 Medications: Steroids:11/9>> Remdesivir: 11/9>> 11/13 Actemra: Will avoid given history of latent tuberculosis. Convalescent Plasma: On 11/11 x 1.  Research Studies:N/A  Other medications: Diuretics:Euvolemic-repeat Lasix x1 today. Antibiotics:Not needed as no evidence of bacterial infection- Rocephin/Zithromax d/c'd 11/11  Prone/Incentive Spirometry: encouraged patient to lie prone for 3-4 hours at a time for a total of 16 hours a day, and to encourage incentive spirometry use 3-4/hour.  DVT Prophylaxis  :  Lovenox   Non-anion gap metabolic acidosis: suspect 2/2 CKD/Diarrhea. Improving with supportive care. Beta hydroxy  butyrate neg-non gapped acidosis-hence doubt DKA.  CKD stage IIIb: Creatinine remains close to baseline-follow periodically.    DM-2 with uncontrolled hyperglycemia (A1c 11.1 on 02/06/2019): CBGs rapidly improving-better controlled-suspect at risk of hypoglycemia as steroid doses has been reduced-stop Lantus-continue SSI-follow and adjust.  CBG (last 3)  Recent Labs    02/12/19 1634 02/12/19 2009 02/13/19 0747  GLUCAP 267* 294* 72   Hypertension: controlled-continue cardura  Dyslipidemia: Continue statin  History of aortic valve replacement with bioprosthetic valve   ABG:    Component Value Date/Time   PHART 7.372 02/06/2019 1848   PCO2ART 21.7 (L) 02/06/2019 1848   PO2ART 98.8 02/06/2019 1848   HCO3 12.3 (L) 02/06/2019 1848   TCO2 25 03/30/2013 1229   ACIDBASEDEF 11.2 (H) 02/06/2019 1848   O2SAT 97.3 02/06/2019 1848    Vent Settings: N/A  Condition -Guarded  Family Communication  : Son over the phone on 11/16  Code Status :  Full Code  Diet :  Diet Order            Diet Carb Modified Fluid consistency: Thin; Room service appropriate? Yes  Diet effective now               Disposition Plan  :  Remain hospitalized-tentatively home with home health in the next 1-2 days depending on clinical progress.  Barriers to discharge: Hypoxia requiring O2 supplementation  Consults  :  None  Procedures  :  None  Antibiotics  :    Anti-infectives (From admission, onward)   Start      Dose/Rate Route Frequency Ordered Stop   02/07/19 2000  remdesivir 100 mg in sodium chloride 0.9 % 250 mL IVPB     100 mg 500 mL/hr over 30 Minutes Intravenous Every 24 hours 02/06/19 2216 02/10/19 1653   02/06/19 2245  remdesivir 200 mg in sodium chloride 0.9 % 250 mL IVPB     200 mg 500 mL/hr over 30 Minutes Intravenous Once 02/06/19 2216 02/07/19 0010   02/06/19 2245  cefTRIAXone (ROCEPHIN) 1 g in sodium chloride 0.9 % 100 mL IVPB  Status:  Discontinued     1 g 200 mL/hr over 30 Minutes Intravenous Every 24 hours 02/06/19 2238 02/08/19 1157   02/06/19 2245  azithromycin (ZITHROMAX) 500 mg in sodium chloride 0.9 % 250 mL IVPB  Status:  Discontinued     500 mg 250 mL/hr over 60 Minutes Intravenous Every 24 hours 02/06/19 2238 02/08/19 1157      Inpatient Medications  Scheduled Meds: . sodium chloride   Intravenous Once  . aspirin EC  81 mg Oral Daily  . Chlorhexidine Gluconate Cloth  6 each Topical Daily  . docusate sodium  100 mg Oral BID  . doxazosin  4 mg Oral Daily  . enoxaparin (LOVENOX) injection  40 mg Subcutaneous QHS  . furosemide  60 mg Intravenous Once  . insulin aspart  0-15 Units Subcutaneous TID WC  . insulin aspart  0-5 Units Subcutaneous QHS  . insulin glargine  12 Units Subcutaneous Daily  . mouth rinse  15 mL Mouth Rinse BID  . metoprolol tartrate  25 mg Oral BID  . pantoprazole  40 mg Oral Daily  . pravastatin  20 mg Oral QPM  . predniSONE  30 mg Oral Q breakfast  . sodium bicarbonate  1,300 mg Oral BID  . sodium chloride flush  3 mL Intravenous Q12H  . sodium chloride flush  3 mL Intravenous Q12H   Continuous Infusions: . sodium  chloride     PRN Meds:.sodium chloride, acetaminophen, albuterol, bisacodyl, ondansetron **OR** ondansetron (ZOFRAN) IV, oxyCODONE, polyethylene glycol, sodium chloride flush, zolpidem   Time Spent in minutes 25   See all Orders from today for further details   Oren Binet M.D on 02/13/2019 at 2:11 PM  To page go to  [email or web address] - use universal password  [facility] -  Office  [phone]    Objective:   Vitals:   02/12/19 1941 02/12/19 1945 02/13/19 0400 02/13/19 0800  BP: 129/77  110/68 136/78  Pulse: 60 (!) 58 (!) 55 (!) 55  Resp: (!) 23 19    Temp: 97.9 F (36.6 C)  97.9 F (36.6 C) 98 F (36.7 C)  TempSrc: Oral  Oral Oral  SpO2: (!) 86% 90% 91% 90%  Weight:      Height:        Wt Readings from Last 3 Encounters:  02/07/19 70.7 kg  08/27/16 71.8 kg  04/20/13 70.1 kg     Intake/Output Summary (Last 24 hours) at 02/13/2019 1411 Last data filed at 02/12/2019 1825 Gross per 24 hour  Intake 480 ml  Output -  Net 480 ml    Physical Exam Gen Exam:Alert awake-not in any distress HEENT:atraumatic, normocephalic Chest: B/L clear to auscultation anteriorly CVS:S1S2 regular Abdomen:soft non tender, non distended Extremities:no edema Neurology: Non focal Skin: no rash   Data Review:    CBC Recent Labs  Lab 02/09/19 0635 02/10/19 0335 02/11/19 0137 02/12/19 0420 02/13/19 0400  WBC 5.6 6.7 5.0 5.0 6.7  HGB 12.2* 12.7* 11.8* 12.2* 13.0  HCT 36.5* 37.9* 35.6* 37.8* 40.2  PLT 130* 154 140* 137* 151  MCV 84.3 83.1 83.4 84.2 85.4  MCH 28.2 27.9 27.6 27.2 27.6  MCHC 33.4 33.5 33.1 32.3 32.3  RDW 15.2 15.0 14.7 14.8 14.4  LYMPHSABS 0.5* 0.6* 0.7 0.6* 0.9  MONOABS 0.2 0.3 0.2 0.2 0.4  EOSABS 0.0 0.0 0.0 0.0 0.0  BASOSABS 0.0 0.0 0.0 0.0 0.0    Chemistries  Recent Labs  Lab 02/09/19 0635 02/10/19 0335 02/11/19 0137 02/12/19 0420 02/13/19 0400  NA 138 142 142 141 141  K 4.6 4.0 4.5 4.6 4.0  CL 112* 110 112* 110 108  CO2 16* 19* 20* 21* 24  GLUCOSE 232* 132* 74 113* 51*  BUN 44* 52* 57* 52* 51*  CREATININE 1.86* 1.91* 1.71* 1.42* 1.41*  CALCIUM 8.2* 8.3* 8.0* 8.2* 8.3*  MG 2.0 1.7 2.2 2.0 1.9  AST 51* 54* 55* 60* 75*  ALT 29 35 37 45* 53*  ALKPHOS 161* 163* 152* 152* 159*  BILITOT 1.0 1.0 1.0 1.3* 1.7*    ------------------------------------------------------------------------------------------------------------------ No results for input(s): CHOL, HDL, LDLCALC, TRIG, CHOLHDL, LDLDIRECT in the last 72 hours.  Lab Results  Component Value Date   HGBA1C 11.1 (H) 02/06/2019   ------------------------------------------------------------------------------------------------------------------ No results for input(s): TSH, T4TOTAL, T3FREE, THYROIDAB in the last 72 hours.  Invalid input(s): FREET3 ------------------------------------------------------------------------------------------------------------------ Recent Labs    02/12/19 0420 02/13/19 0400  FERRITIN 196 219    Coagulation profile No results for input(s): INR, PROTIME in the last 168 hours.  Recent Labs    02/12/19 0420 02/13/19 0400  DDIMER 0.80* 1.78*    Cardiac Enzymes No results for input(s): CKMB, TROPONINI, MYOGLOBIN in the last 168 hours.  Invalid input(s): CK ------------------------------------------------------------------------------------------------------------------    Component Value Date/Time   BNP 88.0 02/06/2019 1911    Micro Results Recent Results (from the past 240 hour(s))  Blood Culture (routine x 2)  Status: None   Collection Time: 02/06/19  7:11 PM   Specimen: BLOOD  Result Value Ref Range Status   Specimen Description   Final    BLOOD RIGHT ANTECUBITAL Performed at [facility], [address], [place], [place] [address]    Special Requests   Final    BOTTLES DRAWN AEROBIC AND ANAEROBIC Blood Culture results may not be optimal due to an excessive volume of blood received in culture bottles Performed at [facility], [address] [place], [place] [address]    Culture   Final    NO GROWTH 5 DAYS Performed at [facility], [address] [place], [place] [address]    Report Status 02/12/2019 FINAL  Final  Blood Culture (routine x 2)      Status: None   Collection Time: 02/06/19  7:11 PM   Specimen: BLOOD  Result Value Ref Range Status   Specimen Description   Final    BLOOD BLOOD RIGHT HAND Performed at [facility], [address] [place], [place] [address]    Special Requests   Final    BOTTLES DRAWN AEROBIC AND ANAEROBIC Blood Culture adequate volume Performed at [facility], [address], [place], [place] [address]    Culture   Final    NO GROWTH 5 DAYS Performed at [facility], [address], [place], [place] [address]    Report Status 02/12/2019 FINAL  Final  MRSA PCR Screening     Status: None   Collection Time: 02/07/19  9:15 PM   Specimen: Nasopharyngeal  Result Value Ref Range Status   MRSA by PCR NEGATIVE NEGATIVE Final    Comment:        The GeneXpert MRSA Assay (FDA approved for NASAL specimens only), is one component of a comprehensive MRSA colonization surveillance program. It is not intended to diagnose MRSA infection nor to guide or monitor treatment for MRSA infections. Performed at [facility], [address], [place], [place] [address]     Radiology Reports Dg Chest Port 1 View  Result Date: 02/06/2019 CLINICAL DATA:  Diarrhea and short of breath EXAM: PORTABLE CHEST 1 VIEW COMPARISON:  08/28/2016 FINDINGS: Post sternotomy changes. Diffuse bilateral reticular opacity, suspicious for underlying chronic interstitial lung disease. More confluent ground-glass opacity at the bases and periphery of the right lung. Mild cardiomegaly. No pneumothorax. IMPRESSION: Suspected underlying component of chronic interstitial lung disease. Right greater than left ground-glass opacities and peripheral consolidations, suspect for pneumonia, to include atypical or viral pneumonia. Electronically Signed   By: Jasmine Pang M.D.   On: 02/06/2019 19:50

## 2019-02-14 DIAGNOSIS — E785 Hyperlipidemia, unspecified: Secondary | ICD-10-CM | POA: Diagnosis not present

## 2019-02-14 DIAGNOSIS — J9601 Acute respiratory failure with hypoxia: Secondary | ICD-10-CM | POA: Diagnosis not present

## 2019-02-14 DIAGNOSIS — Z952 Presence of prosthetic heart valve: Secondary | ICD-10-CM | POA: Diagnosis not present

## 2019-02-14 DIAGNOSIS — U071 COVID-19: Secondary | ICD-10-CM | POA: Diagnosis not present

## 2019-02-14 DIAGNOSIS — I10 Essential (primary) hypertension: Secondary | ICD-10-CM | POA: Diagnosis not present

## 2019-02-14 LAB — C-REACTIVE PROTEIN: CRP: 2.4 mg/dL — ABNORMAL HIGH (ref <1.0)

## 2019-02-14 LAB — COMPREHENSIVE METABOLIC PANEL
ALT: 65 U/L — ABNORMAL HIGH (ref 0–44)
AST: 73 U/L — ABNORMAL HIGH (ref 15–41)
Albumin: 2.5 g/dL — ABNORMAL LOW (ref 3.5–5.0)
Alkaline Phosphatase: 150 U/L — ABNORMAL HIGH (ref 38–126)
Anion gap: 9 (ref 5–15)
BUN: 52 mg/dL — ABNORMAL HIGH (ref 8–23)
CO2: 26 mmol/L (ref 22–32)
Calcium: 8.5 mg/dL — ABNORMAL LOW (ref 8.9–10.3)
Chloride: 104 mmol/L (ref 98–111)
Creatinine, Ser: 1.63 mg/dL — ABNORMAL HIGH (ref 0.61–1.24)
GFR calc Af Amer: 46 mL/min — ABNORMAL LOW (ref >60)
GFR calc non Af Amer: 40 mL/min — ABNORMAL LOW (ref >60)
Glucose, Bld: 122 mg/dL — ABNORMAL HIGH (ref 70–99)
Potassium: 4.2 mmol/L (ref 3.5–5.1)
Sodium: 139 mmol/L (ref 135–145)
Total Bilirubin: 1.5 mg/dL — ABNORMAL HIGH (ref 0.3–1.2)
Total Protein: 5.8 g/dL — ABNORMAL LOW (ref 6.5–8.1)

## 2019-02-14 LAB — GLUCOSE, CAPILLARY
Glucose-Capillary: 95 mg/dL (ref 70–99)
Glucose-Capillary: 88 mg/dL (ref 70–99)
Glucose-Capillary: 259 mg/dL — ABNORMAL HIGH (ref 70–99)

## 2019-02-14 LAB — CBC
HCT: 37.4 % — ABNORMAL LOW (ref 39.0–52.0)
Hemoglobin: 12.1 g/dL — ABNORMAL LOW (ref 13.0–17.0)
MCH: 27.4 pg (ref 26.0–34.0)
MCHC: 32.4 g/dL (ref 30.0–36.0)
MCV: 84.6 fL (ref 80.0–100.0)
Platelets: 136 10*3/uL — ABNORMAL LOW (ref 150–400)
RBC: 4.42 MIL/uL (ref 4.22–5.81)
RDW: 14.5 % (ref 11.5–15.5)
WBC: 5.4 10*3/uL (ref 4.0–10.5)
nRBC: 0 % (ref 0.0–0.2)

## 2019-02-14 MED ORDER — PREDNISONE 10 MG PO TABS: ORAL_TABLET | ORAL | 0 refills | Status: DC

## 2019-02-14 MED ORDER — SODIUM BICARBONATE 650 MG PO TABS: 650.0000 mg | ORAL_TABLET | Freq: Two times a day (BID) | ORAL | 0 refills | Status: DC

## 2019-02-14 NOTE — Discharge Instructions (Signed)
YOU NEED TO CONTINUE ISOLATION FOR 3 WEEKS FROM 01/30/19            Person Under Monitoring Name: Ryan Burke  Location: [place] [address]   Infection Prevention Recommendations for Individuals Confirmed to have, or Being Evaluated for, 2019 Novel Coronavirus (COVID-19) Infection Who Receive Care at Home  Individuals who are confirmed to have, or are being evaluated for, COVID-19 should follow the prevention steps below until a healthcare provider or local or state health department says they can return to normal activities.  Stay home except to get medical care You should restrict activities outside your home, except for getting medical care. Do not go to work, school, or public areas, and do not use public transportation or taxis.  Call ahead before visiting your doctor Before your medical appointment, call the healthcare provider and tell them that you have, or are being evaluated for, COVID-19 infection. This will help the healthcare providers office take steps to keep other people from getting infected. Ask your healthcare provider to call the local or state health department.  Monitor your symptoms Seek prompt medical attention if your illness is worsening (e.g., difficulty breathing). Before going to your medical appointment, call the healthcare provider and tell them that you have, or are being evaluated for, COVID-19 infection. Ask your healthcare provider to call the local or state health department.  Wear a facemask You should wear a facemask that covers your nose and mouth when you are in the same room with other people and when you visit a healthcare provider. People who live with or visit you should also wear a facemask while they are in the same room with you.  Separate yourself from other people in your home As much as possible, you should stay in a different room from other people in your home. Also, you should use a  separate bathroom, if available.  Avoid sharing household items You should not share dishes, drinking glasses, cups, eating utensils, towels, bedding, or other items with other people in your home. After using these items, you should wash them thoroughly with soap and water.  Cover your coughs and sneezes Cover your mouth and nose with a tissue when you cough or sneeze, or you can cough or sneeze into your sleeve. Throw used tissues in a lined trash can, and immediately wash your hands with soap and water for at least 20 seconds or use an alcohol-based hand rub.  Wash your [facility] your hands often and thoroughly with soap and water for at least 20 seconds. You can use an alcohol-based hand sanitizer if soap and water are not available and if your hands are not visibly dirty. Avoid touching your eyes, nose, and mouth with unwashed hands.   Prevention Steps for Caregivers and Household Members of Individuals Confirmed to have, or Being Evaluated for, COVID-19 Infection Being Cared for in the Home  If you live with, or provide care at home for, a person confirmed to have, or being evaluated for, COVID-19 infection please follow these guidelines to prevent infection:  Follow healthcare providers instructions Make sure that you understand and can help the patient follow any healthcare provider instructions for all care.  Provide for the patients basic needs You should help the patient with basic needs in the home and provide support for getting groceries, prescriptions, and other personal needs.  Monitor the patients symptoms If they are getting sicker, call his or her medical provider and tell  them that the patient has, or is being evaluated for, COVID-19 infection. This will help the healthcare providers office take steps to keep other people from getting infected. Ask the healthcare provider to call the local or state health department.  Limit the number of people who have  contact with the patient  If possible, have only one caregiver for the patient.  Other household members should stay in another home or place of residence. If this is not possible, they should stay  in another room, or be separated from the patient as much as possible. Use a separate bathroom, if available.  Restrict visitors who do not have an essential need to be in the home.  Keep older adults, very young children, and other sick people away from the patient Keep older adults, very young children, and those who have compromised immune systems or chronic health conditions away from the patient. This includes people with chronic heart, lung, or kidney conditions, diabetes, and cancer.  Ensure good ventilation Make sure that shared spaces in the home have good air flow, such as from an air conditioner or an opened window, weather permitting.  Wash your hands often  Wash your hands often and thoroughly with soap and water for at least 20 seconds. You can use an alcohol based hand sanitizer if soap and water are not available and if your hands are not visibly dirty.  Avoid touching your eyes, nose, and mouth with unwashed hands.  Use disposable paper towels to dry your hands. If not available, use dedicated cloth towels and replace them when they become wet.  Wear a facemask and gloves  Wear a disposable facemask at all times in the room and gloves when you touch or have contact with the patients blood, body fluids, and/or secretions or excretions, such as sweat, saliva, sputum, nasal mucus, vomit, urine, or feces.  Ensure the mask fits over your nose and mouth tightly, and do not touch it during use.  Throw out disposable facemasks and gloves after using them. Do not reuse.  Wash your hands immediately after removing your facemask and gloves.  If your personal clothing becomes contaminated, carefully remove clothing and launder. Wash your hands after handling contaminated  clothing.  Place all used disposable facemasks, gloves, and other waste in a lined container before disposing them with other household waste.  Remove gloves and wash your hands immediately after handling these items.  Do not share dishes, glasses, or other household items with the patient  Avoid sharing household items. You should not share dishes, drinking glasses, cups, eating utensils, towels, bedding, or other items with a patient who is confirmed to have, or being evaluated for, COVID-19 infection.  After the person uses these items, you should wash them thoroughly with soap and water.  Wash laundry thoroughly  Immediately remove and wash clothes or bedding that have blood, body fluids, and/or secretions or excretions, such as sweat, saliva, sputum, nasal mucus, vomit, urine, or feces, on them.  Wear gloves when handling laundry from the patient.  Read and follow directions on labels of laundry or clothing items and detergent. In general, wash and dry with the warmest temperatures recommended on the label.  Clean all areas the individual has used often  Clean all touchable surfaces, such as counters, tabletops, doorknobs, bathroom fixtures, toilets, phones, keyboards, tablets, and bedside tables, every day. Also, clean any surfaces that may have blood, body fluids, and/or secretions or excretions on them.  Wear gloves when cleaning surfaces  the patient has come in contact with.  Use a diluted bleach solution (e.g., dilute bleach with 1 part bleach and 10 parts water) or a household disinfectant with a label that says EPA-registered for coronaviruses. To make a bleach solution at home, add 1 tablespoon of bleach to 1 quart (4 cups) of water. For a larger supply, add  cup of bleach to 1 gallon (16 cups) of water.  Read labels of cleaning products and follow recommendations provided on product labels. Labels contain instructions for safe and effective use of the cleaning product  including precautions you should take when applying the product, such as wearing gloves or eye protection and making sure you have good ventilation during use of the product.  Remove gloves and wash hands immediately after cleaning.  Monitor yourself for signs and symptoms of illness Caregivers and household members are considered close contacts, should monitor their health, and will be asked to limit movement outside of the home to the extent possible. Follow the monitoring steps for close contacts listed on the symptom monitoring form.   ? If you have additional questions, contact your local health department or call the epidemiologist on call at [phone] (available 24/7). ? This guidance is subject to change. For the most up-to-date guidance from [facility], please refer to their website: [email or web address]

## 2019-02-14 NOTE — Progress Notes (Signed)
Feels much better-walking to the bathroom with no SOB at all Suspect the O2 sat probe in finger is not accurate-changed to ear lobe-O2 sat in the low 90's on room air.Will discharge today on home 02.

## 2019-02-14 NOTE — Discharge Summary (Signed)
PATIENT DETAILS Name: Ryan Burke Age: 78 y.o. Sex: male Date of Birth: 11/27/1940 MRN: [id number]. Admitting Physician: Karmen Bongo, MD JHE:RDEYCXK, Christean Grief, MD  Admit Date: 02/06/2019 Discharge date: 02/14/2019  Recommendations for Outpatient Follow-up:  1. Follow up with PCP in 1-2 weeks 2. Please obtain CMP/CBC in one week 3. Repeat Chest Xray in 4-6 week 4. Please reassess at follow up if patient still requires Home O2  Admitted From:  Home  Disposition: Home with home health services   Home Health: Yes  Equipment/Devices: Oxygen 2-3 L  Discharge Condition: Stable  CODE STATUS: FULL CODE  Diet recommendation:  Diet Order            Diet - low sodium heart healthy        Diet Carb Modified        Diet Carb Modified Fluid consistency: Thin; Room service appropriate? Yes  Diet effective now               Brief Summary: See H&P, Labs, Consult and Test reports for all details in brief,Patient is a 78 y.o. male with PMHx of HTN, DM-2, CKD stage IIIb, s/p bioprosthetic aortic valve replacement-who presented with gradual worsening shortness of breath for the past 2-3 days, diarrhea-he was subsequently found to have acute hypoxic respiratory failure in the setting of COVID-19 pneumonia.  Brief Hospital Course: Acute Hypoxic Resp Failure due to Covid 19 Viral pneumonia: Much improved-requiring 2L of O2 for past several days (O2sat in finger not accurate-seems more accurate and matches clinical state if probe switched to ear lobe). Treated with 5 days of remdesivir, steroids and convalescent plasma. Will continue tapering steroids on discharge.   COVID-19 Labs:  Recent Labs    02/12/19 0420 02/13/19 0400 02/14/19 0345  DDIMER 0.80* 1.78*  --   FERRITIN 196 219  --   CRP 1.0* <0.8 2.4*    Lab Results  Component Value Date   SARSCOV2NAA Detected (A) 01/30/2019     COVID-19 Medications: Steroids:11/9>> Remdesivir: 11/9>> 11/13 Actemra: Will avoid  given history of latent tuberculosis. Convalescent Plasma: On 11/11 x 1. Research Studies:N/A  Non-anion gap metabolic acidosis: suspect 2/2 CKD/Diarrhea. Improving with supportive care. Beta hydroxy butyrate neg-non gapped acidosis-hence doubt DKA.  CKD stage IIIb: Creatinine remains close to baseline-follow periodically.    DM-2 with uncontrolled hyperglycemia (A1c 11.1 on 02/06/2019): CBGs rapidly improving---suspect at risk of hypoglycemia as steroid doses has been reduced-hence have discontinued lantus. Patient not keen on starting Insulin on discharge-suspect since CBG's no much better, he can just continue his oral hypoglycemic agents and be discharged home.   Procedures/Studies: None  Discharge Diagnoses:  Principal Problem:   Acute respiratory disease due to COVID-19 virus Active Problems:   Essential hypertension, benign   Hyperlipidemia   S/P AVR (aortic valve replacement)   Atrial fibrillation (HCC)   Seizures (HCC)   Discharge Instructions:    Person Under Monitoring Name: YVETTE ROARK  Location: [place] [address]   Infection Prevention Recommendations for Individuals Confirmed to have, or Being Evaluated for, 2019 Novel Coronavirus (COVID-19) Infection Who Receive Care at Home  Individuals who are confirmed to have, or are being evaluated for, COVID-19 should follow the prevention steps below until a healthcare provider or local or state health department says they can return to normal activities.  Stay home except to get medical care You should restrict activities outside your home, except for getting medical care. Do not go to work, school,  or public areas, and do not use public transportation or taxis.  Call ahead before visiting your doctor Before your medical appointment, call the healthcare provider and tell them that you have, or are being evaluated for, COVID-19 infection. This will help the healthcare provider's office  take steps to keep other people from getting infected. Ask your healthcare provider to call the local or state health department.  Monitor your symptoms Seek prompt medical attention if your illness is worsening (e.g., difficulty breathing). Before going to your medical appointment, call the healthcare provider and tell them that you have, or are being evaluated for, COVID-19 infection. Ask your healthcare provider to call the local or state health department.  Wear a facemask You should wear a facemask that covers your nose and mouth when you are in the same room with other people and when you visit a healthcare provider. People who live with or visit you should also wear a facemask while they are in the same room with you.  Separate yourself from other people in your home As much as possible, you should stay in a different room from other people in your home. Also, you should use a separate bathroom, if available.  Avoid sharing household items You should not share dishes, drinking glasses, cups, eating utensils, towels, bedding, or other items with other people in your home. After using these items, you should wash them thoroughly with soap and water.  Cover your coughs and sneezes Cover your mouth and nose with a tissue when you cough or sneeze, or you can cough or sneeze into your sleeve. Throw used tissues in a lined trash can, and immediately wash your hands with soap and water for at least 20 seconds or use an alcohol-based hand rub.  Wash your [employer] your hands often and thoroughly with soap and water for at least 20 seconds. You can use an alcohol-based hand sanitizer if soap and water are not available and if your hands are not visibly dirty. Avoid touching your eyes, nose, and mouth with unwashed hands.   Prevention Steps for Caregivers and Household Members of Individuals Confirmed to have, or Being Evaluated for, COVID-19 Infection Being Cared for in the Home  If  you live with, or provide care at home for, a person confirmed to have, or being evaluated for, COVID-19 infection please follow these guidelines to prevent infection:  Follow healthcare provider's instructions Make sure that you understand and can help the patient follow any healthcare provider instructions for all care.  Provide for the patient's basic needs You should help the patient with basic needs in the home and provide support for getting groceries, prescriptions, and other personal needs.  Monitor the patient's symptoms If they are getting sicker, call his or her medical provider and tell them that the patient has, or is being evaluated for, COVID-19 infection. This will help the healthcare provider's office take steps to keep other people from getting infected. Ask the healthcare provider to call the local or state health department.  Limit the number of people who have contact with the patient  If possible, have only one caregiver for the patient.  Other household members should stay in another home or place of residence. If this is not possible, they should stay  in another room, or be separated from the patient as much as possible. Use a separate bathroom, if available.  Restrict visitors who do not have an essential need to be in the home.  Keep older  adults, very young children, and other sick people away from the patient Keep older adults, very young children, and those who have compromised immune systems or chronic health conditions away from the patient. This includes people with chronic heart, lung, or kidney conditions, diabetes, and cancer.  Ensure good ventilation Make sure that shared spaces in the home have good air flow, such as from an air conditioner or an opened window, weather permitting.  Wash your hands often  Wash your hands often and thoroughly with soap and water for at least 20 seconds. You can use an alcohol based hand sanitizer if soap and water  are not available and if your hands are not visibly dirty.  Avoid touching your eyes, nose, and mouth with unwashed hands.  Use disposable paper towels to dry your hands. If not available, use dedicated cloth towels and replace them when they become wet.  Wear a facemask and gloves  Wear a disposable facemask at all times in the room and gloves when you touch or have contact with the patient's blood, body fluids, and/or secretions or excretions, such as sweat, saliva, sputum, nasal mucus, vomit, urine, or feces.  Ensure the mask fits over your nose and mouth tightly, and do not touch it during use.  Throw out disposable facemasks and gloves after using them. Do not reuse.  Wash your hands immediately after removing your facemask and gloves.  If your personal clothing becomes contaminated, carefully remove clothing and launder. Wash your hands after handling contaminated clothing.  Place all used disposable facemasks, gloves, and other waste in a lined container before disposing them with other household waste.  Remove gloves and wash your hands immediately after handling these items.  Do not share dishes, glasses, or other household items with the patient  Avoid sharing household items. You should not share dishes, drinking glasses, cups, eating utensils, towels, bedding, or other items with a patient who is confirmed to have, or being evaluated for, COVID-19 infection.  After the person uses these items, you should wash them thoroughly with soap and water.  Wash laundry thoroughly  Immediately remove and wash clothes or bedding that have blood, body fluids, and/or secretions or excretions, such as sweat, saliva, sputum, nasal mucus, vomit, urine, or feces, on them.  Wear gloves when handling laundry from the patient.  Read and follow directions on labels of laundry or clothing items and detergent. In general, wash and dry with the warmest temperatures recommended on the label.   Clean all areas the individual has used often  Clean all touchable surfaces, such as counters, tabletops, doorknobs, bathroom fixtures, toilets, phones, keyboards, tablets, and bedside tables, every day. Also, clean any surfaces that may have blood, body fluids, and/or secretions or excretions on them.  Wear gloves when cleaning surfaces the patient has come in contact with.  Use a diluted bleach solution (e.g., dilute bleach with 1 part bleach and 10 parts water) or a household disinfectant with a label that says EPA-registered for coronaviruses. To make a bleach solution at home, add 1 tablespoon of bleach to 1 quart (4 cups) of water. For a larger supply, add  cup of bleach to 1 gallon (16 cups) of water.  Read labels of cleaning products and follow recommendations provided on product labels. Labels contain instructions for safe and effective use of the cleaning product including precautions you should take when applying the product, such as wearing gloves or eye protection and making sure you have good ventilation during use  of the product.  Remove gloves and wash hands immediately after cleaning.  Monitor yourself for signs and symptoms of illness Caregivers and household members are considered close contacts, should monitor their health, and will be asked to limit movement outside of the home to the extent possible. Follow the monitoring steps for close contacts listed on the symptom monitoring form.   ? If you have additional questions, contact your local health department or call the epidemiologist on call at [phone] (available 24/7). ? This guidance is subject to change. For the most up-to-date guidance from CDC, please refer to their website: [employer]Met[employer].hu    Activity:  As tolerated with Full fall precautions use walker/cane & assistance as needed  Discharge Instructions    Call MD for:  difficulty breathing,  headache or visual disturbances   Complete by: As directed    Call MD for:  extreme fatigue   Complete by: As directed    Call MD for:  persistant dizziness or light-headedness   Complete by: As directed    Diet - low sodium heart healthy   Complete by: As directed    Diet Carb Modified   Complete by: As directed    Discharge instructions   Complete by: As directed    Follow with Primary MD  Fleet Contras, MD in 1-2 weeks  Please get a complete blood count and chemistry panel checked by your Primary MD at your next visit, and again as instructed by your Primary MD.  Get Medicines reviewed and adjusted: Please take all your medications with you for your next visit with your Primary MD  Laboratory/radiological data: Please request your Primary MD to go over all hospital tests and procedure/radiological results at the follow up, please ask your Primary MD to get all Hospital records sent to his/her office.  In some cases, they will be blood work, cultures and biopsy results pending at the time of your discharge. Please request that your primary care M.D. follows up on these results.  Also Note the following: If you experience worsening of your admission symptoms, develop shortness of breath, life threatening emergency, suicidal or homicidal thoughts you must seek medical attention immediately by calling 911 or calling your MD immediately  if symptoms less severe.  You must read complete instructions/literature along with all the possible adverse reactions/side effects for all the Medicines you take and that have been prescribed to you. Take any new Medicines after you have completely understood and accpet all the possible adverse reactions/side effects.   Do not drive when taking Pain medications or sleeping medications (Benzodaizepines)  Do not take more than prescribed Pain, Sleep and Anxiety Medications. It is not advisable to combine anxiety,sleep and pain medications without talking  with your primary care practitioner  Special Instructions: If you have smoked or chewed Tobacco  in the last 2 yrs please stop smoking, stop any regular Alcohol  and or any Recreational drug use.  Wear Seat belts while driving.  Please note: You were cared for by a hospitalist during your hospital stay. Once you are discharged, your primary care physician will handle any further medical issues. Please note that NO REFILLS for any discharge medications will be authorized once you are discharged, as it is imperative that you return to your primary care physician (or establish a relationship with a primary care physician if you do not have one) for your post hospital discharge needs so that they can reassess your need for medications and monitor your lab values.  Increase activity slowly   Complete by: As directed    MyChart COVID-19 home monitoring program   Complete by: Feb 14, 2019    Is the patient willing to use the MyChart Mobile App for home monitoring?: Yes   Temperature monitoring   Complete by: Feb 14, 2019    After how many days would you like to receive a notification of this patient's flowsheet entries?: 1     Allergies as of 02/14/2019   No Known Allergies     Medication List    TAKE these medications   albuterol 108 (90 Base) MCG/ACT inhaler Commonly known as: VENTOLIN HFA Inhale 2 puffs into the lungs every 6 (six) hours as needed. For shortness of breath   aspirin EC 81 MG tablet Take 81 mg by mouth daily.   cetirizine 10 MG tablet Commonly known as: ZYRTEC Take 10 mg by mouth daily as needed.   colchicine 0.6 MG tablet Take 0.6 mg by mouth 2 (two) times daily as needed (gout).   doxazosin 4 MG tablet Commonly known as: CARDURA Take 4 mg by mouth daily.   Farxiga 5 MG Tabs tablet Generic drug: dapagliflozin propanediol Take 5 mg by mouth daily.   Januvia 50 MG tablet Generic drug: sitaGLIPtin Take 50 mg by mouth daily.   metoprolol tartrate 25 MG  tablet Commonly known as: LOPRESSOR Take 1 tablet (25 mg total) by mouth 2 (two) times daily.   omeprazole 20 MG capsule Commonly known as: PRILOSEC Take 20 mg by mouth 2 (two) times daily.   oxyCODONE 5 MG immediate release tablet Commonly known as: Roxicodone Take 1 tablet (5 mg total) by mouth every 6 (six) hours as needed for severe pain.   pravastatin 20 MG tablet Commonly known as: PRAVACHOL Take 20 mg by mouth every evening.   predniSONE 10 MG tablet Commonly known as: DELTASONE Take  30 mg daily for 1 day, 20 mg daily for 1 days,10 mg daily for 1 day, then stop   sodium bicarbonate 650 MG tablet Take 1 tablet (650 mg total) by mouth 2 (two) times daily.            Durable Medical Equipment  (From admission, onward)         Start     Ordered   02/12/19 1552  For home use only DME 3 n 1  Once     02/12/19 1551   02/12/19 1114  For home use only DME Walker rolling  Once    Question:  Patient needs a walker to treat with the following condition  Answer:  Physical deconditioning   02/12/19 1113   02/12/19 1114  For home use only DME oxygen  Once    Question Answer Comment  Length of Need 6 Months   Mode or (Route) Nasal cannula   Liters per Minute 2   Frequency Continuous (stationary and portable oxygen unit needed)   Oxygen conserving device Yes   Oxygen delivery system Gas      02/12/19 1114         Follow-up Information    [facility] Follow up.   Specialty: Home Health Services Why: Home health Contact information: [address] [place] [address] [phone]        [employer] Follow up.   Why: home oxygen, bedside commode and rolling walker Contact information: [address] [place] [address] [phone]          No  Known Allergies  Consultations:   None  Other Procedures/Studies: Dg Chest Port 1 View  Result Date: 02/06/2019 CLINICAL DATA:  Diarrhea and short of breath  EXAM: PORTABLE CHEST 1 VIEW COMPARISON:  08/28/2016 FINDINGS: Post sternotomy changes. Diffuse bilateral reticular opacity, suspicious for underlying chronic interstitial lung disease. More confluent ground-glass opacity at the bases and periphery of the right lung. Mild cardiomegaly. No pneumothorax. IMPRESSION: Suspected underlying component of chronic interstitial lung disease. Right greater than left ground-glass opacities and peripheral consolidations, suspect for pneumonia, to include atypical or viral pneumonia. Electronically Signed   By: Jasmine Pang M.D.   On: 02/06/2019 19:50     TODAY-DAY OF DISCHARGE:  Subjective:   Mindy Weightman today has no headache,no chest abdominal pain,no new weakness tingling or numbness, feels much better wants to go home today.   Objective:   Blood pressure 117/74, pulse 60, temperature 97.6 F (36.4 C), temperature source Oral, resp. rate 19, height  (1.651 m), weight 70.7 kg, SpO2 96 %.  Intake/Output Summary (Last 24 hours) at 02/14/2019 0948 Last data filed at 02/13/2019 1800 Gross per 24 hour  Intake 960 ml  Output -  Net 960 ml   Filed Weights   02/06/19 1848 02/07/19 2130  Weight: 71.8 kg 70.7 kg    Exam: Awake Alert, Oriented *3, No new F.N deficits, Normal affect [place].AT,PERRAL Supple Neck,No JVD, No cervical lymphadenopathy appriciated.  Symmetrical Chest wall movement, Good air movement bilaterally, CTAB RRR,No Gallops,Rubs or new Murmurs, No Parasternal Heave +ve B.Sounds, Abd Soft, Non tender, No organomegaly appriciated, No rebound -guarding or rigidity. No Cyanosis, Clubbing or edema, No new Rash or bruise   PERTINENT RADIOLOGIC STUDIES: Dg Chest Port 1 View  Result Date: 02/06/2019 CLINICAL DATA:  Diarrhea and short of breath EXAM: PORTABLE CHEST 1 VIEW COMPARISON:  08/28/2016 FINDINGS: Post sternotomy changes. Diffuse bilateral reticular opacity, suspicious for underlying chronic interstitial lung disease. More  confluent ground-glass opacity at the bases and periphery of the right lung. Mild cardiomegaly. No pneumothorax. IMPRESSION: Suspected underlying component of chronic interstitial lung disease. Right greater than left ground-glass opacities and peripheral consolidations, suspect for pneumonia, to include atypical or viral pneumonia. Electronically Signed   By: Jasmine Pang M.D.   On: 02/06/2019 19:50     PERTINENT LAB RESULTS: CBC: Recent Labs    02/13/19 0400 02/14/19 0345  WBC 6.7 5.4  HGB 13.0 12.1*  HCT 40.2 37.4*  PLT 151 136*   CMET CMP     Component Value Date/Time   NA 139 02/14/2019 0345   K 4.2 02/14/2019 0345   CL 104 02/14/2019 0345   CO2 26 02/14/2019 0345   GLUCOSE 122 (H) 02/14/2019 0345   BUN 52 (H) 02/14/2019 0345   CREATININE 1.63 (H) 02/14/2019 0345   CALCIUM 8.5 (L) 02/14/2019 0345   PROT 5.8 (L) 02/14/2019 0345   ALBUMIN 2.5 (L) 02/14/2019 0345   AST 73 (H) 02/14/2019 0345   ALT 65 (H) 02/14/2019 0345   ALKPHOS 150 (H) 02/14/2019 0345   BILITOT 1.5 (H) 02/14/2019 0345   GFRNONAA 40 (L) 02/14/2019 0345   GFRAA 46 (L) 02/14/2019 0345    GFR Estimated Creatinine Clearance: 33 mL/min (A) (by C-G formula based on SCr of 1.63 mg/dL (H)). No results for input(s): LIPASE, AMYLASE in the last 72 hours. No results for input(s): CKTOTAL, CKMB, CKMBINDEX, TROPONINI in the last 72 hours. Invalid input(s): POCBNP Recent Labs    02/12/19 0420 02/13/19 0400  DDIMER 0.80* 1.78*  No results for input(s): HGBA1C in the last 72 hours. No results for input(s): CHOL, HDL, LDLCALC, TRIG, CHOLHDL, LDLDIRECT in the last 72 hours. No results for input(s): TSH, T4TOTAL, T3FREE, THYROIDAB in the last 72 hours.  Invalid input(s): FREET3 Recent Labs    02/12/19 0420 02/13/19 0400  FERRITIN 196 219   Coags: No results for input(s): INR in the last 72 hours.  Invalid input(s): PT Microbiology: Recent Results (from the past 240 hour(s))  Blood Culture (routine x  2)     Status: None   Collection Time: 02/06/19  7:11 PM   Specimen: BLOOD  Result Value Ref Range Status   Specimen Description   Final    BLOOD RIGHT ANTECUBITAL Performed at [facility], [address] [place], [place] [address]    Special Requests   Final    BOTTLES DRAWN AEROBIC AND ANAEROBIC Blood Culture results may not be optimal due to an excessive volume of blood received in culture bottles Performed at [facility], [address] [place], [place] [address]    Culture   Final    NO GROWTH 5 DAYS Performed at [facility], [address] [place], [place] [address]    Report Status 02/12/2019 FINAL  Final  Blood Culture (routine x 2)     Status: None   Collection Time: 02/06/19  7:11 PM   Specimen: BLOOD  Result Value Ref Range Status   Specimen Description   Final    BLOOD BLOOD RIGHT HAND Performed at [facility], [address] [place], [place] [address]    Special Requests   Final    BOTTLES DRAWN AEROBIC AND ANAEROBIC Blood Culture adequate volume Performed at [facility], [address] [place], [place] [address]    Culture   Final    NO GROWTH 5 DAYS Performed at [facility], [address] [place], [place] [address]    Report Status 02/12/2019 FINAL  Final  MRSA PCR Screening     Status: None   Collection Time: 02/07/19  9:15 PM   Specimen: Nasopharyngeal  Result Value Ref Range Status   MRSA by PCR NEGATIVE NEGATIVE Final    Comment:        The GeneXpert MRSA Assay (FDA approved for NASAL specimens only), is one component of a comprehensive MRSA colonization surveillance program. It is not intended to diagnose MRSA infection nor to guide or monitor treatment for MRSA infections. Performed at [facility], [address] [place], [place] [address]     FURTHER DISCHARGE INSTRUCTIONS:  Get Medicines reviewed and adjusted: Please  take all your medications with you for your next visit with your Primary MD  Laboratory/radiological data: Please request your Primary MD to go over all hospital tests and procedure/radiological results at the follow up, please ask your Primary MD to get all Hospital records sent to his/her office.  In some cases, they will be blood work, cultures and biopsy results pending at the time of your discharge. Please request that your primary care M.D. goes through all the records of your hospital data and follows up on these results.  Also Note the following: If you experience worsening of your admission symptoms, develop shortness of breath, life threatening emergency, suicidal or homicidal thoughts you must seek medical attention immediately by calling 911 or calling your MD immediately  if symptoms less severe.  You must read complete instructions/literature along with  all the possible adverse reactions/side effects for all the Medicines you take and that have been prescribed to you. Take any new Medicines after you have completely understood and accpet all the possible adverse reactions/side effects.   Do not drive when taking Pain medications or sleeping medications (Benzodaizepines)  Do not take more than prescribed Pain, Sleep and Anxiety Medications. It is not advisable to combine anxiety,sleep and pain medications without talking with your primary care practitioner  Special Instructions: If you have smoked or chewed Tobacco  in the last 2 yrs please stop smoking, stop any regular Alcohol  and or any Recreational drug use.  Wear Seat belts while driving.  Please note: You were cared for by a hospitalist during your hospital stay. Once you are discharged, your primary care physician will handle any further medical issues. Please note that NO REFILLS for any discharge medications will be authorized once you are discharged, as it is imperative that you return to your primary care physician (or  establish a relationship with a primary care physician if you do not have one) for your post hospital discharge needs so that they can reassess your need for medications and monitor your lab values.  Total Time spent coordinating discharge including counseling, education and face to face time equals 35 minutes.  SignedJeoffrey Massed 02/14/2019 9:48 AM

## 2019-02-16 ENCOUNTER — Encounter (INDEPENDENT_AMBULATORY_CARE_PROVIDER_SITE_OTHER): Payer: Self-pay

## 2019-02-19 ENCOUNTER — Encounter (INDEPENDENT_AMBULATORY_CARE_PROVIDER_SITE_OTHER): Payer: Self-pay

## 2019-02-20 ENCOUNTER — Encounter (INDEPENDENT_AMBULATORY_CARE_PROVIDER_SITE_OTHER): Payer: Self-pay

## 2019-02-22 ENCOUNTER — Encounter (INDEPENDENT_AMBULATORY_CARE_PROVIDER_SITE_OTHER): Payer: Self-pay

## 2019-02-23 ENCOUNTER — Encounter (INDEPENDENT_AMBULATORY_CARE_PROVIDER_SITE_OTHER): Payer: Self-pay

## 2019-02-24 ENCOUNTER — Encounter (INDEPENDENT_AMBULATORY_CARE_PROVIDER_SITE_OTHER): Payer: Self-pay

## 2019-02-26 ENCOUNTER — Encounter (INDEPENDENT_AMBULATORY_CARE_PROVIDER_SITE_OTHER): Payer: Self-pay

## 2019-12-12 NOTE — Progress Notes (Signed)
Patient referred by Ryan Ebbs, MD for aortic valve disorder  Subjective:   Ryan Burke, male    DOB: 05-Jan-1941, 79 y.o.   MRN: [id number]   Chief Complaint  Patient presents with  . aortic valve  . New Patient (Initial Visit)    HPI  79 y.o. 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  Patient was previously seen by Dr. Einar Burke, last in 2017.  He is here with his son Ryan Burke, who interprets for the patient.  Patient had Covid in November 2020.  Ever since, he has been on supplemental oxygen 2 L.  He denies any chest pain, orthopnea, PND, leg edema, presyncope, syncope symptoms.   Past Medical History:  Diagnosis Date  . Aortic regurgitation   . Asthma   . Chest pain at rest   . Chronic bronchitis ([place])   . Chronic kidney disease   . Diabetes mellitus without complication ([place])    "has had it before; he's ok now; not on RC anymore" (08/27/2016)  . GERD (gastroesophageal reflux disease)   . Gout   . Heart murmur   . Hyperlipidemia   . Hypertension   . Mitral regurgitation   . Seizures ([place]) 03/2013 X 1   "maybe because of a high fever"  . Shortness of breath    "w/activity" (08/27/2016)     Past Surgical History:  Procedure Laterality Date  . ABDOMINAL AORTAGRAM N/A 04/12/2012   Procedure: ABDOMINAL Maxcine Ham;  Surgeon: Laverda Page, MD;  Location: [facility] CATH LAB;  Service: Cardiovascular;  Laterality: N/A;  . 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   Procedure: AORTIC VALVE REPLACEMENT (AVR);  Surgeon: Gaye Pollack, MD;  Location: [facility];  Service: Open Heart Surgery;  Laterality: N/A;  . CARDIAC CATHETERIZATION    . CARDIAC VALVE REPLACEMENT    . CATARACT EXTRACTION W/ INTRAOCULAR LENS  IMPLANT, BILATERAL Bilateral   . INTRAOPERATIVE TRANSESOPHAGEAL ECHOCARDIOGRAM  05/04/2012   Procedure: INTRAOPERATIVE TRANSESOPHAGEAL ECHOCARDIOGRAM;  Surgeon: Gaye Pollack, MD;  Location: [facility] OR;  Service: Open Heart  Surgery;  Laterality: N/A;  . LEFT AND RIGHT HEART CATHETERIZATION WITH CORONARY ANGIOGRAM N/A 04/12/2012   Procedure: LEFT AND RIGHT HEART CATHETERIZATION WITH CORONARY ANGIOGRAM;  Surgeon: Laverda Page, MD;  Location: [facility] CATH LAB;  Service: Cardiovascular;  Laterality: N/A;     Social History   Tobacco Use  Smoking Status Former Smoker  . Packs/day: 0.50  . Years: 4.00  . Pack years: 2.00  . Types: Cigarettes  . Quit date: 03/30/2001  . Years since quitting: 18.7  Smokeless Tobacco Current User  . Types: Chew    Social History   Substance and Sexual Activity  Alcohol Use Yes  . Alcohol/week: 1.0 standard drink  . Types: 1 Cans of beer per week     Family History  Problem Relation Age of Onset  . Heart disease Sister      Current Outpatient Medications on File Prior to Visit  Medication Sig Dispense Refill  . albuterol (PROVENTIL HFA;VENTOLIN HFA) 108 (90 BASE) MCG/ACT inhaler Inhale 2 puffs into the lungs every 6 (six) hours as needed. For shortness of breath    . aspirin EC 81 MG tablet Take 81 mg by mouth daily.    . cetirizine (ZYRTEC) 10 MG tablet Take 10 mg by mouth daily as needed.    . colchicine 0.6 MG tablet Take 0.6 mg by mouth 2 (  two) times daily as needed (gout).    Marland Kitchen doxazosin (CARDURA) 4 MG tablet Take 4 mg by mouth daily.    Marland Kitchen FARXIGA 5 MG TABS tablet Take 5 mg by mouth daily.    Marland Kitchen JANUVIA 50 MG tablet Take 50 mg by mouth daily.    . metoprolol tartrate (LOPRESSOR) 25 MG tablet Take 1 tablet (25 mg total) by mouth 2 (two) times daily. 60 tablet 1  . omeprazole (PRILOSEC) 20 MG capsule Take 20 mg by mouth 2 (two) times daily.     Marland Kitchen oxyCODONE (ROXICODONE) 5 MG immediate release tablet Take 1 tablet (5 mg total) by mouth every 6 (six) hours as needed for severe pain. 20 tablet 0  . pravastatin (PRAVACHOL) 20 MG tablet Take 20 mg by mouth every evening.    . predniSONE (DELTASONE) 10 MG tablet Take  30 mg daily for 1 day, 20 mg daily for 1 days,10 mg  daily for 1 day, then stop 6 tablet 0  . sodium bicarbonate 650 MG tablet Take 1 tablet (650 mg total) by mouth 2 (two) times daily. 60 tablet 0   No current facility-administered medications on file prior to visit.    Cardiovascular and other pertinent studies:  EKG 12/13/2019:   Sinus rhythm 64 bpm Normal EKG  Echocardiogram 2016: EF 55-60%. Grade 1 DD Bioprosthetic aortic valve, satisfactory functioning. Mean PG 11 mmHg. Mild TR. IVC dilated.  No change from 2014.  Echocardiogram 2011: - Left ventricle: The cavity size was normal. There was mild   concentric hypertrophy. Systolic function was normal. The   estimated ejection fraction was in the range of 60% to 65%. Wall   motion was normal; there were no regional wall motion   abnormalities. Doppler parameters are consistent with abnormal   left ventricular relaxation (grade 1 diastolic dysfunction).  - Aortic valve: Moderate to severe regurgitation directed towards   the mitral anterior leaflet.     Recent labs: 02/27/2019: Glucose 137, BUN/Cr 15/1.89. EGFR 33. Na/K 133/4.7. Mg 1.4 H/H 11.3/33.7. MCV 85.1. Platelets 110    Review of Systems  Cardiovascular: Positive for dyspnea on exertion. Negative for chest pain, leg swelling, palpitations and syncope.         Vitals:   12/13/19 1356  BP: 114/69  Pulse: 64  SpO2: 97%     Body mass index is 27.96 kg/m. Filed Weights   12/13/19 1356  Weight: 168 lb (76.2 kg)     Objective:   Physical Exam Vitals and nursing note reviewed.  Constitutional:      General: He is not in acute distress. Neck:     Vascular: No JVD.  Cardiovascular:     Rate and Rhythm: Normal rate and regular rhythm.     Heart sounds: Murmur heard. High-pitched blowing holosystolic murmur is present with a grade of 2/6 at the apex.   Pulmonary:     Effort: Pulmonary effort is normal.     Breath sounds: Normal breath sounds. No wheezing or rales.           Assessment & Recommendations:   79 y.o. 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  Exertional dyspnea: Most likely related to post Covid symptoms. I will obtain echocardiogram to evaluate valvular function. Continue Aspirin 81 mg, endocarditis prophylaxis. No change in medical therapy today.     Thank you for referring the patient to [place]. Please feel free to contact  with any questions.   Nigel Mormon, MD Pager: [phone] Office: [phone].

## 2019-12-13 ENCOUNTER — Ambulatory Visit: Payer: Medicare Other | Admitting: Cardiology

## 2019-12-13 ENCOUNTER — Encounter: Payer: Self-pay | Admitting: Cardiology

## 2019-12-13 ENCOUNTER — Other Ambulatory Visit: Payer: Self-pay

## 2019-12-13 VITALS — BP 114/69 | HR 64 | Ht 65.0 in | Wt 168.0 lb

## 2019-12-13 DIAGNOSIS — R0609 Other forms of dyspnea: Secondary | ICD-10-CM

## 2019-12-13 DIAGNOSIS — R0600 Dyspnea, unspecified: Secondary | ICD-10-CM

## 2019-12-13 DIAGNOSIS — Z952 Presence of prosthetic heart valve: Secondary | ICD-10-CM

## 2019-12-26 ENCOUNTER — Other Ambulatory Visit: Payer: Self-pay

## 2019-12-26 DIAGNOSIS — R0600 Dyspnea, unspecified: Secondary | ICD-10-CM

## 2019-12-26 DIAGNOSIS — R0609 Other forms of dyspnea: Secondary | ICD-10-CM

## 2019-12-27 ENCOUNTER — Ambulatory Visit: Payer: Medicare Other

## 2019-12-27 ENCOUNTER — Other Ambulatory Visit: Payer: Self-pay

## 2019-12-27 DIAGNOSIS — R0609 Other forms of dyspnea: Secondary | ICD-10-CM

## 2019-12-27 DIAGNOSIS — R0600 Dyspnea, unspecified: Secondary | ICD-10-CM

## 2020-05-10 ENCOUNTER — Other Ambulatory Visit: Payer: Self-pay | Admitting: Internal Medicine

## 2020-05-11 LAB — TSH: TSH: 1.98 mIU/L (ref 0.40–4.50)

## 2020-05-11 LAB — CBC
HCT: 40.6 % (ref 38.5–50.0)
Hemoglobin: 13.2 g/dL (ref 13.2–17.1)
MCH: 27.6 pg (ref 27.0–33.0)
MCHC: 32.5 g/dL (ref 32.0–36.0)
MCV: 84.9 fL (ref 80.0–100.0)
MPV: 9.7 fL (ref 7.5–12.5)
Platelets: 110 10*3/uL — ABNORMAL LOW (ref 140–400)
RBC: 4.78 10*6/uL (ref 4.20–5.80)
RDW: 16.3 % — ABNORMAL HIGH (ref 11.0–15.0)
WBC: 4.2 10*3/uL (ref 3.8–10.8)

## 2020-05-11 LAB — COMPLETE METABOLIC PANEL WITH GFR
AG Ratio: 1.4 (calc) (ref 1.0–2.5)
ALT: 25 U/L (ref 9–46)
AST: 36 U/L — ABNORMAL HIGH (ref 10–35)
Albumin: 3.4 g/dL — ABNORMAL LOW (ref 3.6–5.1)
Alkaline phosphatase (APISO): 161 U/L — ABNORMAL HIGH (ref 35–144)
BUN/Creatinine Ratio: 10 (calc) (ref 6–22)
BUN: 18 mg/dL (ref 7–25)
CO2: 20 mmol/L (ref 20–32)
Calcium: 8.2 mg/dL — ABNORMAL LOW (ref 8.6–10.3)
Chloride: 113 mmol/L — ABNORMAL HIGH (ref 98–110)
Creat: 1.84 mg/dL — ABNORMAL HIGH (ref 0.70–1.18)
GFR, Est African American: 40 mL/min/{1.73_m2} — ABNORMAL LOW (ref >=60)
GFR, Est Non African American: 34 mL/min/{1.73_m2} — ABNORMAL LOW (ref >=60)
Globulin: 2.5 g/dL (calc) (ref 1.9–3.7)
Glucose, Bld: 111 mg/dL — ABNORMAL HIGH (ref 65–99)
Potassium: 3.9 mmol/L (ref 3.5–5.3)
Sodium: 143 mmol/L (ref 135–146)
Total Bilirubin: 1 mg/dL (ref 0.2–1.2)
Total Protein: 5.9 g/dL — ABNORMAL LOW (ref 6.1–8.1)

## 2020-05-11 LAB — LIPID PANEL
Cholesterol: 142 mg/dL (ref <200)
HDL: 26 mg/dL — ABNORMAL LOW (ref >=40)
LDL Cholesterol (Calc): 85 mg/dL (calc)
Non-HDL Cholesterol (Calc): 116 mg/dL (calc) (ref <130)
Total CHOL/HDL Ratio: 5.5 (calc) — ABNORMAL HIGH (ref <5.0)
Triglycerides: 222 mg/dL — ABNORMAL HIGH (ref <150)

## 2020-06-12 ENCOUNTER — Ambulatory Visit: Payer: Medicare Other | Admitting: Cardiology

## 2020-06-12 NOTE — Progress Notes (Signed)
No show

## 2020-07-18 ENCOUNTER — Ambulatory Visit: Payer: Self-pay | Admitting: Cardiology

## 2020-07-24 ENCOUNTER — Other Ambulatory Visit: Payer: Self-pay

## 2020-07-24 ENCOUNTER — Encounter: Payer: Self-pay | Admitting: Cardiology

## 2020-07-24 ENCOUNTER — Ambulatory Visit: Payer: Medicare Other | Admitting: Cardiology

## 2020-07-24 VITALS — BP 131/75 | HR 64 | Temp 98.3°F | Resp 16 | Ht 65.0 in | Wt 162.0 lb

## 2020-07-24 DIAGNOSIS — R0600 Dyspnea, unspecified: Secondary | ICD-10-CM

## 2020-07-24 DIAGNOSIS — I10 Essential (primary) hypertension: Secondary | ICD-10-CM

## 2020-07-24 DIAGNOSIS — R0609 Other forms of dyspnea: Secondary | ICD-10-CM

## 2020-07-24 NOTE — Progress Notes (Signed)
Patient referred by Nolene Ebbs, MD for aortic valve disorder  Subjective:   Ryan Burke, male    DOB: 30-Mar-1941, 80 y.o.   MRN: [id number]   Chief Complaint  Patient presents with  . Exertional dyspnea  . Atrial Fibrillation  . Hypertension  . Follow-up    6 month    80 y.o. 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  Patient is here today with his daughter-in-law, who helped with interpretation.  Patient continues to have 2 L oxygen requirement since having had COVID. He denies any chest pain. He ambulates at home using walker or wheelchair.   Current Outpatient Medications on File Prior to Visit  Medication Sig Dispense Refill  . albuterol (PROVENTIL HFA;VENTOLIN HFA) 108 (90 BASE) MCG/ACT inhaler Inhale 2 puffs into the lungs every 6 (six) hours as needed. For shortness of breath    . aspirin EC 81 MG tablet Take 81 mg by mouth daily.    . cetirizine (ZYRTEC) 10 MG tablet Take 10 mg by mouth daily as needed.    . colchicine 0.6 MG tablet Take 0.6 mg by mouth 2 (two) times daily as needed (gout).    Marland Kitchen doxazosin (CARDURA) 4 MG tablet Take 4 mg by mouth daily.    Marland Kitchen FARXIGA 5 MG TABS tablet Take 5 mg by mouth daily.    Marland Kitchen JANUVIA 50 MG tablet Take 50 mg by mouth daily.    . metoprolol tartrate (LOPRESSOR) 25 MG tablet Take 1 tablet (25 mg total) by mouth 2 (two) times daily. 60 tablet 1  . omeprazole (PRILOSEC) 20 MG capsule Take 20 mg by mouth 2 (two) times daily.     Marland Kitchen oxyCODONE (ROXICODONE) 5 MG immediate release tablet Take 1 tablet (5 mg total) by mouth every 6 (six) hours as needed for severe pain. 20 tablet 0  . pravastatin (PRAVACHOL) 20 MG tablet Take 20 mg by mouth every evening.    . sodium bicarbonate 650 MG tablet Take 1 tablet (650 mg total) by mouth 2 (two) times daily. 60 tablet 0  . nitroGLYCERIN (NITROSTAT) 0.4 MG SL tablet Place 0.4 mg under the tongue every 5 (five) minutes as needed for chest  pain.    . SYMBICORT 160-4.5 MCG/ACT inhaler SMARTSIG:2 Puff(s) By Mouth Twice Daily     No current facility-administered medications on file prior to visit.    Cardiovascular and other pertinent studies:  EKG 07/24/2020: Sinus rhythm 61 bpm Left ventricular hypertrophy  Echocardiogram 12/27/2019:  Normal LV systolic function with visual EF 50-55%. Left ventricle cavity  is normal in size. Normal global wall motion.  Normal diastolic filling pattern, normal LAP.  Bioprosthetic aortic valve well seated, without perivalvular  regurgitation. No valvular stenosis or regurgitation.  Mild (Grade I) mitral regurgitation.  Mild tricuspid regurgitation.  Mild pulmonic regurgitation.  Compared to prior study dated 2016 no significant change.   EKG 12/13/2019:   Sinus rhythm 64 bpm Normal EKG   Recent labs: 05/10/2020: Glucose 111, BUN/Cr 18/1.84. EGFR 34. Na/K 143/3.9. AST 36. ALKP 161.  Total protein 5.9. Rest of the CMP normal H/H 13/40. MCV 84. Platelets 110 HbA1C N/A Chol 142, TG 222, HDL 26, LDL 85 TSH 1.9 normal  02/27/2019: Glucose 137, BUN/Cr 15/1.89. EGFR 33. Na/K 133/4.7. Mg 1.4 H/H 11.3/33.7. MCV 85.1. Platelets 110    Review of Systems  Cardiovascular: Positive for dyspnea on exertion. Negative for chest pain,  leg swelling, palpitations and syncope.         Vitals:   07/24/20 1441  BP: 131/75  Pulse: 64  Resp: 16  Temp: 98.3 F (36.8 C)  SpO2: 99%     Body mass index is 26.96 kg/m. Filed Weights   07/24/20 1441  Weight: 162 lb (73.5 kg)     Objective:   Physical Exam Vitals and nursing note reviewed.  Constitutional:      General: He is not in acute distress. Neck:     Vascular: No JVD.  Cardiovascular:     Rate and Rhythm: Normal rate and regular rhythm.     Heart sounds: Murmur heard.  High-pitched blowing holosystolic murmur is present with a grade of 2/6 at the apex.   Pulmonary:     Effort: Pulmonary effort is normal.     Breath  sounds: Normal breath sounds. No wheezing or rales.  Musculoskeletal:     Right lower leg: No edema.     Left lower leg: No edema.          Assessment & Recommendations:   80 y.o. 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  Exertional dyspnea: Most likely related to post Covid symptoms. No chest pain to suggest angina. Echocardiogram shows stable prosthetic aortic valve function. I recommend pulmonary evaluation.  Hypertension: Controlled  I will see him on as needed basis   Nigel Mormon, MD Pager: [phone] Office: [phone].

## 2020-10-14 ENCOUNTER — Other Ambulatory Visit: Payer: Self-pay

## 2020-10-14 ENCOUNTER — Encounter: Payer: Self-pay | Admitting: Pulmonary Disease

## 2020-10-14 ENCOUNTER — Ambulatory Visit (INDEPENDENT_AMBULATORY_CARE_PROVIDER_SITE_OTHER): Payer: Medicare Other | Admitting: Pulmonary Disease

## 2020-10-14 VITALS — BP 120/80 | HR 96 | Ht 65.0 in | Wt 152.0 lb

## 2020-10-14 DIAGNOSIS — R0609 Other forms of dyspnea: Secondary | ICD-10-CM

## 2020-10-14 DIAGNOSIS — U099 Post covid-19 condition, unspecified: Secondary | ICD-10-CM | POA: Diagnosis not present

## 2020-10-14 DIAGNOSIS — R29898 Other symptoms and signs involving the musculoskeletal system: Secondary | ICD-10-CM

## 2020-10-14 MED ORDER — ALBUTEROL SULFATE HFA 108 (90 BASE) MCG/ACT IN AERS: 2.0000 | INHALATION_SPRAY | Freq: Four times a day (QID) | RESPIRATORY_TRACT | 2 refills | Status: AC | PRN

## 2020-10-14 MED ORDER — BUDESONIDE-FORMOTEROL FUMARATE 160-4.5 MCG/ACT IN AERO: 2.0000 | INHALATION_SPRAY | Freq: Two times a day (BID) | RESPIRATORY_TRACT | 6 refills | Status: AC

## 2020-10-14 NOTE — Progress Notes (Signed)
Subjective:   PATIENT ID: Ryan Burke GENDER: male DOB: [birth year], MRN: [id number]   HPI  Chief Complaint  Patient presents with   Consult    hypoxia    Reason for Visit: New consult for shortness of breath  [name] is a 80 year old male with atrial fibrillation, HTN, s/p AVR 2015 who presents for exertional dyspnea.  Referred from Cardiology for shortness of breath. He will have dyspnea with any activity including standing, lifting and walking. Rarely coughs. Denies wheezing.   He was diagnosed with COVID-19 pneumonia in 02/2018 requiring hospitalization for two weeks. Since then he has required supplemental oxygen which he wears with activity and nights. He has remained very sedentary after his illness. Before this he used to walk daily.  He uses albuterol three times a day usually before activity.   He smoked from 62 to 16 years old. He smoked 3/4 ppd.   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  Past Medical History:  Diagnosis Date   Aortic regurgitation    Asthma    Chest pain at rest    Chronic bronchitis ([facility])    Chronic kidney disease    Diabetes mellitus without complication ([facility])    "has had it before; he's ok now; not on RC anymore" (08/27/2016)   GERD (gastroesophageal reflux disease)    Gout    Heart murmur    Hyperlipidemia    Hypertension    Mitral regurgitation    Seizures ([facility]) 03/2013 X 1   "maybe because of a high fever"   Shortness of breath    "w/activity" (08/27/2016)     Family History  Problem Relation Age of Onset   Heart disease Sister      Social History   Occupational History   Not on file  Tobacco Use   Smoking status: Former    Packs/day: 0.50    Years: 4.00    Pack years: 2.00    Types: Cigarettes    Start date: 1961    Quit date: 03/30/2001    Years since quitting: 19.5   Smokeless tobacco: Former     Types: Associate Professor Use: Never used  Substance and Sexual Activity   Alcohol use: Yes    Alcohol/week: 1.0 standard drink    Types: 1 Cans of beer per week   Drug use: No   Sexual activity: Not on file    No Known Allergies   Outpatient Medications Prior to Visit  Medication Sig Dispense Refill   albuterol (PROVENTIL HFA;VENTOLIN HFA) 108 (90 BASE) MCG/ACT inhaler Inhale 2 puffs into the lungs every 6 (six) hours as needed. For shortness of breath     aspirin EC 81 MG tablet Take 81 mg by mouth daily.     cetirizine (ZYRTEC) 10 MG tablet Take 10 mg by mouth daily as needed.     colchicine 0.6 MG tablet Take 0.6 mg by mouth 2 (two) times daily as needed (gout).     doxazosin (CARDURA) 4 MG tablet Take 4 mg by mouth daily.     FARXIGA 5 MG TABS tablet Take 5 mg by mouth daily.     JANUVIA 50 MG tablet Take 50 mg by mouth daily.     metoprolol tartrate (LOPRESSOR) 25 MG tablet Take 1 tablet (25 mg total) by mouth 2 (two) times daily. 60 tablet 1   nitroGLYCERIN (NITROSTAT)  0.4 MG SL tablet Place 0.4 mg under the tongue every 5 (five) minutes as needed for chest pain.     omeprazole (PRILOSEC) 20 MG capsule Take 20 mg by mouth 2 (two) times daily.      oxyCODONE (ROXICODONE) 5 MG immediate release tablet Take 1 tablet (5 mg total) by mouth every 6 (six) hours as needed for severe pain. 20 tablet 0   pravastatin (PRAVACHOL) 20 MG tablet Take 20 mg by mouth every evening.     sodium bicarbonate 650 MG tablet Take 1 tablet (650 mg total) by mouth 2 (two) times daily. 60 tablet 0   SYMBICORT 160-4.5 MCG/ACT inhaler SMARTSIG:2 Puff(s) By Mouth Twice Daily     No facility-administered medications prior to visit.    Review of Systems  Constitutional:  Positive for malaise/fatigue. Negative for chills, diaphoresis, fever and weight loss.  HENT:  Negative for congestion, ear pain and sore throat.   Respiratory:  Positive for cough and shortness of breath. Negative for  hemoptysis, sputum production and wheezing.   Cardiovascular:  Negative for chest pain, palpitations and leg swelling.  Gastrointestinal:  Negative for abdominal pain, heartburn and nausea.  Genitourinary:  Negative for frequency.  Musculoskeletal:  Negative for joint pain and myalgias.  Skin:  Negative for itching and rash.  Neurological:  Negative for dizziness, weakness and headaches.  Endo/Heme/Allergies:  Does not bruise/bleed easily.  Psychiatric/Behavioral:  Negative for depression. The patient is not nervous/anxious.     Objective:   Vitals:   10/14/20 1545  BP: 120/80  Pulse: 96  SpO2: 99%  Weight: 152 lb (68.9 kg)  Height: 5\' 5"  (1.651 m)   SpO2: 99 % (2L)  Physical Exam: General: Well-appearing, no acute distress HENT: [place], AT Eyes: EOMI, no scleral icterus Respiratory: Clear to auscultation bilaterally.  No crackles, wheezing or rales Cardiovascular: RRR, -M/R/G, no JVD GI: BS+, soft, nontender Extremities:-Edema,-tenderness Neuro: AAO x4, CNII-XII grossly intact Skin: Intact, no rashes or bruising Psych: Normal mood, normal affect  Data Reviewed:  Imaging: CXR 02/06/19 - Chronic interstitial changes. Bilateral ground glass opacities  PFT: 05/02/12 DLCO 45% Interpretation: Normal spirometry and lung volumes. 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 Meds ordered this encounter  Medications   budesonide-formoterol (SYMBICORT) 160-4.5 MCG/ACT inhaler    Sig: Inhale 2 puffs into the lungs in the morning and at bedtime.    Dispense:  1 each    Refill:  6   albuterol (VENTOLIN HFA) 108 (90 Base) MCG/ACT inhaler    Sig: Inhale 2 puffs into the lungs every 6 (six) hours as needed for wheezing or shortness of breath.    Dispense:  8 g    Refill:  2    Return in about 2 months (around 12/15/2020).  I have spent a total time of 45-minutes on the day of the appointment reviewing prior documentation, coordinating care and  discussing medical diagnosis and plan with the patient/family. Imaging, labs and tests included in this note have been reviewed and interpreted independently by me.  Irys Nigh Mechele Collin, MD [facility] 10/14/2020 4:06 PM  Office Number [phone]

## 2020-10-14 NOTE — Patient Instructions (Signed)
COVID-19 Long Hauler manifesting as shortness of breath --START Symbicort 160-4.5 mcg TWO puffs TWICE a day --START Albuterol AS NEEDED for shortness of breath --START exercising daily with small walks around the house  Chronic hypoxemic respiratory failure --Wear oxygen for goal >90%  When you are ready we can refer to Pulmonary Rehab to help with your strength  Follow-up with me in 2 months .

## 2020-10-23 ENCOUNTER — Emergency Department (HOSPITAL_COMMUNITY): Payer: Medicare Other

## 2020-10-23 ENCOUNTER — Encounter (HOSPITAL_COMMUNITY): Payer: Self-pay | Admitting: Emergency Medicine

## 2020-10-23 ENCOUNTER — Emergency Department (HOSPITAL_COMMUNITY)
Admission: EM | Admit: 2020-10-23 | Discharge: 2020-10-24 | Disposition: A | Discharge status: Home / Self Care | Payer: Medicare Other | Attending: Emergency Medicine | Admitting: Emergency Medicine

## 2020-10-23 DIAGNOSIS — E119 Type 2 diabetes mellitus without complications: Secondary | ICD-10-CM | POA: Diagnosis not present

## 2020-10-23 DIAGNOSIS — Z7982 Long term (current) use of aspirin: Secondary | ICD-10-CM | POA: Insufficient documentation

## 2020-10-23 DIAGNOSIS — J189 Pneumonia, unspecified organism: Secondary | ICD-10-CM | POA: Diagnosis not present

## 2020-10-23 DIAGNOSIS — Z79899 Other long term (current) drug therapy: Secondary | ICD-10-CM | POA: Insufficient documentation

## 2020-10-23 DIAGNOSIS — Z20822 Contact with and (suspected) exposure to covid-19: Secondary | ICD-10-CM | POA: Insufficient documentation

## 2020-10-23 DIAGNOSIS — N179 Acute kidney failure, unspecified: Secondary | ICD-10-CM | POA: Diagnosis not present

## 2020-10-23 DIAGNOSIS — Z87891 Personal history of nicotine dependence: Secondary | ICD-10-CM | POA: Diagnosis not present

## 2020-10-23 DIAGNOSIS — J9611 Chronic respiratory failure with hypoxia: Secondary | ICD-10-CM | POA: Diagnosis not present

## 2020-10-23 DIAGNOSIS — Z7951 Long term (current) use of inhaled steroids: Secondary | ICD-10-CM | POA: Diagnosis not present

## 2020-10-23 DIAGNOSIS — R0602 Shortness of breath: Secondary | ICD-10-CM | POA: Diagnosis present

## 2020-10-23 DIAGNOSIS — I10 Essential (primary) hypertension: Secondary | ICD-10-CM | POA: Insufficient documentation

## 2020-10-23 DIAGNOSIS — Z8616 Personal history of COVID-19: Secondary | ICD-10-CM | POA: Insufficient documentation

## 2020-10-23 DIAGNOSIS — J45909 Unspecified asthma, uncomplicated: Secondary | ICD-10-CM | POA: Diagnosis not present

## 2020-10-23 LAB — COMPREHENSIVE METABOLIC PANEL
ALT: 23 U/L (ref 0–44)
AST: 34 U/L (ref 15–41)
Albumin: 2.5 g/dL — ABNORMAL LOW (ref 3.5–5.0)
Alkaline Phosphatase: 153 U/L — ABNORMAL HIGH (ref 38–126)
Anion gap: 8 (ref 5–15)
BUN: 13 mg/dL (ref 8–23)
CO2: 22 mmol/L (ref 22–32)
Calcium: 7.9 mg/dL — ABNORMAL LOW (ref 8.9–10.3)
Chloride: 104 mmol/L (ref 98–111)
Creatinine, Ser: 2.32 mg/dL — ABNORMAL HIGH (ref 0.61–1.24)
GFR, Estimated: 28 mL/min — ABNORMAL LOW (ref >60)
Glucose, Bld: 163 mg/dL — ABNORMAL HIGH (ref 70–99)
Potassium: 3.5 mmol/L (ref 3.5–5.1)
Sodium: 134 mmol/L — ABNORMAL LOW (ref 135–145)
Total Bilirubin: 2.5 mg/dL — ABNORMAL HIGH (ref 0.3–1.2)
Total Protein: 6 g/dL — ABNORMAL LOW (ref 6.5–8.1)

## 2020-10-23 LAB — CBC WITH DIFFERENTIAL/PLATELET
Abs Immature Granulocytes: 0.02 10*3/uL (ref 0.00–0.07)
Basophils Absolute: 0 10*3/uL (ref 0.0–0.1)
Basophils Relative: 0 %
Eosinophils Absolute: 0.2 10*3/uL (ref 0.0–0.5)
Eosinophils Relative: 3 %
HCT: 38.9 % — ABNORMAL LOW (ref 39.0–52.0)
Hemoglobin: 12.7 g/dL — ABNORMAL LOW (ref 13.0–17.0)
Immature Granulocytes: 0 %
Lymphocytes Relative: 23 %
Lymphs Abs: 1.4 10*3/uL (ref 0.7–4.0)
MCH: 27.9 pg (ref 26.0–34.0)
MCHC: 32.6 g/dL (ref 30.0–36.0)
MCV: 85.5 fL (ref 80.0–100.0)
Monocytes Absolute: 0.5 10*3/uL (ref 0.1–1.0)
Monocytes Relative: 8 %
Neutro Abs: 3.9 10*3/uL (ref 1.7–7.7)
Neutrophils Relative %: 66 %
Platelets: 85 10*3/uL — ABNORMAL LOW (ref 150–400)
RBC: 4.55 MIL/uL (ref 4.22–5.81)
RDW: 17.1 % — ABNORMAL HIGH (ref 11.5–15.5)
Smear Review: NORMAL
WBC: 6 10*3/uL (ref 4.0–10.5)
nRBC: 0 % (ref 0.0–0.2)

## 2020-10-23 LAB — RESP PANEL BY RT-PCR (FLU A&B, COVID) ARPGX2
Influenza A by PCR: NEGATIVE
Influenza B by PCR: NEGATIVE
SARS Coronavirus 2 by RT PCR: NEGATIVE

## 2020-10-23 LAB — BRAIN NATRIURETIC PEPTIDE: B Natriuretic Peptide: 139.5 pg/mL — ABNORMAL HIGH (ref 0.0–100.0)

## 2020-10-23 MED ORDER — ALBUTEROL SULFATE HFA 108 (90 BASE) MCG/ACT IN AERS: 2.0000 | INHALATION_SPRAY | Freq: Once | RESPIRATORY_TRACT | Status: DC

## 2020-10-23 NOTE — ED Provider Notes (Signed)
[facility] EMERGENCY DEPARTMENT Provider Note   CSN: [id number] Arrival date & time: 10/23/20  1205     History Chief Complaint  Patient presents with   Cough   Fever    Ryan Burke is a 80 y.o. male.  The history is provided by the patient and a relative. Language interpreter used: patient speaks nepali, history provided by Daughter in Social worker.  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  Patient has had fever over 100 in the past day.  Reports increased cough without hemoptysis.  Also reports some lower extremity edema. He has not had any active chest pain  Past Medical History:  Diagnosis Date   Aortic regurgitation    Asthma    Chest pain at rest    Chronic bronchitis (HCC)    Chronic kidney disease    Diabetes mellitus without complication (HCC)    "has had it before; he's ok now; not on RC anymore" (08/27/2016)   GERD (gastroesophageal reflux disease)    Gout    Heart murmur    Hyperlipidemia    Hypertension    Mitral regurgitation    Seizures (HCC) 03/2013 X 1   "maybe because of a high fever"   Shortness of breath    "w/activity" (08/27/2016)    Patient Active Problem List   Diagnosis Date Noted   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   Exertional dyspnea 12/13/2019   Acute respiratory disease due to COVID-19 virus 02/06/2019   Acute bronchitis 08/27/2016   Seizures (HCC) 04/19/2013   Gout 04/19/2013   S/P AVR 05/08/2012   Atrial  fibrillation (HCC) 05/08/2012   Hyperlipidemia    Aortic regurgitation    Mitral regurgitation    ELEVATED PROSTATE SPECIFIC ANTIGEN 09/24/2009   PROTEINURIA 09/20/2009   AORTIC INSUFFICIENCY, MODERATE 08/02/2009   HYPERGLYCEMIA 07/24/2009   LIVER FUNCTION TESTS, ABNORMAL, HX OF 07/24/2009   Essential hypertension, benign 07/22/2009    Past Surgical History:  Procedure Laterality Date   ABDOMINAL AORTAGRAM N/A 04/12/2012   Procedure: ABDOMINAL Ronny Flurry;  Surgeon: Pamella Pert, MD;  Location: [facility] CATH LAB;  Service: Cardiovascular;  Laterality: N/A;   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   Procedure: AORTIC VALVE REPLACEMENT (AVR);  Surgeon: [name], MD;  Location: [facility] OR;  Service: Open Heart Surgery;  Laterality: N/A;   CARDIAC CATHETERIZATION     CARDIAC VALVE REPLACEMENT     CATARACT EXTRACTION W/ INTRAOCULAR LENS  IMPLANT, BILATERAL Bilateral    INTRAOPERATIVE TRANSESOPHAGEAL ECHOCARDIOGRAM  05/04/2012   Procedure: INTRAOPERATIVE TRANSESOPHAGEAL ECHOCARDIOGRAM;  Surgeon: [name], MD;  Location: [facility] OR;  Service: Open Heart Surgery;  Laterality: N/A;   LEFT AND RIGHT HEART CATHETERIZATION WITH CORONARY ANGIOGRAM N/A 04/12/2012   Procedure: LEFT AND RIGHT HEART CATHETERIZATION WITH CORONARY ANGIOGRAM;  Surgeon: Pamella Pert, MD;  Location: [facility] CATH LAB;  Service: Cardiovascular;  Laterality: N/A;       Family History  Problem Relation Age of Onset   Heart disease Sister     Social  History   Tobacco Use   Smoking status: Former    Packs/day: 0.75    Years: 42.00    Pack years: 31.50    Types: Cigarettes    Start date: 1961    Quit date: 03/30/2001    Years since quitting: 19.5   Smokeless tobacco: Former    Types: Associate ProfessorChew  Vaping Use   Vaping Use: Never used  Substance Use Topics   Alcohol use: Yes    Alcohol/week: 1.0 standard drink    Types: 1 Cans of beer per week   Drug use: No    Home Medications Prior to Admission medications   Medication Sig  Start Date End Date Taking? Authorizing Provider  cefdinir (OMNICEF) 300 MG capsule Take 1 capsule (300 mg total) by mouth daily. 10/24/20  Yes Zadie RhineWickline, Iyani Dresner, MD  doxycycline (VIBRAMYCIN) 100 MG capsule Take 1 capsule (100 mg total) by mouth 2 (two) times daily. One po bid x 7 days 10/24/20  Yes Zadie RhineWickline, Tasnim Balentine, MD  albuterol (VENTOLIN HFA) 108 (90 Base) MCG/ACT inhaler Inhale 2 puffs into the lungs every 6 (six) hours as needed for wheezing or shortness of breath. 10/14/20   Luciano CutterEllison, Chi Jane, MD  aspirin EC 81 MG tablet Take 81 mg by mouth daily.    [provider]  budesonide-formoterol (SYMBICORT) 160-4.5 MCG/ACT inhaler Inhale 2 puffs into the lungs in the morning and at bedtime. 10/14/20   Luciano CutterEllison, Chi Jane, MD  cetirizine (ZYRTEC) 10 MG tablet Take 10 mg by mouth daily as needed. 12/29/18   [provider]  colchicine 0.6 MG tablet Take 0.6 mg by mouth 2 (two) times daily as needed (gout).    [provider]  doxazosin (CARDURA) 4 MG tablet Take 4 mg by mouth daily.    [provider]  FARXIGA 5 MG TABS tablet Take 5 mg by mouth daily. 12/22/18   [provider]  JANUVIA 50 MG tablet Take 50 mg by mouth daily. 02/01/19   [provider]  metoprolol tartrate (LOPRESSOR) 25 MG tablet Take 1 tablet (25 mg total) by mouth 2 (two) times daily. 05/08/12   Barrett, Erin R, PA-C  nitroGLYCERIN (NITROSTAT) 0.4 MG SL tablet Place 0.4 mg under the tongue every 5 (five) minutes as needed for chest pain.    [provider]  omeprazole (PRILOSEC) 20 MG capsule Take 20 mg by mouth 2 (two) times daily.     [provider]  oxyCODONE (ROXICODONE) 5 MG immediate release tablet Take 1 tablet (5 mg total) by mouth every 6 (six) hours as needed for severe pain. 04/17/13   Gwyneth SproutPlunkett, Whitney, MD  pravastatin (PRAVACHOL) 20 MG tablet Take 20 mg by mouth every evening.    [provider]  sodium bicarbonate 650 MG tablet Take 1 tablet (650 mg  total) by mouth 2 (two) times daily. 02/14/19   Ghimire, Werner LeanShanker M, MD    Allergies    Patient has no known allergies.  Review of Systems   Review of Systems  Constitutional:  Positive for fatigue and fever.  Respiratory:  Positive for cough and shortness of breath.   Cardiovascular:  Positive for leg swelling. Negative for chest pain.  All other systems reviewed and are negative.  Physical Exam Updated Vital Signs BP 121/80 (BP Location: Left Arm)   Pulse 77   Temp 98.5 F (36.9 C) (Oral)   Resp 16   SpO2 93%   Physical Exam CONSTITUTIONAL: Elderly, ill-appearing HEAD: Normocephalic/atraumatic EYES: EOMI/PERRL NECK:  supple no meningeal signs SPINE/BACK:entire spine nontender CV: S1/S2 noted, no murmurs/rubs/gallops noted LUNGS: Tachypnea, crackles bilaterally, on supplemental oxygen ABDOMEN: soft, nontender, no rebound or guarding, bowel sounds noted throughout abdomen GU:no cva tenderness NEURO: Pt is awake/alert/appropriate, moves all extremitiesx4.  No facial droop.   EXTREMITIES: pulses normal/equal, full ROM, mild symmetric lower extremity edema SKIN: warm, color normal PSYCH: no abnormalities of mood noted, alert and oriented to situation  ED Results / Procedures / Treatments   Labs (all labs ordered are listed, but only abnormal results are displayed) Labs Reviewed  COMPREHENSIVE METABOLIC PANEL - Abnormal; Notable for the following components:      Result Value   Sodium 134 (*)    Glucose, Bld 163 (*)    Creatinine, Ser 2.32 (*)    Calcium 7.9 (*)    Total Protein 6.0 (*)    Albumin 2.5 (*)    Alkaline Phosphatase 153 (*)    Total Bilirubin 2.5 (*)    GFR, Estimated 28 (*)    All other components within normal limits  CBC WITH DIFFERENTIAL/PLATELET - Abnormal; Notable for the following components:   Hemoglobin 12.7 (*)    HCT 38.9 (*)    RDW 17.1 (*)    Platelets 85 (*)    All other components within normal limits  BRAIN NATRIURETIC PEPTIDE -  Abnormal; Notable for the following components:   B Natriuretic Peptide 139.5 (*)    All other components within normal limits  RESP PANEL BY RT-PCR (FLU A&B, COVID) [id number]    EKG EKG Interpretation  Date/Time:  Wednesday October 23 2020 13:29:32 EDT Ventricular Rate:  66 PR Interval:  166 QRS Duration: 102 QT Interval:  406 QTC Calculation: 425 R Axis:   9 Text Interpretation: Normal sinus rhythm Moderate voltage criteria for LVH, may be normal variant ( R in aVL , Cornell product ) Possible Inferior infarct , age undetermined Abnormal ECG Confirmed by Zadie Rhine ([id number]) on 10/23/2020 11:13:30 PM  Radiology DG Chest 2 View  Result Date: 10/23/2020 CLINICAL DATA:  Cough, fever EXAM: CHEST - 2 VIEW COMPARISON:  02/06/2019. FINDINGS: Severe diffuse chronic interstitial opacities throughout the lungs, right greater than left. This is similar to prior study and likely reflects advanced interstitial lung disease. Heart is normal size. Prior valve replacement. No effusions or pneumothorax. No acute bony abnormality. IMPRESSION: Stable severe chronic interstitial lung disease. No definite acute process. Electronically Signed   By: Charlett Nose M.D.   On: 10/23/2020 15:32    Procedures Procedures   Medications Ordered in ED Medications  doxycycline (VIBRA-TABS) tablet 100 mg (has no administration in time range)    ED Course  I have reviewed the triage vital signs and the nursing notes.  Pertinent labs & imaging results that were available during my care of the patient were reviewed by me and considered in my medical decision making (see chart for details).    MDM Rules/Calculators/A&P                           Patient with history of chronic respiratory failure on home oxygen, it appears that most of his issues were due to a previous COVID-19 infection Has not had recent cough and fever and increased shortness of breath at home Patient and family's main concern was COVID  reinfection. Fortunately COVID-19 testing was negative.  On review, x-ray that was personally reviewed by myself revealed significant interstitial lung disease.  However he  is likely developing pneumonia given his history and symptoms. He was able to ambulate in the ER but did become mildly short of breath and hypoxic on his home oxygen. I had a long discussion with patient and his daughter-in-law.  I gave the option for admission for respiratory therapy/treatment and IV antibiotics. Patient prefers to be discharged.  At rest he is in no acute distress at this time. He does have acute worsening renal failure. Plan will be to discharge home on dual antibiotic therapy (discussed with pharmacy and received the recommendation) He will need PCP follow-up in 1 week for reevaluation and recheck of his creatinine. Discussed strict  ER return precautions. Final Clinical Impression(s) / ED Diagnoses Final diagnoses:  Chronic respiratory failure with hypoxia (HCC)  Community acquired pneumonia, unspecified laterality  AKI (acute kidney injury) (HCC)    Rx / DC Orders ED Discharge Orders          Ordered    cefdinir (OMNICEF) 300 MG capsule  Daily        10/24/20 0133    doxycycline (VIBRAMYCIN) 100 MG capsule  2 times daily        10/24/20 0133             Zadie Rhine, MD 10/24/20 [phone]

## 2020-10-23 NOTE — ED Triage Notes (Signed)
Pt arrives with family who reports pt was having fever and cough since yesterday. Last had tylenol yesterday. Pt denies any pain. Wears 2L since having covid last year. Pt and family worried about PNA.

## 2020-10-23 NOTE — ED Provider Notes (Signed)
Emergency Medicine Provider Triage Evaluation Note  Ryan Burke , a 80 y.o. male  was evaluated in triage.  Pt complains of cough and fever.  HPI was collected using his daughter as a Nurse, learning disability, daughter states that patient had a fever for the last couple of days, started having cough today, cough is nonproductive, he admits to diarrhea but denies hematochezia or melena, he denies abdominal pain, nausea or vomiting, denies general body aches.  He is up-to-date on his COVID-vaccine, denies recent sick contacts.  He denies chest pain, shortness of breath, worsening pedal edema, he is on 2 L via nasal cannula at baseline.  No new oxygen requirements..  Review of Systems  Positive: Cough, fever Negative: Chest pain, shortness of breath  Physical Exam  BP 125/78 (BP Location: Left Arm)   Pulse 65   Temp 99.1 F (37.3 C) (Oral)   Resp 18   SpO2 98%  Gen:   Awake, no distress   Resp:  Normal effort patient has notable bibasilar Rales in the lower mid lungs bilaterally no new oxygen requirements. MSK:   Moves extremities without difficulty  Other:    Medical Decision Making  Medically screening exam initiated at 1:28 PM.  Appropriate orders placed.  Deen B Kusek was informed that the remainder of the evaluation will be completed by another provider, this initial triage assessment does not replace that evaluation, and the importance of remaining in the ED until their evaluation is complete.  Presents with fevers, cough patient will need further work-up.   Carroll Sage, PA-C 10/23/20 1331    Zadie Rhine, MD 10/23/20 [phone]

## 2020-10-24 DIAGNOSIS — J189 Pneumonia, unspecified organism: Secondary | ICD-10-CM | POA: Diagnosis not present

## 2020-10-24 MED ORDER — DOXYCYCLINE HYCLATE 100 MG PO CAPS: 100.0000 mg | ORAL_CAPSULE | Freq: Two times a day (BID) | ORAL | 0 refills | Status: DC

## 2020-10-24 MED ORDER — CEFDINIR 300 MG PO CAPS
300.0000 mg | ORAL_CAPSULE | Freq: Every day | ORAL | 0 refills | Status: DC
Start: 2020-10-24 — End: 2021-02-14

## 2020-10-24 MED ORDER — DOXYCYCLINE HYCLATE 100 MG PO TABS
100.0000 mg | ORAL_TABLET | Freq: Once | ORAL | Status: AC
  Administered 2020-10-24: 100 mg via ORAL
  Filled 2020-10-24: qty 1

## 2020-10-24 NOTE — ED Notes (Signed)
DC instruction reviewed with pt.  PT verbalzied understanding. Pt DC.

## 2020-10-24 NOTE — Discharge Instructions (Addendum)
He will take 2 antibiotics for 1 week. One of the antibiotics will be just once a day (omnicef/cefdinir) The other antibiotic will be twice a day (doxy) Please encourage him to continue his home breathing treatments Please have him see Dr. Concepcion Elk in 1 week and to be sure to have his kidney function rechecked If he appears to be worsening at all with his breathing in the next week, please call [phone] immediately

## 2020-10-24 NOTE — ED Notes (Signed)
Pt walks in hall. Uses cane and assistance x1. Remains on [place] @ 2LPM.. O2 saturation drops to 87%. O2 saturation returns to 92% after several minutes of sitting. Appears winded and fatigued.

## 2020-11-13 ENCOUNTER — Encounter: Payer: Self-pay | Admitting: Pulmonary Disease

## 2020-11-15 ENCOUNTER — Other Ambulatory Visit: Payer: Self-pay | Admitting: Internal Medicine

## 2020-11-16 LAB — BASIC METABOLIC PANEL WITH GFR
BUN/Creatinine Ratio: 7 (calc) (ref 6–22)
BUN: 15 mg/dL (ref 7–25)
CO2: 22 mmol/L (ref 20–32)
Calcium: 7.8 mg/dL — ABNORMAL LOW (ref 8.6–10.3)
Chloride: 112 mmol/L — ABNORMAL HIGH (ref 98–110)
Creat: 2.1 mg/dL — ABNORMAL HIGH (ref 0.70–1.28)
Glucose, Bld: 100 mg/dL — ABNORMAL HIGH (ref 65–99)
Potassium: 4.1 mmol/L (ref 3.5–5.3)
Sodium: 139 mmol/L (ref 135–146)
eGFR: 31 mL/min/{1.73_m2} — ABNORMAL LOW (ref >=60)

## 2020-11-16 LAB — CBC
HCT: 34.8 % — ABNORMAL LOW (ref 38.5–50.0)
Hemoglobin: 11.5 g/dL — ABNORMAL LOW (ref 13.2–17.1)
MCH: 28 pg (ref 27.0–33.0)
MCHC: 33 g/dL (ref 32.0–36.0)
MCV: 84.7 fL (ref 80.0–100.0)
MPV: 9.8 fL (ref 7.5–12.5)
Platelets: 63 10*3/uL — ABNORMAL LOW (ref 140–400)
RBC: 4.11 10*6/uL — ABNORMAL LOW (ref 4.20–5.80)
RDW: 15.7 % — ABNORMAL HIGH (ref 11.0–15.0)
WBC: 3.8 10*3/uL (ref 3.8–10.8)

## 2020-12-26 ENCOUNTER — Encounter: Payer: Self-pay | Admitting: Pulmonary Disease

## 2020-12-26 ENCOUNTER — Other Ambulatory Visit: Payer: Self-pay

## 2020-12-26 ENCOUNTER — Ambulatory Visit (INDEPENDENT_AMBULATORY_CARE_PROVIDER_SITE_OTHER): Payer: Medicare Other | Admitting: Pulmonary Disease

## 2020-12-26 VITALS — BP 122/82 | HR 58 | Temp 98.3°F | Ht 61.0 in | Wt 163.2 lb

## 2020-12-26 DIAGNOSIS — R0609 Other forms of dyspnea: Secondary | ICD-10-CM

## 2020-12-26 DIAGNOSIS — J9611 Chronic respiratory failure with hypoxia: Secondary | ICD-10-CM

## 2020-12-26 DIAGNOSIS — U099 Post covid-19 condition, unspecified: Secondary | ICD-10-CM

## 2020-12-26 DIAGNOSIS — J209 Acute bronchitis, unspecified: Secondary | ICD-10-CM | POA: Diagnosis not present

## 2020-12-26 MED ORDER — PREDNISONE 20 MG PO TABS: 40.0000 mg | ORAL_TABLET | Freq: Every day | ORAL | 0 refills | Status: AC

## 2020-12-26 NOTE — Patient Instructions (Addendum)
COVID-19 Long Hauler manifesting as shortness of breath Acute bronchitis --START Prednisone 40 mg x 5 days in the morning --START Mucinex-DM twice a day x 5 days as directed by package. Drink plenty of water --CONTINUE Symbicort 160-4.5 mcg TWO puffs TWICE a day --CONTINUE Albuterol AS NEEDED for shortness of breath --START exercising daily with small walks around the house  Chronic hypoxemic respiratory failure --Wear oxygen for goal >90%  Follow-up with me in 3 months

## 2020-12-26 NOTE — Progress Notes (Signed)
Subjective:   PATIENT ID: Ryan Burke GENDER: male DOB: 1940-12-08, MRN: [id number]   HPI  Chief Complaint  Patient presents with   Follow-up    Patient wears 2 liters oxygen all the time. Has a dry cough and makes his breathing feel a little worse.     Reason for Visit: Follow-up  [name] is a 80 year old male with atrial fibrillation, HTN, s/p AVR 2015 who presents for follow-up  Synopsis He was diagnosed with COVID-19 pneumonia in 02/2018 requiring hospitalization for two weeks. Since then he has required supplemental oxygen which he wears with activity and nights. He has remained very sedentary after his illness. He will have dyspnea with any activity including standing, lifting and walking. Rarely coughs. Denies wheezing. Before this he used to walk daily.  He uses albuterol three times a day usually before activity.   12/26/20 He continues to have chronic cough but has slightly worse in the last week. Unchanged sputum color and amount. Slightly more wheezing and shortness of breath. Denies sick contacts. He is compliant with his Symbicort and has been helping. He has needed to use his albuterol twice a day. He is compliant with his oxygen with activity and sleep. He attributes some of his worsening of symptoms due to change in weather.  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 He smoked 3/4 ppd.   Past Medical History:  Diagnosis Date   Aortic regurgitation    Asthma    Chest pain at rest    Chronic bronchitis (HCC)    Chronic kidney disease    Diabetes mellitus without complication (HCC)    "has had it before; he's ok now; not on RC anymore" (08/27/2016)   GERD (gastroesophageal reflux disease)    Gout    Heart murmur    Hyperlipidemia    Hypertension    Mitral regurgitation    Seizures (HCC) 03/2013 X 1   "maybe because of a high fever"   Shortness of breath    "w/activity" (08/27/2016)     Family History   Problem Relation Age of Onset   Heart disease Sister      Social History   Occupational History   Not on file  Tobacco Use   Smoking status: Former    Packs/day: 0.75    Years: 42.00    Pack years: 31.50    Types: Cigarettes    Start date: 1961    Quit date: 03/30/2001    Years since quitting: 19.7   Smokeless tobacco: Former    Types: Associate Professor Use: Never used  Substance and Sexual Activity   Alcohol use: Yes    Alcohol/week: 1.0 standard drink    Types: 1 Cans of beer per week   Drug use: No   Sexual activity: Not on file    No Known Allergies   Outpatient Medications Prior to Visit  Medication Sig Dispense Refill   albuterol (VENTOLIN HFA) 108 (90 Base) MCG/ACT inhaler Inhale 2 puffs into the lungs every 6 (six) hours as needed for wheezing or shortness of breath. 8 g 2   aspirin EC 81 MG tablet Take 81 mg by mouth daily.     budesonide-formoterol (SYMBICORT) 160-4.5 MCG/ACT inhaler Inhale 2 puffs into the lungs in the morning and at bedtime. 1 each 6   cefdinir (OMNICEF) 300 MG capsule Take 1 capsule (300 mg total) by mouth daily. 7  capsule 0   cetirizine (ZYRTEC) 10 MG tablet Take 10 mg by mouth daily as needed.     colchicine 0.6 MG tablet Take 0.6 mg by mouth 2 (two) times daily as needed (gout).     doxazosin (CARDURA) 4 MG tablet Take 4 mg by mouth daily.     doxycycline (VIBRAMYCIN) 100 MG capsule Take 1 capsule (100 mg total) by mouth 2 (two) times daily. One po bid x 7 days 14 capsule 0   FARXIGA 5 MG TABS tablet Take 5 mg by mouth daily.     JANUVIA 50 MG tablet Take 50 mg by mouth daily.     metoprolol tartrate (LOPRESSOR) 25 MG tablet Take 1 tablet (25 mg total) by mouth 2 (two) times daily. 60 tablet 1   nitroGLYCERIN (NITROSTAT) 0.4 MG SL tablet Place 0.4 mg under the tongue every 5 (five) minutes as needed for chest pain.     omeprazole (PRILOSEC) 20 MG capsule Take 20 mg by mouth 2 (two) times daily.      oxyCODONE (ROXICODONE) 5 MG  immediate release tablet Take 1 tablet (5 mg total) by mouth every 6 (six) hours as needed for severe pain. 20 tablet 0   pravastatin (PRAVACHOL) 20 MG tablet Take 20 mg by mouth every evening.     sodium bicarbonate 650 MG tablet Take 1 tablet (650 mg total) by mouth 2 (two) times daily. 60 tablet 0   No facility-administered medications prior to visit.    Review of Systems  Constitutional:  Negative for chills, diaphoresis, fever, malaise/fatigue and weight loss.  HENT:  Negative for congestion.   Respiratory:  Positive for cough, sputum production and shortness of breath. Negative for hemoptysis and wheezing.   Cardiovascular:  Negative for chest pain, palpitations and leg swelling.    Objective:   Vitals:   12/26/20 1646  BP: 122/82  Pulse: (!) 58  Temp: 98.3 F (36.8 C)  TempSrc: Oral  SpO2: 93%  Weight: 163 lb 3.2 oz (74 kg)  Height: 5\' 1"  (1.549 m)      Physical Exam: General: Well-appearing, no acute distress HENT: [place], AT Eyes: EOMI, no scleral icterus Respiratory: Clear to auscultation bilaterally.  No crackles, wheezing or rales Cardiovascular: RRR, -M/R/G, no JVD Extremities:-Edema,-tenderness Neuro: AAO x4, CNII-XII grossly intact Psych: Normal mood, normal affect  Data Reviewed:  Imaging: CXR 02/06/19 - Chronic interstitial changes. Bilateral ground glass opacities  PFT: 05/02/12 DLCO 45% Interpretation: Normal spirometry and lung volumes. Moderately reduced DLCO    Assessment & Plan:   Discussion:  COVID-19 Long Hauler manifesting as shortness of breath Acute bronchitis --START Prednisone 40 mg x 5 days in the morning --CONTINUE Symbicort 160-4.5 mcg TWO puffs TWICE a day --CONTINUE Albuterol AS NEEDED for shortness of breath --START exercising daily with small walks around the house  Chronic hypoxemic respiratory failure --Wear oxygen for goal >90%  When you are ready we can refer to Pulmonary Rehab to help with your strength  Health  Maintenance Immunization History  Administered Date(s) Administered   Pneumococcal Polysaccharide-23 09/20/2009   Td 09/20/2009   CT Lung Screen - not indicated  No orders of the defined types were placed in this encounter. Meds ordered this encounter  Medications   predniSONE (DELTASONE) 20 MG tablet    Sig: Take 2 tablets (40 mg total) by mouth daily with breakfast for 5 days.    Dispense:  10 tablet    Refill:  0    Return in about  3 months (around 03/27/2021).  I have spent a total time of 31-minutes on the day of the appointment reviewing prior documentation, coordinating care and discussing medical diagnosis and plan with the patient/family. Past medical history, allergies, medications were reviewed. Pertinent imaging, labs and tests included in this note have been reviewed and interpreted independently by me.   Tesneem Dufrane Mechele Collin, MD [facility] 12/26/2020 12:08 PM  Office Number [phone]

## 2021-02-10 ENCOUNTER — Other Ambulatory Visit: Payer: Self-pay

## 2021-02-10 ENCOUNTER — Emergency Department (HOSPITAL_COMMUNITY): Payer: Medicare Other

## 2021-02-10 ENCOUNTER — Inpatient Hospital Stay (HOSPITAL_COMMUNITY)
Admission: EM | Admit: 2021-02-10 | Discharge: 2021-02-14 | DRG: 432 | Disposition: A | Discharge status: Home / Self Care | Payer: Medicare Other | Attending: Internal Medicine | Admitting: Internal Medicine

## 2021-02-10 ENCOUNTER — Encounter (HOSPITAL_COMMUNITY): Payer: Self-pay | Admitting: Emergency Medicine

## 2021-02-10 DIAGNOSIS — Z87891 Personal history of nicotine dependence: Secondary | ICD-10-CM

## 2021-02-10 DIAGNOSIS — R918 Other nonspecific abnormal finding of lung field: Secondary | ICD-10-CM | POA: Diagnosis present

## 2021-02-10 DIAGNOSIS — Z6831 Body mass index (BMI) 31.0-31.9, adult: Secondary | ICD-10-CM

## 2021-02-10 DIAGNOSIS — D631 Anemia in chronic kidney disease: Secondary | ICD-10-CM | POA: Diagnosis present

## 2021-02-10 DIAGNOSIS — M109 Gout, unspecified: Secondary | ICD-10-CM | POA: Diagnosis present

## 2021-02-10 DIAGNOSIS — R188 Other ascites: Secondary | ICD-10-CM | POA: Diagnosis present

## 2021-02-10 DIAGNOSIS — E669 Obesity, unspecified: Secondary | ICD-10-CM | POA: Diagnosis present

## 2021-02-10 DIAGNOSIS — N1832 Chronic kidney disease, stage 3b: Secondary | ICD-10-CM | POA: Diagnosis present

## 2021-02-10 DIAGNOSIS — J9611 Chronic respiratory failure with hypoxia: Secondary | ICD-10-CM | POA: Diagnosis present

## 2021-02-10 DIAGNOSIS — E785 Hyperlipidemia, unspecified: Secondary | ICD-10-CM | POA: Diagnosis present

## 2021-02-10 DIAGNOSIS — R0602 Shortness of breath: Secondary | ICD-10-CM | POA: Diagnosis not present

## 2021-02-10 DIAGNOSIS — Z20822 Contact with and (suspected) exposure to covid-19: Secondary | ICD-10-CM | POA: Diagnosis present

## 2021-02-10 DIAGNOSIS — Z8249 Family history of ischemic heart disease and other diseases of the circulatory system: Secondary | ICD-10-CM

## 2021-02-10 DIAGNOSIS — R7309 Other abnormal glucose: Secondary | ICD-10-CM

## 2021-02-10 DIAGNOSIS — Z953 Presence of xenogenic heart valve: Secondary | ICD-10-CM

## 2021-02-10 DIAGNOSIS — I130 Hypertensive heart and chronic kidney disease with heart failure and stage 1 through stage 4 chronic kidney disease, or unspecified chronic kidney disease: Secondary | ICD-10-CM | POA: Diagnosis present

## 2021-02-10 DIAGNOSIS — K7469 Other cirrhosis of liver: Principal | ICD-10-CM

## 2021-02-10 DIAGNOSIS — Z7984 Long term (current) use of oral hypoglycemic drugs: Secondary | ICD-10-CM

## 2021-02-10 DIAGNOSIS — K219 Gastro-esophageal reflux disease without esophagitis: Secondary | ICD-10-CM | POA: Diagnosis present

## 2021-02-10 DIAGNOSIS — Z79899 Other long term (current) drug therapy: Secondary | ICD-10-CM

## 2021-02-10 DIAGNOSIS — D696 Thrombocytopenia, unspecified: Secondary | ICD-10-CM | POA: Diagnosis present

## 2021-02-10 DIAGNOSIS — J449 Chronic obstructive pulmonary disease, unspecified: Secondary | ICD-10-CM | POA: Diagnosis present

## 2021-02-10 DIAGNOSIS — I5033 Acute on chronic diastolic (congestive) heart failure: Secondary | ICD-10-CM | POA: Diagnosis present

## 2021-02-10 DIAGNOSIS — E1122 Type 2 diabetes mellitus with diabetic chronic kidney disease: Secondary | ICD-10-CM | POA: Diagnosis present

## 2021-02-10 DIAGNOSIS — U099 Post covid-19 condition, unspecified: Secondary | ICD-10-CM | POA: Diagnosis present

## 2021-02-10 DIAGNOSIS — K7460 Unspecified cirrhosis of liver: Secondary | ICD-10-CM | POA: Diagnosis present

## 2021-02-10 DIAGNOSIS — I480 Paroxysmal atrial fibrillation: Secondary | ICD-10-CM | POA: Diagnosis present

## 2021-02-10 DIAGNOSIS — I509 Heart failure, unspecified: Secondary | ICD-10-CM

## 2021-02-10 DIAGNOSIS — Z951 Presence of aortocoronary bypass graft: Secondary | ICD-10-CM

## 2021-02-10 DIAGNOSIS — Z9981 Dependence on supplemental oxygen: Secondary | ICD-10-CM

## 2021-02-10 DIAGNOSIS — Z7982 Long term (current) use of aspirin: Secondary | ICD-10-CM

## 2021-02-10 DIAGNOSIS — R161 Splenomegaly, not elsewhere classified: Secondary | ICD-10-CM | POA: Diagnosis present

## 2021-02-10 LAB — CBC WITH DIFFERENTIAL/PLATELET
Abs Immature Granulocytes: 0.02 10*3/uL (ref 0.00–0.07)
Basophils Absolute: 0 10*3/uL (ref 0.0–0.1)
Basophils Relative: 1 %
Eosinophils Absolute: 0.1 10*3/uL (ref 0.0–0.5)
Eosinophils Relative: 4 %
HCT: 35.3 % — ABNORMAL LOW (ref 39.0–52.0)
Hemoglobin: 11.3 g/dL — ABNORMAL LOW (ref 13.0–17.0)
Immature Granulocytes: 1 %
Lymphocytes Relative: 33 %
Lymphs Abs: 1.1 10*3/uL (ref 0.7–4.0)
MCH: 29.1 pg (ref 26.0–34.0)
MCHC: 32 g/dL (ref 30.0–36.0)
MCV: 91 fL (ref 80.0–100.0)
Monocytes Absolute: 0.2 10*3/uL (ref 0.1–1.0)
Monocytes Relative: 7 %
Neutro Abs: 1.7 10*3/uL (ref 1.7–7.7)
Neutrophils Relative %: 54 %
Platelets: 62 10*3/uL — ABNORMAL LOW (ref 150–400)
RBC: 3.88 MIL/uL — ABNORMAL LOW (ref 4.22–5.81)
RDW: 15.2 % (ref 11.5–15.5)
WBC: 3.2 10*3/uL — ABNORMAL LOW (ref 4.0–10.5)
nRBC: 0 % (ref 0.0–0.2)

## 2021-02-10 LAB — COMPREHENSIVE METABOLIC PANEL
ALT: 25 U/L (ref 0–44)
AST: 42 U/L — ABNORMAL HIGH (ref 15–41)
Albumin: 2.4 g/dL — ABNORMAL LOW (ref 3.5–5.0)
Alkaline Phosphatase: 182 U/L — ABNORMAL HIGH (ref 38–126)
Anion gap: 4 — ABNORMAL LOW (ref 5–15)
BUN: 11 mg/dL (ref 8–23)
CO2: 21 mmol/L — ABNORMAL LOW (ref 22–32)
Calcium: 7.7 mg/dL — ABNORMAL LOW (ref 8.9–10.3)
Chloride: 115 mmol/L — ABNORMAL HIGH (ref 98–111)
Creatinine, Ser: 2.21 mg/dL — ABNORMAL HIGH (ref 0.61–1.24)
GFR, Estimated: 30 mL/min — ABNORMAL LOW (ref >60)
Glucose, Bld: 133 mg/dL — ABNORMAL HIGH (ref 70–99)
Potassium: 4.3 mmol/L (ref 3.5–5.1)
Sodium: 140 mmol/L (ref 135–145)
Total Bilirubin: 0.9 mg/dL (ref 0.3–1.2)
Total Protein: 5.2 g/dL — ABNORMAL LOW (ref 6.5–8.1)

## 2021-02-10 LAB — TROPONIN I (HIGH SENSITIVITY): Troponin I (High Sensitivity): 8 ng/L (ref <18)

## 2021-02-10 LAB — BRAIN NATRIURETIC PEPTIDE: B Natriuretic Peptide: 221.3 pg/mL — ABNORMAL HIGH (ref 0.0–100.0)

## 2021-02-10 NOTE — ED Provider Notes (Signed)
Emergency Medicine Provider Triage Evaluation Note  Ryan Burke , a 80 y.o. male  was evaluated in triage.  Pt complains of bilateral leg swelling, abdominal swelling, and shortness of breath for approximately 2 weeks.  History of aortic valve replacement, no documented history of congestive heart failure.  Patient is denying any pain.  Chronically on 2 L of oxygen after being diagnosed with COVID earlier this year.  Not had any increased oxygen requirement.  Review of Systems  Positive: Leg swelling, shortness of breath, abdominal swelling Negative: Chest pain, abdominal pain, nausea, vomiting  Physical Exam  BP (!) 142/84   Pulse 60   Temp 97.6 F (36.4 C) (Oral)   Resp (!) 26   SpO2 100%  Gen:   Awake, no distress   Resp:  Normal effort  MSK:   Moves extremities without difficulty  Other:  Abdomen distended, bilateral pitting edema  Medical Decision Making  Medically screening exam initiated at 5:11 PM.  Appropriate orders placed.  Ryan Burke was informed that the remainder of the evaluation will be completed by another provider, this initial triage assessment does not replace that evaluation, and the importance of remaining in the ED until their evaluation is complete.     Jeanella Flattery 02/10/21 1712    Terrilee Files, MD 02/10/21 [phone]

## 2021-02-10 NOTE — ED Triage Notes (Signed)
Patient here with swelling to abdomen and lower legs for greater than 1 week. Denies pain and uses oxygen at home since covid.

## 2021-02-11 ENCOUNTER — Observation Stay (HOSPITAL_COMMUNITY): Payer: Medicare Other

## 2021-02-11 ENCOUNTER — Encounter (HOSPITAL_COMMUNITY): Payer: Self-pay | Admitting: Internal Medicine

## 2021-02-11 ENCOUNTER — Emergency Department (HOSPITAL_COMMUNITY): Payer: Medicare Other

## 2021-02-11 DIAGNOSIS — I5031 Acute diastolic (congestive) heart failure: Secondary | ICD-10-CM

## 2021-02-11 DIAGNOSIS — I509 Heart failure, unspecified: Secondary | ICD-10-CM

## 2021-02-11 LAB — TROPONIN I (HIGH SENSITIVITY): Troponin I (High Sensitivity): 9 ng/L (ref <18)

## 2021-02-11 LAB — ECHOCARDIOGRAM COMPLETE
AR max vel: 1.54 cm2
AV Area VTI: 1.95 cm2
AV Area mean vel: 1.43 cm2
AV Mean grad: 11 mmHg
AV Peak grad: 19.4 mmHg
Ao pk vel: 2.2 m/s
Area-P 1/2: 2.62 cm2
S' Lateral: 3.4 cm

## 2021-02-11 LAB — GLUCOSE, CAPILLARY
Glucose-Capillary: 100 mg/dL — ABNORMAL HIGH (ref 70–99)
Glucose-Capillary: 127 mg/dL — ABNORMAL HIGH (ref 70–99)

## 2021-02-11 LAB — RESP PANEL BY RT-PCR (FLU A&B, COVID) ARPGX2
Influenza A by PCR: NEGATIVE
Influenza B by PCR: NEGATIVE
SARS Coronavirus 2 by RT PCR: NEGATIVE

## 2021-02-11 LAB — CBG MONITORING, ED: Glucose-Capillary: 92 mg/dL (ref 70–99)

## 2021-02-11 MED ORDER — MOMETASONE FURO-FORMOTEROL FUM 200-5 MCG/ACT IN AERO
2.0000 | INHALATION_SPRAY | Freq: Two times a day (BID) | RESPIRATORY_TRACT | Status: DC
  Administered 2021-02-11 – 2021-02-14 (×5): 2 via RESPIRATORY_TRACT
  Filled 2021-02-11 (×2): qty 8.8

## 2021-02-11 MED ORDER — DOXAZOSIN MESYLATE 8 MG PO TABS
4.0000 mg | ORAL_TABLET | Freq: Every day | ORAL | Status: DC
  Administered 2021-02-11: 4 mg via ORAL
  Filled 2021-02-11: qty 1

## 2021-02-11 MED ORDER — FUROSEMIDE 10 MG/ML IJ SOLN
20.0000 mg | Freq: Once | INTRAMUSCULAR | Status: AC
  Administered 2021-02-11: 20 mg via INTRAVENOUS
  Filled 2021-02-11: qty 2

## 2021-02-11 MED ORDER — METOPROLOL TARTRATE 25 MG PO TABS
25.0000 mg | ORAL_TABLET | Freq: Two times a day (BID) | ORAL | Status: DC
  Administered 2021-02-11 – 2021-02-14 (×6): 25 mg via ORAL
  Filled 2021-02-11 (×7): qty 1

## 2021-02-11 MED ORDER — ACETAMINOPHEN 325 MG PO TABS
650.0000 mg | ORAL_TABLET | Freq: Four times a day (QID) | ORAL | Status: DC | PRN
  Administered 2021-02-13: 09:00:00 650 mg via ORAL
  Filled 2021-02-11: qty 2

## 2021-02-11 MED ORDER — ASPIRIN EC 81 MG PO TBEC
81.0000 mg | DELAYED_RELEASE_TABLET | Freq: Every day | ORAL | Status: DC
  Administered 2021-02-11 – 2021-02-14 (×4): 81 mg via ORAL
  Filled 2021-02-11 (×4): qty 1

## 2021-02-11 MED ORDER — SODIUM CHLORIDE 0.9% FLUSH
3.0000 mL | Freq: Two times a day (BID) | INTRAVENOUS | Status: DC
  Administered 2021-02-11 – 2021-02-14 (×7): 3 mL via INTRAVENOUS

## 2021-02-11 MED ORDER — LORATADINE 10 MG PO TABS
10.0000 mg | ORAL_TABLET | Freq: Every day | ORAL | Status: DC
  Administered 2021-02-11 – 2021-02-14 (×4): 10 mg via ORAL
  Filled 2021-02-11 (×4): qty 1

## 2021-02-11 MED ORDER — SODIUM BICARBONATE 650 MG PO TABS: 650.0000 mg | ORAL_TABLET | Freq: Two times a day (BID) | ORAL | Status: DC

## 2021-02-11 MED ORDER — PANTOPRAZOLE SODIUM 40 MG PO TBEC
40.0000 mg | DELAYED_RELEASE_TABLET | Freq: Every day | ORAL | Status: DC
  Administered 2021-02-11 – 2021-02-14 (×4): 40 mg via ORAL
  Filled 2021-02-11 (×4): qty 1

## 2021-02-11 MED ORDER — SODIUM CHLORIDE 0.9 % IV SOLN: 250.0000 mL | INTRAVENOUS | Status: DC | PRN

## 2021-02-11 MED ORDER — COLCHICINE 0.6 MG PO TABS
0.6000 mg | ORAL_TABLET | Freq: Two times a day (BID) | ORAL | Status: DC | PRN
  Filled 2021-02-11: qty 1

## 2021-02-11 MED ORDER — NITROGLYCERIN 0.4 MG SL SUBL: 0.4000 mg | SUBLINGUAL_TABLET | SUBLINGUAL | Status: DC | PRN

## 2021-02-11 MED ORDER — ONDANSETRON HCL 4 MG/2ML IJ SOLN: 4.0000 mg | Freq: Four times a day (QID) | INTRAMUSCULAR | Status: DC | PRN

## 2021-02-11 MED ORDER — INSULIN ASPART 100 UNIT/ML IJ SOLN: 0.0000 [IU] | Freq: Every day | INTRAMUSCULAR | Status: DC

## 2021-02-11 MED ORDER — SODIUM CHLORIDE 0.9% FLUSH: 3.0000 mL | INTRAVENOUS | Status: DC | PRN

## 2021-02-11 MED ORDER — ONDANSETRON HCL 4 MG PO TABS: 4.0000 mg | ORAL_TABLET | Freq: Four times a day (QID) | ORAL | Status: DC | PRN

## 2021-02-11 MED ORDER — ALBUTEROL SULFATE HFA 108 (90 BASE) MCG/ACT IN AERS: 2.0000 | INHALATION_SPRAY | Freq: Four times a day (QID) | RESPIRATORY_TRACT | Status: DC | PRN

## 2021-02-11 MED ORDER — IPRATROPIUM-ALBUTEROL 0.5-2.5 (3) MG/3ML IN SOLN
3.0000 mL | Freq: Once | RESPIRATORY_TRACT | Status: AC
  Administered 2021-02-11: 3 mL via RESPIRATORY_TRACT
  Filled 2021-02-11: qty 3

## 2021-02-11 MED ORDER — PRAVASTATIN SODIUM 10 MG PO TABS
20.0000 mg | ORAL_TABLET | Freq: Every evening | ORAL | Status: DC
  Administered 2021-02-11 – 2021-02-13 (×3): 20 mg via ORAL
  Filled 2021-02-11 (×3): qty 2

## 2021-02-11 MED ORDER — FUROSEMIDE 10 MG/ML IJ SOLN
20.0000 mg | Freq: Two times a day (BID) | INTRAMUSCULAR | Status: DC
Start: 2021-02-11 — End: 2021-02-12
  Administered 2021-02-12: 20 mg via INTRAVENOUS
  Filled 2021-02-11 (×2): qty 2

## 2021-02-11 MED ORDER — INSULIN ASPART 100 UNIT/ML IJ SOLN
0.0000 [IU] | Freq: Three times a day (TID) | INTRAMUSCULAR | Status: DC
  Administered 2021-02-13 (×2): 1 [IU] via SUBCUTANEOUS

## 2021-02-11 MED ORDER — ALBUTEROL SULFATE (2.5 MG/3ML) 0.083% IN NEBU
2.5000 mg | INHALATION_SOLUTION | Freq: Four times a day (QID) | RESPIRATORY_TRACT | Status: DC | PRN
  Administered 2021-02-11: 2.5 mg via RESPIRATORY_TRACT
  Filled 2021-02-11: qty 3

## 2021-02-11 MED ORDER — ACETAMINOPHEN 650 MG RE SUPP: 650.0000 mg | Freq: Four times a day (QID) | RECTAL | Status: DC | PRN

## 2021-02-11 NOTE — ED Notes (Signed)
Echo will send pt to 3E22 after echo.

## 2021-02-11 NOTE — H&P (Addendum)
History and Physical    JOVAUGHN KERINS [id number] DOB: 09-21-1940 DOA: 02/10/2021  PCP: Nolene Ebbs, MD   Patient coming from: Home  Chief Complaint: Worsening lower extremity and abdominal edema  HPI: Ryan Burke is a 80 y.o. male with medical history significant for COPD with chronic hypoxemia, long-haul COVID, prior aortic valve replacement/CABG, type 2 diabetes, dyslipidemia, hypertension, gout, who lives with his son and daughter-in-law at home.  He is non-English speaking/Nepalese and history was obtained through the assistance of his daughter-in-law at bedside (patient's preference).  He has apparently been having progressively worsening edema which initially began in his lower extremities that then progressed into his abdomen.  This all occurred over an approximately 1 week time span and was associated with some slightly worsening shortness of breath.  He is noted to have chronic shortness of breath with use of 2-3 L nasal cannula at home for the last 2 years after he was initially treated for COVID.  He denies any cough or chest congestion and no fevers or chills are reported.  No nausea or vomiting.  He apparently has been eating and drinking a little bit less than his usual over the last couple days.   ED Course: Vital signs stable and patient is afebrile.  He appears to currently be in sinus rhythm on telemetry.  He is currently on 3 L nasal cannula.  His creatinine is near his baseline of 2.2 and his BNP is at 221.3.  He is noted to have chronic thrombocytopenia with platelet count of 62,000.  He has been given 1 dose of IV Lasix 20 mg with unknown urine output as this was not measured.  CT scan of his chest and abdomen demonstrates some moderate ascites as well as findings of cirrhosis.  He is also noted to have a right upper lobe posterior lung mass which will need follow-up in the outpatient setting.  Review of Systems: Reviewed as noted above, otherwise negative.  Past  Medical History:  Diagnosis Date   Aortic regurgitation    Asthma    Chest pain at rest    Chronic bronchitis ([place])    Chronic kidney disease    Diabetes mellitus without complication ([place])    "has had it before; he's ok now; not on RC anymore" (08/27/2016)   GERD (gastroesophageal reflux disease)    Gout    Heart murmur    Hyperlipidemia    Hypertension    Mitral regurgitation    Seizures ([place]) 03/2013 X 1   "maybe because of a high fever"   Shortness of breath    "w/activity" (08/27/2016)    Past Surgical History:  Procedure Laterality Date   ABDOMINAL AORTAGRAM N/A 04/12/2012   Procedure: ABDOMINAL Maxcine Ham;  Surgeon: Laverda Page, MD;  Location[facility] C[facility] LAB;  Service: Cardiovascular;  Laterality: N/A;   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   Procedure: AORTIC VALVE REPLACEMENT (AVR);  Surgeon: Gaye Pollack, MD;  Location: [place];  Service: Open Heart Surgery;  Laterality: N/A;   CARDIAC CATHETERIZATION     CARDIAC VALVE REPLACEMENT     CATARACT EXTRACTION W/ INTRAOCULAR LENS  IMPLANT, BILATERAL Bilateral    INTRAOPERATIVE TRANSESOPHAGEAL ECHOCARDIOGRAM  05/04/2012   Procedure: INTRAOPERATIVE TRANSESOPHAGEAL ECHOCARDIOGRAM;  Surgeon: Gaye Pollack, MD;  Location: [facility] OR;  Service: Open Heart Surgery;  Laterality: N/A;   LEFT AND RIGHT HEART CATHETERIZATION WITH CORONARY ANGIOGRAM N/A 04/12/2012   Procedure: LEFT AND RIGHT HEART CATHETERIZATION WITH CORONARY ANGIOGRAM;  Surgeon:  Laverda Page, MD;  Location: [facility] CATH LAB;  Service: Cardiovascular;  Laterality: N/A;     reports that he quit smoking about 19 years ago. His smoking use included cigarettes. He started smoking about 61 years ago. He has a 31.50 pack-year smoking history. He has quit using smokeless tobacco.  His smokeless tobacco use included chew. He reports current alcohol use of about 1.0 standard drink per week. He reports that he does not use drugs.  No Known Allergies  Family History  Problem Relation Age  of Onset   Heart disease Sister     Prior to Admission medications   Medication Sig Start Date End Date Taking? Authorizing Provider  acetaminophen (TYLENOL) 500 MG tablet Take 500 mg by mouth every 6 (six) hours as needed for moderate pain or headache.   Yes [provider]  albuterol (VENTOLIN HFA) 108 (90 Base) MCG/ACT inhaler Inhale 2 puffs into the lungs every 6 (six) hours as needed for wheezing or shortness of breath. 10/14/20  Yes Margaretha Seeds, MD  aspirin EC 81 MG tablet Take 81 mg by mouth daily.   Yes [provider]  budesonide-formoterol (SYMBICORT) 160-4.5 MCG/ACT inhaler Inhale 2 puffs into the lungs in the morning and at bedtime. 10/14/20  Yes Margaretha Seeds, MD  cetirizine (ZYRTEC) 10 MG tablet Take 10 mg by mouth daily as needed for allergies. 12/29/18  Yes [provider]  colchicine 0.6 MG tablet Take 0.6 mg by mouth 2 (two) times daily as needed (gout).   Yes [provider]  doxazosin (CARDURA) 4 MG tablet Take 4 mg by mouth daily.   Yes [provider]  FARXIGA 5 MG TABS tablet Take 5 mg by mouth daily. 12/22/18  Yes [provider]  JANUVIA 50 MG tablet Take 50 mg by mouth daily. 02/01/19  Yes [provider]  metoprolol tartrate (LOPRESSOR) 25 MG tablet Take 1 tablet (25 mg total) by mouth 2 (two) times daily. 05/08/12  Yes Barrett, Erin R, PA-C  nitroGLYCERIN (NITROSTAT) 0.4 MG SL tablet Place 0.4 mg under the tongue every 5 (five) minutes as needed for chest pain.   Yes [provider]  omeprazole (PRILOSEC) 20 MG capsule Take 20 mg by mouth 2 (two) times daily.    Yes [provider]  pravastatin (PRAVACHOL) 20 MG tablet Take 20 mg by mouth every evening.   Yes [provider]  cefdinir (OMNICEF) 300 MG capsule Take 1 capsule (300 mg total) by mouth daily. Patient not taking: No sig reported 10/24/20   Ripley Fraise, MD  doxycycline (VIBRAMYCIN) 100 MG capsule Take 1 capsule  (100 mg total) by mouth 2 (two) times daily. One po bid x 7 days Patient not taking: No sig reported 10/24/20   Ripley Fraise, MD  oxyCODONE (ROXICODONE) 5 MG immediate release tablet Take 1 tablet (5 mg total) by mouth every 6 (six) hours as needed for severe pain. Patient not taking: No sig reported 04/17/13   Blanchie Dessert, MD  sodium bicarbonate 650 MG tablet Take 1 tablet (650 mg total) by mouth 2 (two) times daily. Patient not taking: No sig reported 02/14/19   Jonetta Osgood, MD    Physical Exam: Vitals:   02/11/21 0530 02/11/21 0700 02/11/21 0745 02/11/21 0830  BP: (!) 167/86 (!) 165/85 (!) 157/89 (!) 144/88  Pulse: (!) 51 (!) 55 (!) 56 (!) 54  Resp: 13 15 14 18   Temp:   98.1 F (36.7 C)  TempSrc:   Oral   SpO2: 100% 100% 93% 100%    Constitutional: NAD, calm, comfortable Vitals:   02/11/21 0530 02/11/21 0700 02/11/21 0745 02/11/21 0830  BP: (!) 167/86 (!) 165/85 (!) 157/89 (!) 144/88  Pulse: (!) 51 (!) 55 (!) 56 (!) 54  Resp: 13 15 14 18   Temp:   98.1 F (36.7 C)   TempSrc:   Oral   SpO2: 100% 100% 93% 100%   Eyes: lids and conjunctivae normal Neck: normal, supple Respiratory: clear to auscultation bilaterally. Normal respiratory effort. No accessory muscle use.  Currently on 3 L nasal cannula Cardiovascular: Regular rate and rhythm, no murmurs. Abdomen: no tenderness, moderate distention. Bowel sounds positive.  Musculoskeletal: 2+ pitting edema to bilateral knees Skin: no rashes, lesions, ulcers.  Psychiatric: Flat affect  Labs on Admission: I have personally reviewed following labs and imaging studies  CBC: Recent Labs  Lab 02/10/21 1715  WBC 3.2*  NEUTROABS 1.7  HGB 11.3*  HCT 35.3*  MCV 91.0  PLT 62*   Basic Metabolic Panel: Recent Labs  Lab 02/10/21 1715  NA 140  K 4.3  CL 115*  CO2 21*  GLUCOSE 133*  BUN 11  CREATININE 2.21*  CALCIUM 7.7*   GFR: CrCl cannot be calculated (Unknown ideal weight.). Liver Function  Tests: Recent Labs  Lab 02/10/21 1715  AST 42*  ALT 25  ALKPHOS 182*  BILITOT 0.9  PROT 5.2*  ALBUMIN 2.4*   No results for input(s): LIPASE, AMYLASE in the last 168 hours. No results for input(s): AMMONIA in the last 168 hours. Coagulation Profile: No results for input(s): INR, PROTIME in the last 168 hours. Cardiac Enzymes: No results for input(s): CKTOTAL, CKMB, CKMBINDEX, TROPONINI in the last 168 hours. BNP (last 3 results) No results for input(s): PROBNP in the last 8760 hours. HbA1C: No results for input(s): HGBA1C in the last 72 hours. CBG: No results for input(s): GLUCAP in the last 168 hours. Lipid Profile: No results for input(s): CHOL, HDL, LDLCALC, TRIG, CHOLHDL, LDLDIRECT in the last 72 hours. Thyroid Function Tests: No results for input(s): TSH, T4TOTAL, FREET4, T3FREE, THYROIDAB in the last 72 hours. Anemia Panel: No results for input(s): VITAMINB12, FOLATE, FERRITIN, TIBC, IRON, RETICCTPCT in the last 72 hours. Urine analysis:    Component Value Date/Time   COLORURINE AMBER (A) 08/27/2016 1654   APPEARANCEUR HAZY (A) 08/27/2016 1654   LABSPEC 1.025 08/27/2016 1654   PHURINE 5.0 08/27/2016 1654   GLUCOSEU NEGATIVE 08/27/2016 1654   GLUCOSEU NEG mg/dL 08/29/2016 29/52/8413   HGBUR NEGATIVE 08/27/2016 1654   HGBUR negative 09/20/2009 1029   BILIRUBINUR NEGATIVE 08/27/2016 1654   KETONESUR NEGATIVE 08/27/2016 1654   PROTEINUR 30 (A) 08/27/2016 1654   UROBILINOGEN 0.2 04/19/2013 0355   NITRITE NEGATIVE 08/27/2016 1654   LEUKOCYTESUR NEGATIVE 08/27/2016 1654    Radiological Exams on Admission: CT Abdomen Pelvis Wo Contrast  Result Date: 02/11/2021 CLINICAL DATA:  Chest pain, shortness of breath and abdominal swelling. EXAM: CT CHEST, ABDOMEN AND PELVIS WITHOUT CONTRAST TECHNIQUE: Multidetector CT imaging of the chest, abdomen and pelvis was performed following the standard protocol without IV contrast. COMPARISON:  A CTA chest study 11/07/2009 FINDINGS: CT  CHEST FINDINGS Cardiovascular: The heart is moderately enlarged. No pericardial effusion is seen. There are sternotomy sutures, prosthetic aortic valve which has been seen on more recent chest films, apparent sleeve repair of the ascending aorta with thoracic aortic ectasia with the ascending segment 4.1 cm, the arch 3.4 cm and the descending  segment up to 3.7 cm. There are scattered aortic calcific plaques. The pulmonary trunk is upper-normal caliber. Mediastinum/Nodes: Enlarged lower right paratracheal lymph node today measures 1.5 x 1.5 cm, previously 1.5 x 1.3 cm. There is a slightly prominent lymph node just above this which is unchanged. No other adenopathy seen without contrast. Lungs/Pleura: There is extensive coarse chronic interstitial change, with cystic honeycombing predominating in the upper lobes but involving all pulmonary segments. This has progressed since 2011. Some of the subpleural interstitial thickening in the lower zones could be due to interstitial edema. Is difficult to assess for subtle infiltrates in the study due to respiratory motion. There is a focal posterior right upper lobe pleural based opacity measuring 1.6 x 1.5 cm which could be a pulmonary mass, focal scarring or infiltrate and was not present in 2011. There are small right greater than left layering pleural effusions. Musculoskeletal: There are degenerative changes of the thoracic spine. CT ABDOMEN PELVIS FINDINGS: Images are grainy due to patient body habitus, ascites and technique. Hepatobiliary: Liver is 14.3 cm length. Some images may suggest nodular surface changes of cirrhosis, not seen in 2011. No focal lesion is seen without contrast. There is a 2.4 cm stone in the gallbladder. No biliary dilatation or gallbladder thickening is seen. Pancreas: No visible focal abnormality without contrast. Spleen: Enlarged, measuring 15.9 cm in length, otherwise unremarkable without contrast. Adrenals/Urinary Tract: There is no adrenal  mass, no focal abnormality of unenhanced renal cortex but there is bilateral cortical thinning noted. There are no stones or hydronephrosis. Stomach/Bowel: There is a moderately thickened stomach, diffusely. Small hiatal hernia with thickened distal thoracic esophagus. No small bowel obstruction or inflammation is seen. There are mild mesenteric congestive features and mild body wall anasarca in the lower abdomen. There are scattered colonic diverticula without evidence of diverticulitis. Vascular/Lymphatic: Aortic atherosclerosis. No enlarged abdominal or pelvic lymph nodes. The hepatic portal vein is prominent measuring 15.2 mm AP. There is at least partial recanalization of the umbilical vein. There is an engorged splenic vein. Reproductive: Mildly enlarged prostate gland. Right testicle appears to be retracted into the inguinal canal. Other: There is moderate free ascites in the abdomen and pelvis. Musculoskeletal: There is osteopenia with degenerative changes in the lumbar spine and multilevel endplate Schmorl's nodes. IMPRESSION: 1. Cardiomegaly, with aortic ectasia and aortic valve replacement. 2. Pulmonary fibrosis with honeycombing, upper lobe predominance. Breathing motion limits assessment for subtle infiltrates. Some of the subpleural interstitial thickening in the bases could be due to interstitial edema is there are small right greater than left pleural effusions. 3. 1.6 x 1.5 cm pleural based opacity in the posterior right upper lobe which could be an inflammatory nodule, nodular atelectasis or scarring or neoplasm. PET-CT or three-month follow-up CT recommended. 4. New significant change in mildly prominent mediastinal lymph nodes. 5. Enlarged spleen, prominent portal vein and some images suggesting cirrhosis of the liver, with moderate free ascites. 6. Prominent distal esophageal and gastric wall, which could be due to portal hypertension or esophagogastritis. 7. Prostatomegaly with probable  retraction of the right testicle into the right inguinal canal. 8. Mesenteric congestive features and mild lower abdominal body wall anasarca. 9. Cholelithiasis and remaining findings discussed above Electronically Signed   By: Telford Nab M.D.   On: 02/11/2021 05:37   DG Chest 2 View  Result Date: 02/10/2021 CLINICAL DATA:  Acute shortness of breath for 3 days. EXAM: CHEST - 2 VIEW COMPARISON:  10/23/2020 FINDINGS: Cardiomegaly and cardiac valve replacement changes noted.  Diffuse bilateral interstitial opacities are unchanged. No pleural effusion or pneumothorax identified. No acute bony abnormalities are noted. IMPRESSION: Cardiomegaly with chronic diffuse bilateral interstitial opacities. Electronically Signed   By: Margarette Canada M.D.   On: 02/10/2021 17:42   CT Chest Wo Contrast  Result Date: 02/11/2021 CLINICAL DATA:  Chest pain, shortness of breath and abdominal swelling. EXAM: CT CHEST, ABDOMEN AND PELVIS WITHOUT CONTRAST TECHNIQUE: Multidetector CT imaging of the chest, abdomen and pelvis was performed following the standard protocol without IV contrast. COMPARISON:  A CTA chest study 11/07/2009 FINDINGS: CT CHEST FINDINGS Cardiovascular: The heart is moderately enlarged. No pericardial effusion is seen. 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 There are scattered aortic calcific plaques. The pulmonary trunk is upper-normal caliber. Mediastinum/Nodes: Enlarged lower right paratracheal lymph node today measures 1.5 x 1.5 cm, previously 1.5 x 1.3 cm. There is a slightly prominent lymph node just above this which is unchanged. No other adenopathy seen without contrast. Lungs/Pleura: There is extensive coarse chronic interstitial change, with cystic honeycombing predominating  in the upper lobes but involving all pulmonary segments. This has progressed since 2011. Some of the subpleural interstitial thickening in the lower zones could be due to interstitial edema. Is difficult to assess for subtle infiltrates in the study due to respiratory motion. There is a focal posterior right upper lobe pleural based opacity measuring 1.6 x 1.5 cm which could be a pulmonary mass, focal scarring or infiltrate and was not present in 2011. There are small right greater than left layering pleural effusions. Musculoskeletal: There are degenerative changes of the thoracic spine. CT ABDOMEN PELVIS FINDINGS: Images are grainy due to patient body habitus, ascites and technique. Hepatobiliary: Liver is 14.3 cm length. Some images may suggest nodular surface changes of cirrhosis, not seen in 2011. No focal lesion is seen without contrast. There is a 2.4 cm stone in the gallbladder. No biliary dilatation or gallbladder thickening is seen. Pancreas: No visible focal abnormality without contrast. Spleen: Enlarged, measuring 15.9 cm in length, otherwise unremarkable without contrast. Adrenals/Urinary Tract: There is no adrenal mass, no focal abnormality of unenhanced renal cortex but there is bilateral cortical thinning noted. There are no stones or hydronephrosis. Stomach/Bowel: There is a moderately thickened stomach, diffusely. Small hiatal hernia with thickened distal thoracic esophagus. No small bowel obstruction or inflammation is seen. There are mild mesenteric congestive features and mild body wall anasarca in the lower abdomen. There are scattered colonic diverticula without evidence of diverticulitis. Vascular/Lymphatic: Aortic atherosclerosis. No enlarged abdominal or pelvic lymph nodes. The hepatic portal vein is prominent measuring 15.2 mm AP. There is at least partial recanalization of the umbilical vein. There is an engorged splenic vein. Reproductive: Mildly enlarged prostate gland. Right testicle  appears to be retracted into the inguinal canal. Other: There is moderate free ascites in the abdomen and pelvis. Musculoskeletal: There is osteopenia with degenerative changes in the lumbar spine and multilevel endplate Schmorl's nodes. IMPRESSION: 1. Cardiomegaly, with aortic ectasia and aortic valve replacement. 2. Pulmonary fibrosis with honeycombing, upper lobe predominance. Breathing motion limits assessment for subtle infiltrates. Some of the subpleural interstitial thickening in the bases could be due to interstitial edema is there are small right greater than left pleural effusions. 3. 1.6 x 1.5 cm pleural based opacity  in the posterior right upper lobe which could be an inflammatory nodule, nodular atelectasis or scarring or neoplasm. PET-CT or three-month follow-up CT recommended. 4. New significant change in mildly prominent mediastinal lymph nodes. 5. Enlarged spleen, prominent portal vein and some images suggesting cirrhosis of the liver, with moderate free ascites. 6. Prominent distal esophageal and gastric wall, which could be due to portal hypertension or esophagogastritis. 7. Prostatomegaly with probable retraction of the right testicle into the right inguinal canal. 8. Mesenteric congestive features and mild lower abdominal body wall anasarca. 9. Cholelithiasis and remaining findings discussed above Electronically Signed   By: Telford Nab M.D.   On: 02/11/2021 05:37    EKG: Independently reviewed.  Sinus rhythm 56 bpm.  Assessment/Plan Active Problems:   Acute CHF (congestive heart failure) (HCC)    Acute diastolic CHF exacerbation -Prior 2D echocardiogram 07/2009 with LVEF 60-65% with grade 1 diastolic dysfunction and moderate to severe aortic valve regurgitation noted, he has had replacement since then -BNP 221.3 noted as well as findings of cardiomegaly and ascites -Daily weights, strict I's and O's, fluid restriction -Lasix 20 mg IV twice daily to start -Repeat 2D  echocardiogram -Monitor on telemetry  History of aortic valve replacement/dyslipidemia/hypertension -Continue metoprolol, aspirin, pravastatin -Not currently on anticoagulation  Paroxysmal atrial fibrillation-currently sinus rhythm -Appears not to be on anticoagulation -Continue metoprolol for heart rate control and monitor on telemetry  CKD stage IIIb-4 -Baseline creatinine 2.1-2.3, currently 2.2 -Continue close monitoring on diuresis  -Continue sodium bicarbonate tablets  Posterior right upper lobe lung mass -Concerning for possible neoplasm -Plan for 85-month CT/PET CT follow-up -Prior history of tobacco abuse, not currently  History of type 2 diabetes -Hold home medications -SSI  History of COPD with chronic hypoxemia -History of long-haul COVID -Normally wears 2-3 liters nasal cannula at home -No acute bronchospasms noted, continue to monitor  GERD -PPI  Thrombocytopenia -Likely related to liver cirrhosis/splenomegaly -Appears to be chronic -Avoid heparin agents -Continue to monitor with repeat CBC  Liver cirrhosis -Likely warrants further follow-up with GI outpatient -Noted prominent hepatic portal vein, will order liver Doppler  Chronic anemia -Likely related to advanced CKD, continue to monitor -No overt bleeding noted  History of gout -No current exacerbation, continue home medications  DVT prophylaxis: SCDs Code Status: Full Family Communication: Daughter-in-law at bedside Disposition Plan: Admit for evaluation of CHF/diuresis Consults called: None Admission status: Inpatient, telemetry  Karolyn Messing D Chisom Aust DO [facility]  If 7PM-7AM, please contact night-coverage [email or web address]  02/11/2021, 9:14 AM

## 2021-02-11 NOTE — Progress Notes (Signed)
Patient has lasix and metoprolol due, BP soft, provider notified.

## 2021-02-11 NOTE — ED Provider Notes (Signed)
[facility] EMERGENCY DEPARTMENT Provider Note   CSN: [id number] Arrival date & time: 02/10/21  1501     History Chief Complaint  Patient presents with   Leg Swelling   Shortness of Breath   History provided by the patient and his daughter-in-law with the assistance of [place] interpreter number [phone]. Ryan Burke is a 80 y.o. male who presents with his daughter in law at the bedside for concern for gradually worsening shortness of breath for last week as well as progressive swelling that started in the feet and has made its way up to his abdomen.  Significant abdominal distention, decreased appetite, and abdominal discomfort over the last week.  No history of swelling. Patient states he does have oxygen with 2 L by nasal cannula at home for comfort with history of chronic bronchitis.  Has inhalers at home but they are not helping as much this past week. Denies any chest pain, palpitations, nausea, vomiting, fevers, chills, sick contacts.   I personally reviewed this patient's medical records.  He has history of mitral and aortic regurg, status post AVR.  History of A. 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  History of type 2 diabetes.  He is not anticoagulated .  Echocardiogram in September 2021 revealed LVEF of 50 to 55%.  Collateral history provided by the patient's daughter-in-law who states that he is eating and drinking less than his typical but otherwise has no further insight into the patient's presenting concern today.  HPI     Past Medical History:  Diagnosis Date   Aortic regurgitation    Asthma    Chest pain at rest    Chronic bronchitis ([place])    Chronic kidney disease    Diabetes mellitus without complication ([place])    "has had it before; he's ok now; not on RC anymore" (08/27/2016)   GERD (gastroesophageal reflux disease)    Gout    Heart murmur    Hyperlipidemia    Hypertension    Mitral regurgitation     Seizures ([place]) 03/2013 X 1   "maybe because of a high fever"   Shortness of breath    "w/activity" (08/27/2016)    Patient Active Problem List   Diagnosis Date Noted   Acute CHF (congestive heart failure) ([place]) 02/11/2021   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   Exertional dyspnea 12/13/2019   Acute respiratory disease due to COVID-19 virus 02/06/2019   Acute bronchitis 08/27/2016   Seizures ([place]) 04/19/2013   Gout 04/19/2013   S/P AVR 05/08/2012   Atrial fibrillation ([place]) 05/08/2012   Hyperlipidemia    Aortic regurgitation    Mitral regurgitation    ELEVATED PROSTATE SPECIFIC ANTIGEN 09/24/2009   PROTEINURIA 09/20/2009   AORTIC INSUFFICIENCY, MODERATE 08/02/2009   HYPERGLYCEMIA 07/24/2009   LIVER FUNCTION TESTS, ABNORMAL, HX OF 07/24/2009   Essential hypertension, benign 07/22/2009    Past Surgical History:  Procedure Laterality Date   ABDOMINAL AORTAGRAM N/A 04/12/2012   Procedure: ABDOMINAL Maxcine Ham;  Surgeon: Laverda Page, MD;  Location: [facility] CATH LAB;  Service: Cardiovascular;  Laterality: N/A;   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   Procedure: AORTIC VALVE REPLACEMENT (AVR);  Surgeon: Gaye Pollack, MD;  Location: [facility];  Service: Open Heart Surgery;  Laterality: N/A;   CARDIAC CATHETERIZATION     CARDIAC VALVE REPLACEMENT     CATARACT EXTRACTION W/ INTRAOCULAR LENS  IMPLANT, BILATERAL Bilateral  INTRAOPERATIVE TRANSESOPHAGEAL ECHOCARDIOGRAM  05/04/2012   Procedure: INTRAOPERATIVE TRANSESOPHAGEAL ECHOCARDIOGRAM;  Surgeon: Gaye Pollack, MD;  Location: [facility] OR;  Service: Open Heart Surgery;  Laterality: N/A;   LEFT AND RIGHT HEART CATHETERIZATION WITH CORONARY ANGIOGRAM N/A 04/12/2012   Procedure: LEFT AND RIGHT HEART CATHETERIZATION WITH CORONARY ANGIOGRAM;  Surgeon: Laverda Page, MD;  Location: [facility] CATH LAB;  Service: Cardiovascular;  Laterality: N/A;     Family History  Problem  Relation Age of Onset   Heart disease Sister     Social History   Tobacco Use   Smoking status: Former    Packs/day: 0.75    Years: 42.00    Pack years: 31.50    Types: Cigarettes    Start date: 1961    Quit date: 03/30/2001    Years since quitting: 19.8   Smokeless tobacco: Former    Types: Nurse, children's Use: Never used  Substance Use Topics   Alcohol use: Yes    Alcohol/week: 1.0 standard drink    Types: 1 Cans of beer per week   Drug use: No    Home Medications Prior to Admission medications   Medication Sig Start Date End Date Taking? Authorizing Provider  acetaminophen (TYLENOL) 500 MG tablet Take 500 mg by mouth every 6 (six) hours as needed for moderate pain or headache.   Yes [provider]  albuterol (VENTOLIN HFA) 108 (90 Base) MCG/ACT inhaler Inhale 2 puffs into the lungs every 6 (six) hours as needed for wheezing or shortness of breath. 10/14/20  Yes Margaretha Seeds, MD  aspirin EC 81 MG tablet Take 81 mg by mouth daily.   Yes [provider]  budesonide-formoterol (SYMBICORT) 160-4.5 MCG/ACT inhaler Inhale 2 puffs into the lungs in the morning and at bedtime. 10/14/20  Yes Margaretha Seeds, MD  cetirizine (ZYRTEC) 10 MG tablet Take 10 mg by mouth daily as needed for allergies. 12/29/18  Yes [provider]  colchicine 0.6 MG tablet Take 0.6 mg by mouth 2 (two) times daily as needed (gout).   Yes [provider]  doxazosin (CARDURA) 4 MG tablet Take 4 mg by mouth daily.   Yes [provider]  FARXIGA 5 MG TABS tablet Take 5 mg by mouth daily. 12/22/18  Yes [provider]  JANUVIA 50 MG tablet Take 50 mg by mouth daily. 02/01/19  Yes [provider]  metoprolol tartrate (LOPRESSOR) 25 MG tablet Take 1 tablet (25 mg total) by mouth 2 (two) times daily. 05/08/12  Yes Barrett, Erin R, PA-C  nitroGLYCERIN (NITROSTAT) 0.4 MG SL tablet Place 0.4 mg under the tongue every 5 (five) minutes as needed for  chest pain.   Yes [provider]  omeprazole (PRILOSEC) 20 MG capsule Take 20 mg by mouth 2 (two) times daily.    Yes [provider]  pravastatin (PRAVACHOL) 20 MG tablet Take 20 mg by mouth every evening.   Yes [provider]  cefdinir (OMNICEF) 300 MG capsule Take 1 capsule (300 mg total) by mouth daily. Patient not taking: No sig reported 10/24/20   Ripley Fraise, MD  doxycycline (VIBRAMYCIN) 100 MG capsule Take 1 capsule (100 mg total) by mouth 2 (two) times daily. One po bid x 7 days Patient not taking: No sig reported 10/24/20   Ripley Fraise, MD  oxyCODONE (ROXICODONE) 5 MG immediate release tablet Take 1 tablet (5 mg total) by mouth every 6 (six) hours as needed for severe pain. Patient  not taking: No sig reported 04/17/13   Blanchie Dessert, MD  sodium bicarbonate 650 MG tablet Take 1 tablet (650 mg total) by mouth 2 (two) times daily. Patient not taking: No sig reported 02/14/19   Jonetta Osgood, MD    Allergies    Patient has no known allergies.  Review of Systems   Review of Systems  Constitutional:  Positive for activity change, appetite change, chills and fatigue. Negative for fever.  HENT: Negative.    Eyes: Negative.   Respiratory:  Positive for shortness of breath. Negative for cough and chest tightness.   Cardiovascular:  Positive for leg swelling. Negative for chest pain and palpitations.  Gastrointestinal:  Positive for abdominal distention and diarrhea. Negative for abdominal pain, nausea and vomiting.  Genitourinary: Negative.   Musculoskeletal: Negative.   Skin: Negative.   Neurological:  Positive for weakness.   Physical Exam Updated Vital Signs BP (!) 167/86   Pulse (!) 51   Temp 98 F (36.7 C) (Oral)   Resp 13   SpO2 100%   Physical Exam Vitals and nursing note reviewed.  Constitutional:      General: He is not in acute distress.    Appearance: He is ill-appearing. He is not toxic-appearing.  HENT:     Head:  Normocephalic and atraumatic.     Nose: Nose normal.     Mouth/Throat:     Mouth: Mucous membranes are moist.     Pharynx: Oropharynx is clear. Uvula midline. No oropharyngeal exudate or posterior oropharyngeal erythema.     Tonsils: No tonsillar exudate.  Eyes:     General: Lids are normal. Vision grossly intact.        Right eye: No discharge.        Left eye: No discharge.     Extraocular Movements: Extraocular movements intact.     Conjunctiva/sclera: Conjunctivae normal.     Pupils: Pupils are equal, round, and reactive to light.  Neck:     Trachea: Trachea and phonation normal.  Cardiovascular:     Rate and Rhythm: Normal rate and regular rhythm.     Pulses: Normal pulses.     Heart sounds: Normal heart sounds. No murmur heard. Pulmonary:     Effort: Tachypnea, accessory muscle usage and prolonged expiration present. No respiratory distress.     Breath sounds: Examination of the right-upper field reveals wheezing. Examination of the left-upper field reveals wheezing. Examination of the right-middle field reveals wheezing. Examination of the left-middle field reveals wheezing. Examination of the right-lower field reveals wheezing. Examination of the left-lower field reveals wheezing. Wheezing present. No rales.  Chest:     Chest wall: No mass, lacerations, deformity, swelling, tenderness, crepitus or edema.  Abdominal:     General: Bowel sounds are normal. There is distension.     Palpations: Abdomen is soft.     Tenderness: There is no abdominal tenderness. There is no right CVA tenderness, left CVA tenderness, guarding or rebound.  Musculoskeletal:        General: No deformity.     Cervical back: Normal range of motion and neck supple. No edema, rigidity or crepitus. No pain with movement, spinous process tenderness or muscular tenderness.     Right lower leg: 2+ Pitting Edema present.     Left lower leg: 2+ Pitting Edema present.  Lymphadenopathy:     Cervical: No cervical  adenopathy.  Skin:    General: Skin is warm and dry.     Capillary Refill: Capillary  refill takes less than 2 seconds.  Neurological:     General: No focal deficit present.     Mental Status: He is alert and oriented to person, place, and time. Mental status is at baseline.     GCS: GCS eye subscore is 4. GCS verbal subscore is 5. GCS motor subscore is 6.  Psychiatric:        Mood and Affect: Mood normal.    ED Results / Procedures / Treatments   Labs (all labs ordered are listed, but only abnormal results are displayed) Labs Reviewed  CBC WITH DIFFERENTIAL/PLATELET - Abnormal; Notable for the following components:      Result Value   WBC 3.2 (*)    RBC 3.88 (*)    Hemoglobin 11.3 (*)    HCT 35.3 (*)    Platelets 62 (*)    All other components within normal limits  COMPREHENSIVE METABOLIC PANEL - Abnormal; Notable for the following components:   Chloride 115 (*)    CO2 21 (*)    Glucose, Bld 133 (*)    Creatinine, Ser 2.21 (*)    Calcium 7.7 (*)    Total Protein 5.2 (*)    Albumin 2.4 (*)    AST 42 (*)    Alkaline Phosphatase 182 (*)    GFR, Estimated 30 (*)    Anion gap 4 (*)    All other components within normal limits  BRAIN NATRIURETIC PEPTIDE - Abnormal; Notable for the following components:   B Natriuretic Peptide 221.3 (*)    All other components within normal limits  RESP PANEL BY RT-PCR (FLU A&B, COVID) ARPGX2  TROPONIN I (HIGH SENSITIVITY)  TROPONIN I (HIGH SENSITIVITY)    EKG EKG Interpretation  Date/Time:  Tuesday February 11 2021 03:54:07 EST Ventricular Rate:  56 PR Interval:  172 QRS Duration: 109 QT Interval:  450 QTC Calculation: 435 R Axis:   11 Text Interpretation: Sinus rhythm Atrial premature complex Confirmed by Gerlene Fee [phone]) on 02/11/2021 3:58:26 AM  Radiology CT Abdomen Pelvis Wo Contrast  Result Date: 02/11/2021 CLINICAL DATA:  Chest pain, shortness of breath and abdominal swelling. EXAM: CT CHEST, ABDOMEN AND PELVIS  WITHOUT CONTRAST TECHNIQUE: Multidetector CT imaging of the chest, abdomen and pelvis was performed following the standard protocol without IV contrast. COMPARISON:  A CTA chest study 11/07/2009 FINDINGS: CT CHEST FINDINGS Cardiovascular: The heart is moderately enlarged. No pericardial effusion is seen. 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 There are scattered aortic calcific plaques. The pulmonary trunk is upper-normal caliber. Mediastinum/Nodes: Enlarged lower right paratracheal lymph node today measures 1.5 x 1.5 cm, previously 1.5 x 1.3 cm. There is a slightly prominent lymph node just above this which is unchanged. No other adenopathy seen without contrast. Lungs/Pleura: There is extensive coarse chronic interstitial change, with cystic honeycombing predominating in the upper lobes but involving all pulmonary segments. This has progressed since 2011. Some of the subpleural interstitial thickening in the lower zones could be due to interstitial edema. Is difficult to assess for subtle infiltrates in the study due to respiratory motion. There is a focal posterior right upper lobe pleural based opacity measuring 1.6 x 1.5 cm which could be a pulmonary mass, focal scarring or infiltrate and was not present in 2011. There are  small right greater than left layering pleural effusions. Musculoskeletal: There are degenerative changes of the thoracic spine. CT ABDOMEN PELVIS FINDINGS: Images are grainy due to patient body habitus, ascites and technique. Hepatobiliary: Liver is 14.3 cm length. Some images may suggest nodular surface changes of cirrhosis, not seen in 2011. No focal lesion is seen without contrast. There is a 2.4 cm stone in the gallbladder. No biliary dilatation or gallbladder  thickening is seen. Pancreas: No visible focal abnormality without contrast. Spleen: Enlarged, measuring 15.9 cm in length, otherwise unremarkable without contrast. Adrenals/Urinary Tract: There is no adrenal mass, no focal abnormality of unenhanced renal cortex but there is bilateral cortical thinning noted. There are no stones or hydronephrosis. Stomach/Bowel: There is a moderately thickened stomach, diffusely. Small hiatal hernia with thickened distal thoracic esophagus. No small bowel obstruction or inflammation is seen. There are mild mesenteric congestive features and mild body wall anasarca in the lower abdomen. There are scattered colonic diverticula without evidence of diverticulitis. Vascular/Lymphatic: Aortic atherosclerosis. No enlarged abdominal or pelvic lymph nodes. The hepatic portal vein is prominent measuring 15.2 mm AP. There is at least partial recanalization of the umbilical vein. There is an engorged splenic vein. Reproductive: Mildly enlarged prostate gland. Right testicle appears to be retracted into the inguinal canal. Other: There is moderate free ascites in the abdomen and pelvis. Musculoskeletal: There is osteopenia with degenerative changes in the lumbar spine and multilevel endplate Schmorl's nodes. IMPRESSION: 1. Cardiomegaly, with aortic ectasia and aortic valve replacement. 2. Pulmonary fibrosis with honeycombing, upper lobe predominance. Breathing motion limits assessment for subtle infiltrates. Some of the subpleural interstitial thickening in the bases could be due to interstitial edema is there are small right greater than left pleural effusions. 3. 1.6 x 1.5 cm pleural based opacity in the posterior right upper lobe which could be an inflammatory nodule, nodular atelectasis or scarring or neoplasm. PET-CT or three-month follow-up CT recommended. 4. New significant change in mildly prominent mediastinal lymph nodes. 5. Enlarged spleen, prominent portal vein and some images  suggesting cirrhosis of the liver, with moderate free ascites. 6. Prominent distal esophageal and gastric wall, which could be due to portal hypertension or esophagogastritis. 7. Prostatomegaly with probable retraction of the right testicle into the right inguinal canal. 8. Mesenteric congestive features and mild lower abdominal body wall anasarca. 9. Cholelithiasis and remaining findings discussed above Electronically Signed   By: Telford Nab M.D.   On: 02/11/2021 05:37   DG Chest 2 View  Result Date: 02/10/2021 CLINICAL DATA:  Acute shortness of breath for 3 days. EXAM: CHEST - 2 VIEW COMPARISON:  10/23/2020 FINDINGS: Cardiomegaly and cardiac valve replacement changes noted. Diffuse bilateral interstitial opacities are unchanged. No pleural effusion or pneumothorax identified. No acute bony abnormalities are noted. IMPRESSION: Cardiomegaly with chronic diffuse bilateral interstitial opacities. Electronically Signed   By: Margarette Canada M.D.   On: 02/10/2021 17:42   CT Chest Wo Contrast  Result Date: 02/11/2021 CLINICAL DATA:  Chest pain, shortness of breath and abdominal swelling. EXAM: CT CHEST, ABDOMEN AND PELVIS WITHOUT CONTRAST TECHNIQUE: Multidetector CT imaging of the chest, abdomen and pelvis was performed following the standard protocol without IV contrast. COMPARISON:  A CTA chest study 11/07/2009 FINDINGS: CT CHEST FINDINGS Cardiovascular: The heart is moderately enlarged. No pericardial effusion is seen. There are sternotomy sutures, prosthetic aortic valve which has been seen on more recent chest films, apparent sleeve repair of the ascending aorta with thoracic aortic ectasia with the ascending segment 4.1 cm, the  arch 3.4 cm and the descending segment up to 3.7 cm. There are scattered aortic calcific plaques. The pulmonary trunk is upper-normal caliber. Mediastinum/Nodes: Enlarged lower right paratracheal lymph node today measures 1.5 x 1.5 cm, previously 1.5 x 1.3 cm. There is a slightly  prominent lymph node just above this which is unchanged. No other adenopathy seen without contrast. Lungs/Pleura: There is extensive coarse chronic interstitial change, with cystic honeycombing predominating in the upper lobes but involving all pulmonary segments. This has progressed since 2011. Some of the subpleural interstitial thickening in the lower zones could be due to interstitial edema. Is difficult to assess for subtle infiltrates in the study due to respiratory motion. There is a focal posterior right upper lobe pleural based opacity measuring 1.6 x 1.5 cm which could be a pulmonary mass, focal scarring or infiltrate and was not present in 2011. There are small right greater than left layering pleural effusions. Musculoskeletal: There are degenerative changes of the thoracic spine. CT ABDOMEN PELVIS FINDINGS: Images are grainy due to patient body habitus, ascites and technique. Hepatobiliary: Liver is 14.3 cm length. Some images may suggest nodular surface changes of cirrhosis, not seen in 2011. No focal lesion is seen without contrast. There is a 2.4 cm stone in the gallbladder. No biliary dilatation or gallbladder thickening is seen. Pancreas: No visible focal abnormality without contrast. Spleen: Enlarged, measuring 15.9 cm in length, otherwise unremarkable without contrast. Adrenals/Urinary Tract: There is no adrenal mass, no focal abnormality of unenhanced renal cortex but there is bilateral cortical thinning noted. There are no stones or hydronephrosis. Stomach/Bowel: There is a moderately thickened stomach, diffusely. Small hiatal hernia with thickened distal thoracic esophagus. No small bowel obstruction or inflammation is seen. There are mild mesenteric congestive features and mild body wall anasarca in the lower abdomen. There are scattered colonic diverticula without evidence of diverticulitis. Vascular/Lymphatic: Aortic atherosclerosis. No enlarged abdominal or pelvic lymph nodes. The hepatic  portal vein is prominent measuring 15.2 mm AP. There is at least partial recanalization of the umbilical vein. There is an engorged splenic vein. Reproductive: Mildly enlarged prostate gland. Right testicle appears to be retracted into the inguinal canal. Other: There is moderate free ascites in the abdomen and pelvis. Musculoskeletal: There is osteopenia with degenerative changes in the lumbar spine and multilevel endplate Schmorl's nodes. IMPRESSION: 1. Cardiomegaly, with aortic ectasia and aortic valve replacement. 2. Pulmonary fibrosis with honeycombing, upper lobe predominance. Breathing motion limits assessment for subtle infiltrates. Some of the subpleural interstitial thickening in the bases could be due to interstitial edema is there are small right greater than left pleural effusions. 3. 1.6 x 1.5 cm pleural based opacity in the posterior right upper lobe which could be an inflammatory nodule, nodular atelectasis or scarring or neoplasm. PET-CT or three-month follow-up CT recommended. 4. New significant change in mildly prominent mediastinal lymph nodes. 5. Enlarged spleen, prominent portal vein and some images suggesting cirrhosis of the liver, with moderate free ascites. 6. Prominent distal esophageal and gastric wall, which could be due to portal hypertension or esophagogastritis. 7. Prostatomegaly with probable retraction of the right testicle into the right inguinal canal. 8. Mesenteric congestive features and mild lower abdominal body wall anasarca. 9. Cholelithiasis and remaining findings discussed above Electronically Signed   By: Telford Nab M.D.   On: 02/11/2021 05:37    Procedures Procedures   Medications Ordered in ED Medications  furosemide (LASIX) injection 20 mg (has no administration in time range)  ipratropium-albuterol (DUONEB) 0.5-2.5 (3) MG/3ML  nebulizer solution 3 mL (3 mLs Nebulization Given 02/11/21 0446)    ED Course  I have reviewed the triage vital signs and the  nursing notes.  Pertinent labs & imaging results that were available during my care of the patient were reviewed by me and considered in my medical decision making (see chart for details).  Clinical Course as of 02/11/21 1950  Tue Feb 11, 2021  0559 Consult to hospitalist, Dr. Hal Hope, who is agreeable to seeing this patient and admitting him to his service.  [RS]    Clinical Course User Index [RS] Jahmel Flannagan, Sharlene Dory   MDM Rules/Calculators/A&P                         80 year old male who presents with concern for abdominal distention, bilateral leg swelling, SOB, and weakness x 1 week.   Differential for this patient's symptoms is broad and includes but is not limited to cirrhosis, malignancy, heart failure, pulmonary edema, pleural effusion,, SBP, bowel obstruction.  Hypertensive and tachypneic on intake.  Oxygen saturation 9% on 2 L abdominal oxygen by nasal cannula.  Cardiac exam unremarkable, pulmonary exam with wheezing throughout the lung fields bilaterally.  Abdominal exam with significant distention and generalized discomfort to palpation.  Bilateral lower extremity edema 2+ with pitting.  Basic labs obtained from triage.  CBC with leukopenia 3.2.  Mild anemia with hemoglobin of 11 at patient's baseline.  Thrombocytopenia of 62,000.  This appears to have chronically been decreasing for last 9 months.  CMP with creatinine of 2.2 near patient's baseline, AST elevated to 42.  Alk phos elevated to 182.  BNP mildly elevated to 221. Troponin is negative, respiratory pathogen panel is negative.  Will proceed with DuoNeb for wheezing in the lungs, additionally will CT scan the chest, abdomen, and pelvis for further evaluation of this patient's presentation.  Hemodynamically stable at this time.  CT scans with findings concerning for possible pulmonary malignancy, pleural effusions, cirrhosis, and moderate ascites as well as anasarca of the abdominal wall.  Do feel patient benefit  from mission to the hospital for management of his multifactorial shortness of breath and decompensation.  Lasix is ordered.  Consult to hospitalist as above. [place] and his family voiced understanding of his medical evaluation and treatment plan. Each of their questions was answered to their expressed satisfaction. They are amenable to plan for admission at this time.  This chart was dictated using voice recognition software, Dragon. Despite the best efforts of this provider to proofread and correct errors, errors may still occur which can change documentation meaning.  Final Clinical Impression(s) / ED Diagnoses Final diagnoses:  None    Rx / DC Orders ED Discharge Orders     None        Emeline Darling, PA-C 02/11/21 9326    Maudie Flakes, MD 02/11/21 [id number]

## 2021-02-11 NOTE — Progress Notes (Signed)
  Echocardiogram 2D Echocardiogram has been performed.  Delcie Roch 02/11/2021, 5:00 PM

## 2021-02-11 NOTE — ED Notes (Signed)
Taken to ultrasound by transporter.

## 2021-02-11 NOTE — ED Notes (Signed)
Pt voided twice at this time, unmeasured as pt had 2 Bms as well. MD notified.

## 2021-02-12 ENCOUNTER — Observation Stay (HOSPITAL_COMMUNITY): Payer: Medicare Other

## 2021-02-12 DIAGNOSIS — D696 Thrombocytopenia, unspecified: Secondary | ICD-10-CM | POA: Diagnosis present

## 2021-02-12 DIAGNOSIS — R918 Other nonspecific abnormal finding of lung field: Secondary | ICD-10-CM | POA: Diagnosis present

## 2021-02-12 DIAGNOSIS — Z8249 Family history of ischemic heart disease and other diseases of the circulatory system: Secondary | ICD-10-CM | POA: Diagnosis not present

## 2021-02-12 DIAGNOSIS — E1122 Type 2 diabetes mellitus with diabetic chronic kidney disease: Secondary | ICD-10-CM | POA: Diagnosis present

## 2021-02-12 DIAGNOSIS — R161 Splenomegaly, not elsewhere classified: Secondary | ICD-10-CM | POA: Diagnosis present

## 2021-02-12 DIAGNOSIS — Z20822 Contact with and (suspected) exposure to covid-19: Secondary | ICD-10-CM | POA: Diagnosis present

## 2021-02-12 DIAGNOSIS — R188 Other ascites: Secondary | ICD-10-CM | POA: Diagnosis present

## 2021-02-12 DIAGNOSIS — J449 Chronic obstructive pulmonary disease, unspecified: Secondary | ICD-10-CM | POA: Diagnosis present

## 2021-02-12 DIAGNOSIS — R0602 Shortness of breath: Secondary | ICD-10-CM | POA: Diagnosis present

## 2021-02-12 DIAGNOSIS — K219 Gastro-esophageal reflux disease without esophagitis: Secondary | ICD-10-CM | POA: Diagnosis present

## 2021-02-12 DIAGNOSIS — E785 Hyperlipidemia, unspecified: Secondary | ICD-10-CM | POA: Diagnosis present

## 2021-02-12 DIAGNOSIS — N1832 Chronic kidney disease, stage 3b: Secondary | ICD-10-CM | POA: Diagnosis present

## 2021-02-12 DIAGNOSIS — K7460 Unspecified cirrhosis of liver: Secondary | ICD-10-CM | POA: Diagnosis not present

## 2021-02-12 DIAGNOSIS — Z953 Presence of xenogenic heart valve: Secondary | ICD-10-CM | POA: Diagnosis not present

## 2021-02-12 DIAGNOSIS — K7469 Other cirrhosis of liver: Secondary | ICD-10-CM | POA: Diagnosis present

## 2021-02-12 DIAGNOSIS — J9611 Chronic respiratory failure with hypoxia: Secondary | ICD-10-CM | POA: Diagnosis present

## 2021-02-12 DIAGNOSIS — I130 Hypertensive heart and chronic kidney disease with heart failure and stage 1 through stage 4 chronic kidney disease, or unspecified chronic kidney disease: Secondary | ICD-10-CM | POA: Diagnosis present

## 2021-02-12 DIAGNOSIS — M109 Gout, unspecified: Secondary | ICD-10-CM | POA: Diagnosis present

## 2021-02-12 DIAGNOSIS — Z6831 Body mass index (BMI) 31.0-31.9, adult: Secondary | ICD-10-CM | POA: Diagnosis not present

## 2021-02-12 DIAGNOSIS — Z87891 Personal history of nicotine dependence: Secondary | ICD-10-CM | POA: Diagnosis not present

## 2021-02-12 DIAGNOSIS — U099 Post covid-19 condition, unspecified: Secondary | ICD-10-CM | POA: Diagnosis present

## 2021-02-12 DIAGNOSIS — I5033 Acute on chronic diastolic (congestive) heart failure: Secondary | ICD-10-CM | POA: Diagnosis present

## 2021-02-12 DIAGNOSIS — I480 Paroxysmal atrial fibrillation: Secondary | ICD-10-CM | POA: Diagnosis present

## 2021-02-12 DIAGNOSIS — D631 Anemia in chronic kidney disease: Secondary | ICD-10-CM | POA: Diagnosis present

## 2021-02-12 DIAGNOSIS — E669 Obesity, unspecified: Secondary | ICD-10-CM | POA: Diagnosis present

## 2021-02-12 HISTORY — PX: IR PARACENTESIS: IMG2679

## 2021-02-12 LAB — LACTATE DEHYDROGENASE, PLEURAL OR PERITONEAL FLUID: LD, Fluid: 49 U/L — ABNORMAL HIGH (ref 3–23)

## 2021-02-12 LAB — BASIC METABOLIC PANEL
Anion gap: 7 (ref 5–15)
BUN: 16 mg/dL (ref 8–23)
CO2: 22 mmol/L (ref 22–32)
Calcium: 7.7 mg/dL — ABNORMAL LOW (ref 8.9–10.3)
Chloride: 112 mmol/L — ABNORMAL HIGH (ref 98–111)
Creatinine, Ser: 2.31 mg/dL — ABNORMAL HIGH (ref 0.61–1.24)
GFR, Estimated: 28 mL/min — ABNORMAL LOW (ref >60)
Glucose, Bld: 90 mg/dL (ref 70–99)
Potassium: 3.7 mmol/L (ref 3.5–5.1)
Sodium: 141 mmol/L (ref 135–145)

## 2021-02-12 LAB — ALBUMIN, PLEURAL OR PERITONEAL FLUID: Albumin, Fluid: <1.5 g/dL

## 2021-02-12 LAB — CBC
HCT: 31.8 % — ABNORMAL LOW (ref 39.0–52.0)
Hemoglobin: 10.6 g/dL — ABNORMAL LOW (ref 13.0–17.0)
MCH: 29.4 pg (ref 26.0–34.0)
MCHC: 33.3 g/dL (ref 30.0–36.0)
MCV: 88.3 fL (ref 80.0–100.0)
Platelets: 55 10*3/uL — ABNORMAL LOW (ref 150–400)
RBC: 3.6 MIL/uL — ABNORMAL LOW (ref 4.22–5.81)
RDW: 15.2 % (ref 11.5–15.5)
WBC: 2.9 10*3/uL — ABNORMAL LOW (ref 4.0–10.5)
nRBC: 0 % (ref 0.0–0.2)

## 2021-02-12 LAB — BODY FLUID CELL COUNT WITH DIFFERENTIAL
Eos, Fluid: 1 %
Lymphs, Fluid: 80 %
Monocyte-Macrophage-Serous Fluid: 16 % — ABNORMAL LOW (ref 50–90)
Neutrophil Count, Fluid: 3 % (ref 0–25)
Total Nucleated Cell Count, Fluid: 515 cu mm (ref 0–1000)

## 2021-02-12 LAB — GRAM STAIN

## 2021-02-12 LAB — GLUCOSE, CAPILLARY
Glucose-Capillary: 89 mg/dL (ref 70–99)
Glucose-Capillary: 100 mg/dL — ABNORMAL HIGH (ref 70–99)
Glucose-Capillary: 101 mg/dL — ABNORMAL HIGH (ref 70–99)
Glucose-Capillary: 97 mg/dL (ref 70–99)

## 2021-02-12 LAB — PROTEIN, PLEURAL OR PERITONEAL FLUID: Total protein, fluid: <3 g/dL

## 2021-02-12 LAB — GLUCOSE, PLEURAL OR PERITONEAL FLUID: Glucose, Fluid: 111 mg/dL

## 2021-02-12 LAB — HEMOGLOBIN A1C
Hgb A1c MFr Bld: 5.7 % — ABNORMAL HIGH (ref 4.8–5.6)
Mean Plasma Glucose: 116.89 mg/dL

## 2021-02-12 LAB — MAGNESIUM: Magnesium: 1.2 mg/dL — ABNORMAL LOW (ref 1.7–2.4)

## 2021-02-12 MED ORDER — FUROSEMIDE 40 MG PO TABS
40.0000 mg | ORAL_TABLET | Freq: Every day | ORAL | Status: DC
  Administered 2021-02-12 – 2021-02-13 (×2): 40 mg via ORAL
  Filled 2021-02-12 (×2): qty 1

## 2021-02-12 MED ORDER — SPIRONOLACTONE 25 MG PO TABS
100.0000 mg | ORAL_TABLET | Freq: Every day | ORAL | Status: DC
  Administered 2021-02-12 – 2021-02-13 (×2): 100 mg via ORAL
  Filled 2021-02-12 (×2): qty 4

## 2021-02-12 MED ORDER — MAGNESIUM SULFATE 2 GM/50ML IV SOLN
2.0000 g | Freq: Once | INTRAVENOUS | Status: AC
  Administered 2021-02-12: 2 g via INTRAVENOUS
  Filled 2021-02-12: qty 50

## 2021-02-12 MED ORDER — LIDOCAINE HCL 1 % IJ SOLN
INTRAMUSCULAR | Status: AC
  Administered 2021-02-12: 10 mL
  Filled 2021-02-12: qty 20

## 2021-02-12 NOTE — Progress Notes (Signed)
Progress Note    Ryan Burke  [id number] DOB: Dec 29, 1940  DOA: 02/10/2021 PCP: Nolene Ebbs, MD    Brief Narrative:     Medical records reviewed and are as summarized below:  Ryan Burke is an 80 y.o. male with medical history significant for COPD with chronic hypoxemia, long-haul COVID, prior aortic valve replacement/CABG, type 2 diabetes, dyslipidemia, hypertension, gout, who lives with his son and daughter-in-law at home.  He is non-English speaking/N[place]alese and history was obtained through the assistance of his daughter-in-law at bedside (patient's preference).  He has apparently been having progressively worsening edema which initially began in his lower extremities that then progressed into his abdomen.  Appears to have cirrhosis- new diagnosis.    Assessment/Plan:   Active Problems:   Chronic hypoxemic respiratory failure (HCC)   Acute CHF (congestive heart failure) (HCC)   Other cirrhosis of liver (HCC)  Liver cirrhosis -appears to have ascites on u/s -will order paracentesis -further follow-up with GI outpatient -lasix/aldactone for now  Chronic diastolic CHF exacerbation -Prior 2D echocardiogram 07/2009 with LVEF 60-65% with grade 1 diastolic dysfunction and moderate to severe aortic valve regurgitation noted, he has had replacement since then --Daily weights, strict I's and O's, fluid restriction -echo stable -lasix/aldactone    History of aortic valve replacement/dyslipidemia/hypertension -Continue metoprolol, aspirin, pravastatin -Not currently on anticoagulation   Paroxysmal atrial fibrillation-currently sinus rhythm -Appears not to be on anticoagulation -Continue metoprolol for heart rate control and monitor on telemetry   CKD stage IIIb-4 -Baseline creatinine 2.1-2.3, currently 2.2 -Continue close monitoring on diuresis  -Continue sodium bicarbonate tablets   Posterior right upper lobe lung mass -Concerning for possible neoplasm -Plan for  61-month CT/PET CT follow-up -Prior history of tobacco abuse, not currently   History of type 2 diabetes -Hold home medications -SSI   History of COPD with chronic hypoxemia -History of long-haul COVID -Normally wears 2-3 liters nasal cannula at home -No acute bronchospasms noted, continue to monitor   GERD -PPI   Thrombocytopenia -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  Hypomagnesemia -replete   Family Communication/Anticipated D/C date and plan/Code Status   DVT prophylaxis: Lovenox ordered. Code Status: Full Code.  Family Communication: at bedside Disposition Plan: Status is: Observation  The patient will require care spanning > 2 midnights and should be moved to inpatient because: needs paracentesis        Medical Consultants:   None.    Subjective:   C/o abdominal fullness  Objective:    Vitals:   02/12/21 0355 02/12/21 0723 02/12/21 0942 02/12/21 1129  BP: 116/69 110/72 117/74 103/71  Pulse: 66 65  71  Resp: 20     Temp: 98.2 F (36.8 C) 98.6 F (37 C)  98 F (36.7 C)  TempSrc: Oral Oral  Oral  SpO2: 98% 97%  97%  Weight: 76.5 kg     Height:        Intake/Output Summary (Last 24 hours) at 02/12/2021 1301 Last data filed at 02/12/2021 1104 Gross per 24 hour  Intake 646.51 ml  Output 1345 ml  Net -698.49 ml   Filed Weights   02/11/21 1600 02/12/21 0355  Weight: 76.9 kg 76.5 kg    Exam:  General: Appearance:  Obese male in no acute distress     Lungs:     Clear to auscultation bilaterally, respirations unlabored  Heart:    Normal heart rate.    MS:   All extremities are intact. + LE edema   Neurologic:   Awake, alert     Data Reviewed:    I have personally reviewed following labs and imaging studies:  Labs: Labs show the following:   Basic Metabolic Panel: Recent Labs  Lab 02/10/21 1715 02/12/21 0239  NA 140 141  K 4.3 3.7  CL 115* 112*  CO2 21* 22  GLUCOSE 133* 90  BUN 11 16  CREATININE 2.21* 2.31*  CALCIUM 7.7* 7.7*  MG  --  1.2*   GFR Estimated Creatinine Clearance: 22.7 mL/min (A) (by C-G formula based on SCr of 2.31 mg/dL (H)). Liver Function Tests: Recent Labs  Lab 02/10/21 1715  AST 42*  ALT 25  ALKPHOS 182*  BILITOT 0.9  PROT 5.2*  ALBUMIN 2.4*   No results for input(s): LIPASE, AMYLASE in the last 168 hours. No results for input(s): AMMONIA in the last 168 hours. Coagulation profile No results for input(s): INR, PROTIME in the last 168 hours.  CBC: Recent Labs  Lab 02/10/21 1715 02/12/21 0239  WBC 3.2* 2.9*  NEUTROABS 1.7  --   HGB 11.3* 10.6*  HCT 35.3* 31.8*  MCV 91.0 88.3  PLT 62* 55*   Cardiac Enzymes: No results for input(s): CKTOTAL, CKMB, CKMBINDEX, TROPONINI in the last 168 hours. BNP (last 3 results) No results for input(s): PROBNP in the last 8760 hours. CBG: Recent Labs  Lab 02/11/21 1221 02/11/21 1744 02/11/21 2112 02/12/21 0633 02/12/21 1059  GLUCAP 92 100* 127* 89 100*   D-Dimer: No results for input(s): DDIMER in the last 72 hours. Hgb A1c: Recent Labs    02/12/21 0239  HGBA1C 5.7*   Lipid Profile: No results for input(s): CHOL, HDL, LDLCALC, TRIG, CHOLHDL, LDLDIRECT in the last 72 hours. Thyroid function studies: No results for input(s): TSH, T4TOTAL, T3FREE, THYROIDAB in the last 72 hours.  Invalid input(s): FREET3 Anemia work up: No results for input(s): VITAMINB12, FOLATE, FERRITIN, TIBC, IRON, RETICCTPCT in the last 72 hours. Sepsis Labs: Recent Labs  Lab 02/10/21 1715 02/12/21 0239  WBC 3.2* 2.9*    Microbiology Recent Results (from the past 240 hour(s))  Resp Panel by RT-PCR (Flu A&B, Covid) Nasopharyngeal Swab     Status: None    Collection Time: 02/11/21  4:08 AM   Specimen: Nasopharyngeal Swab; Nasopharyngeal(NP) swabs in vial transport medium  Result Value Ref Range Status   SARS Coronavirus 2 by RT PCR NEGATIVE NEGATIVE Final    Comment: (NOTE) SARS-CoV-2 target nucleic acids are NOT DETECTED.  The SARS-CoV-2 RNA is generally detectable in upper respiratory specimens during the acute phase of infection. The lowest concentration of SARS-CoV-2 viral copies this assay can detect is 138 copies/mL. A negative result does not preclude SARS-Cov-2 infection and should not be used as the sole basis for treatment or other patient management decisions. A negative result may occur with  improper specimen collection/handling, submission of specimen other than nasopharyngeal swab, presence of viral mutation(s) within the areas targeted by this assay, and inadequate number of viral copies(<138 copies/mL). A negative result must be combined with clinical observations, patient history, and epidemiological information. The expected result is Negative.  Fact Sheet for Patients:  EntrepreneurPulse.com.au  Fact Sheet for Healthcare Providers:  IncredibleEmployment.be  This test is no t yet approved or  cleared by the [place] and  has been authorized for detection and/or diagnosis of SARS-CoV-2 by FDA under an Emergency Use Authorization (EUA). This EUA will remain  in effect (meaning this test can be used) for the duration of the COVID-19 declaration under Section 564(b)(1) of the Act, 21 U.S.C.section 360bbb-3(b)(1), unless the authorization is terminated  or revoked sooner.       Influenza A by PCR NEGATIVE NEGATIVE Final   Influenza B by PCR NEGATIVE NEGATIVE Final    Comment: (NOTE) The Xpert Xpress SARS-CoV-2/FLU/RSV plus assay is intended as an aid in the diagnosis of influenza from Nasopharyngeal swab specimens and should not be used as a sole basis for treatment.  Nasal washings and aspirates are unacceptable for Xpert Xpress SARS-CoV-2/FLU/RSV testing.  Fact Sheet for Patients: EntrepreneurPulse.com.au  Fact Sheet for Healthcare Providers: IncredibleEmployment.be  This test is not yet approved or cleared by the [place] FDA and has been authorized for detection and/or diagnosis of SARS-CoV-2 by FDA under an Emergency Use Authorization (EUA). This EUA will remain in effect (meaning this test can be used) for the duration of the COVID-19 declaration under Section 564(b)(1) of the Act, 21 U.S.C. section 360bbb-3(b)(1), unless the authorization is terminated or revoked.  Performed at [facility], [place] [address], [place], [place] [address]     Procedures and diagnostic studies:  CT Abdomen Pelvis Wo Contrast  Result Date: 02/11/2021 CLINICAL DATA:  Chest pain, shortness of breath and abdominal swelling. EXAM: CT CHEST, ABDOMEN AND PELVIS WITHOUT CONTRAST TECHNIQUE: Multidetector CT imaging of the chest, abdomen and pelvis was performed following the standard protocol without IV contrast. COMPARISON:  A CTA chest study 11/07/2009 FINDINGS: CT CHEST FINDINGS Cardiovascular: The heart is moderately enlarged. No pericardial effusion is seen. 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 There are scattered aortic calcific plaques. The pulmonary trunk is upper-normal caliber. Mediastinum/Nodes: Enlarged lower right paratracheal lymph node today measures 1.5 x 1.5 cm, previously 1.5 x 1.3 cm. There is a slightly prominent lymph node just above this which is unchanged. No other adenopathy seen without contrast. Lungs/Pleura: There is extensive coarse chronic interstitial change, with cystic  honeycombing predominating in the upper lobes but involving all pulmonary segments. This has progressed since 2011. Some of the subpleural interstitial thickening in the lower zones could be due to interstitial edema. Is difficult to assess for subtle infiltrates in the study due to respiratory motion. There is a focal posterior right upper lobe pleural based opacity measuring 1.6 x 1.5 cm which could be a pulmonary mass, focal scarring or infiltrate and was not present in 2011. There are small right greater than left layering pleural effusions. Musculoskeletal: There are degenerative changes of the thoracic spine. CT ABDOMEN PELVIS FINDINGS: Images are grainy due to patient body habitus, ascites and technique. Hepatobiliary: Liver is 14.3 cm length. Some images may suggest nodular surface changes of cirrhosis, not seen in 2011. No focal lesion is seen without contrast. There is a 2.4 cm stone in the gallbladder. No biliary dilatation or gallbladder thickening is seen. Pancreas: No visible focal abnormality without contrast. Spleen: Enlarged, measuring 15.9 cm in length, otherwise unremarkable without contrast. Adrenals/Urinary Tract: There is no adrenal mass, no focal abnormality of unenhanced renal cortex but there  is bilateral cortical thinning noted. There are no stones or hydronephrosis. Stomach/Bowel: There is a moderately thickened stomach, diffusely. Small hiatal hernia with thickened distal thoracic esophagus. No small bowel obstruction or inflammation is seen. There are mild mesenteric congestive features and mild body wall anasarca in the lower abdomen. There are scattered colonic diverticula without evidence of diverticulitis. Vascular/Lymphatic: Aortic atherosclerosis. No enlarged abdominal or pelvic lymph nodes. The hepatic portal vein is prominent measuring 15.2 mm AP. There is at least partial recanalization of the umbilical vein. There is an engorged splenic vein. Reproductive: Mildly enlarged  prostate gland. Right testicle appears to be retracted into the inguinal canal. Other: There is moderate free ascites in the abdomen and pelvis. Musculoskeletal: There is osteopenia with degenerative changes in the lumbar spine and multilevel endplate Schmorl's nodes. IMPRESSION: 1. Cardiomegaly, with aortic ectasia and aortic valve replacement. 2. Pulmonary fibrosis with honeycombing, upper lobe predominance. Breathing motion limits assessment for subtle infiltrates. Some of the subpleural interstitial thickening in the bases could be due to interstitial edema is there are small right greater than left pleural effusions. 3. 1.6 x 1.5 cm pleural based opacity in the posterior right upper lobe which could be an inflammatory nodule, nodular atelectasis or scarring or neoplasm. PET-CT or three-month follow-up CT recommended. 4. New significant change in mildly prominent mediastinal lymph nodes. 5. Enlarged spleen, prominent portal vein and some images suggesting cirrhosis of the liver, with moderate free ascites. 6. Prominent distal esophageal and gastric wall, which could be due to portal hypertension or esophagogastritis. 7. Prostatomegaly with probable retraction of the right testicle into the right inguinal canal. 8. Mesenteric congestive features and mild lower abdominal body wall anasarca. 9. Cholelithiasis and remaining findings discussed above Electronically Signed   By: Telford Nab M.D.   On: 02/11/2021 05:37   DG Chest 2 View  Result Date: 02/10/2021 CLINICAL DATA:  Acute shortness of breath for 3 days. EXAM: CHEST - 2 VIEW COMPARISON:  10/23/2020 FINDINGS: Cardiomegaly and cardiac valve replacement changes noted. Diffuse bilateral interstitial opacities are unchanged. No pleural effusion or pneumothorax identified. No acute bony abnormalities are noted. IMPRESSION: Cardiomegaly with chronic diffuse bilateral interstitial opacities. Electronically Signed   By: Margarette Canada M.D.   On: 02/10/2021 17:42    CT Chest Wo Contrast  Result Date: 02/11/2021 CLINICAL DATA:  Chest pain, shortness of breath and abdominal swelling. EXAM: CT CHEST, ABDOMEN AND PELVIS WITHOUT CONTRAST TECHNIQUE: Multidetector CT imaging of the chest, abdomen and pelvis was performed following the standard protocol without IV contrast. COMPARISON:  A CTA chest study 11/07/2009 FINDINGS: CT CHEST FINDINGS Cardiovascular: The heart is moderately enlarged. No pericardial effusion is seen. 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 There are scattered aortic calcific plaques. The pulmonary trunk is upper-normal caliber. Mediastinum/Nodes: Enlarged lower right paratracheal lymph node today measures 1.5 x 1.5 cm, previously 1.5 x 1.3 cm. There is a slightly prominent lymph node just above this which is unchanged. No other adenopathy seen without contrast. Lungs/Pleura: There is extensive coarse chronic interstitial change, with cystic honeycombing predominating in the upper lobes but involving all pulmonary segments. This has progressed since 2011. Some of the subpleural interstitial thickening in the lower zones could be due to interstitial edema. Is difficult to assess for subtle infiltrates  in the study due to respiratory motion. There is a focal posterior right upper lobe pleural based opacity measuring 1.6 x 1.5 cm which could be a pulmonary mass, focal scarring or infiltrate and was not present in 2011. There are small right greater than left layering pleural effusions. Musculoskeletal: There are degenerative changes of the thoracic spine. CT ABDOMEN PELVIS FINDINGS: Images are grainy due to patient body habitus, ascites and technique. Hepatobiliary: Liver is 14.3 cm length. Some images may suggest nodular surface  changes of cirrhosis, not seen in 2011. No focal lesion is seen without contrast. There is a 2.4 cm stone in the gallbladder. No biliary dilatation or gallbladder thickening is seen. Pancreas: No visible focal abnormality without contrast. Spleen: Enlarged, measuring 15.9 cm in length, otherwise unremarkable without contrast. Adrenals/Urinary Tract: There is no adrenal mass, no focal abnormality of unenhanced renal cortex but there is bilateral cortical thinning noted. There are no stones or hydronephrosis. Stomach/Bowel: There is a moderately thickened stomach, diffusely. Small hiatal hernia with thickened distal thoracic esophagus. No small bowel obstruction or inflammation is seen. There are mild mesenteric congestive features and mild body wall anasarca in the lower abdomen. There are scattered colonic diverticula without evidence of diverticulitis. Vascular/Lymphatic: Aortic atherosclerosis. No enlarged abdominal or pelvic lymph nodes. The hepatic portal vein is prominent measuring 15.2 mm AP. There is at least partial recanalization of the umbilical vein. There is an engorged splenic vein. Reproductive: Mildly enlarged prostate gland. Right testicle appears to be retracted into the inguinal canal. Other: There is moderate free ascites in the abdomen and pelvis. Musculoskeletal: There is osteopenia with degenerative changes in the lumbar spine and multilevel endplate Schmorl's nodes. IMPRESSION: 1. Cardiomegaly, with aortic ectasia and aortic valve replacement. 2. Pulmonary fibrosis with honeycombing, upper lobe predominance. Breathing motion limits assessment for subtle infiltrates. Some of the subpleural interstitial thickening in the bases could be due to interstitial edema is there are small right greater than left pleural effusions. 3. 1.6 x 1.5 cm pleural based opacity in the posterior right upper lobe which could be an inflammatory nodule, nodular atelectasis or scarring or neoplasm. PET-CT or  three-month follow-up CT recommended. 4. New significant change in mildly prominent mediastinal lymph nodes. 5. Enlarged spleen, prominent portal vein and some images suggesting cirrhosis of the liver, with moderate free ascites. 6. Prominent distal esophageal and gastric wall, which could be due to portal hypertension or esophagogastritis. 7. Prostatomegaly with probable retraction of the right testicle into the right inguinal canal. 8. Mesenteric congestive features and mild lower abdominal body wall anasarca. 9. Cholelithiasis and remaining findings discussed above Electronically Signed   By: Telford Nab M.D.   On: 02/11/2021 05:37   ECHOCARDIOGRAM COMPLETE  Result Date: 02/11/2021    ECHOCARDIOGRAM REPORT   Patient Name:   DEMETRIK LATTANZI Date of Exam: 02/11/2021 Medical Rec #:  [id number]     Height:       61.0 in Accession #:    [id number]    Weight:       163.2 lb Date of Birth:  1941-03-18      BSA:          1.732 m Patient Age:    42 years      BP:           130/73 mmHg Patient Gender: M             HR:           54 bpm. Exam Location:  Inpatient Procedure: 2D Echo Indications:     acute diastolic chf  History:         Patient has prior history of Echocardiogram examinations, most                  recent 08/01/2009. Arrythmias:Atrial Fibrillation; Risk                  Factors:Hypertension, Dyslipidemia, Diabetes and Former Smoker.                  Aortic Valve: 23 mm Magna bioprosthetic valve is present in the                  aortic position.  Sonographer:     Johny Chess RDCS Referring Phys:  [id number] Royanne Foots [facility] Diagnosing Phys: Adrian Prows MD  Sonographer Comments: Suboptimal subcostal window. IMPRESSIONS  1. Left ventricular ejection fraction, by estimation, is 55 to 60%. The left ventricle has normal function. The left ventricle has no regional wall motion abnormalities. There is mild concentric left ventricular hypertrophy. Left ventricular diastolic function could not be evaluated.  2. Right  ventricular systolic function is normal. The right ventricular size is mildly enlarged. There is normal pulmonary artery systolic pressure.  3. Left atrial size was moderately dilated.  4. The mitral valve is normal in structure. No evidence of mitral valve regurgitation. No evidence of mitral stenosis.  5. The aortic valve is tricuspid. Aortic valve regurgitation is not visualized. No aortic stenosis is present. There is a 23 mm Magna bioprosthetic valve present in the aortic position. Comparison(s): No significant change from prior study. 12/27/2019. FINDINGS  Left Ventricle: Left ventricular ejection fraction, by estimation, is 55 to 60%. The left ventricle has normal function. The left ventricle has no regional wall motion abnormalities. The left ventricular internal cavity size was normal in size. There is  mild concentric left ventricular hypertrophy. Left ventricular diastolic function could not be evaluated. Right Ventricle: The right ventricular size is mildly enlarged. No increase in right ventricular wall thickness. Right ventricular systolic function is normal. There is normal pulmonary artery systolic pressure. The tricuspid regurgitant velocity is 2.44  m/s, and with an assumed right atrial pressure of 3 mmHg, the estimated right ventricular systolic pressure is 0000000 mmHg. Left Atrium: Left atrial size was moderately dilated. Right Atrium: Right atrial size was normal in size. Pericardium: There is no evidence of pericardial effusion. Mitral Valve: The mitral valve is normal in structure. No evidence of mitral valve regurgitation. No evidence of mitral valve stenosis. Tricuspid Valve: The tricuspid valve is normal in structure. Tricuspid valve regurgitation is mild . No evidence of tricuspid stenosis. Aortic Valve: The aortic valve is tricuspid. Aortic valve regurgitation is not visualized. No aortic stenosis is present. Aortic valve mean gradient measures 11.0 mmHg. Aortic valve peak gradient measures  19.4 mmHg. Aortic valve area, by VTI measures 1.95 cm. There is a 23 mm Magna bioprosthetic valve present in the aortic position. Pulmonic Valve: The pulmonic valve was normal in structure. Pulmonic valve regurgitation is not visualized. No evidence of pulmonic stenosis. Aorta: The aortic root is normal in size and structure. IAS/Shunts: No atrial level shunt detected by color flow Doppler.  LEFT VENTRICLE PLAX 2D LVIDd:         4.80 cm   Diastology LVIDs:         3.40 cm   LV e' medial:    5.66 cm/s LV PW:  1.10 cm   LV E/e' medial:  14.6 LV IVS:        1.20 cm   LV e' lateral:   10.10 cm/s LVOT diam:     2.10 cm   LV E/e' lateral: 8.2 LV SV:         91 LV SV Index:   52 LVOT Area:     3.46 cm  RIGHT VENTRICLE RV S prime:     14.40 cm/s TAPSE (M-mode): 1.1 cm LEFT ATRIUM             Index LA diam:        4.30 cm 2.48 cm/m LA Vol (A2C):   54.6 ml 31.52 ml/m LA Vol (A4C):   75.7 ml 43.70 ml/m LA Biplane Vol: 66.1 ml 38.16 ml/m  AORTIC VALVE AV Area (Vmax):    1.54 cm AV Area (Vmean):   1.43 cm AV Area (VTI):     1.95 cm AV Vmax:           220.00 cm/s AV Vmean:          149.000 cm/s AV VTI:            0.467 m AV Peak Grad:      19.4 mmHg AV Mean Grad:      11.0 mmHg LVOT Vmax:         97.65 cm/s LVOT Vmean:        61.600 cm/s LVOT VTI:          0.262 m LVOT/AV VTI ratio: 0.56  AORTA Ao Asc diam: 3.50 cm MITRAL VALVE               TRICUSPID VALVE MV Area (PHT): 2.62 cm    TR Peak grad:   23.8 mmHg MV Decel Time: 290 msec    TR Vmax:        244.00 cm/s MV E velocity: 82.50 cm/s MV A velocity: 79.20 cm/s  SHUNTS MV E/A ratio:  1.04        Systemic VTI:  0.26 m                            Systemic Diam: 2.10 cm Adrian Prows MD Electronically signed by Adrian Prows MD Signature Date/Time: 02/11/2021/10:15:37 PM    Final    US LIVER DOPPLER  Result Date: 02/11/2021 CLINICAL DATA:  80 year old male with history of cirrhosis. EXAM: DUPLEX ULTRASOUND OF LIVER TECHNIQUE: Color and duplex Doppler ultrasound was  performed to evaluate the hepatic in-flow and out-flow vessels. COMPARISON:  CT abdomen pelvis from earlier the same day, abdominal ultrasound from 04/19/2013. FINDINGS: Liver: Normal echogenicity. Coarsened echotexture. Nodular contour. The right lobe appears shrunken. No focal lesion, mass or intrahepatic biliary ductal dilatation. Gallbladder: Decompressed with mild diffuse wall thickening. There is a shadowing gallstone in the infundibulum measuring up to 1.1 cm. Main Portal Vein size: 1.3 cm Portal Vein Velocities (all antegrade) Main Prox:  30 cm/sec Main Mid: 25 cm/sec Main Dist:  22 cm/sec Right: 18 cm/sec Left: 20 cm/sec Hepatic Vein Velocities (all antegrade) Right:  16 cm/sec Middle:  33 cm/sec Left:  38 cm/sec IVC: Present and patent with normal respiratory phasicity. Hepatic Artery Velocity: Not visualized. Splenic Vein Velocity:  24 cm/sec Spleen: 16.3 cm x 15 cm x 10.5 cm with a total volume of 1,340 cm^3 (411 cm^3 is upper limit normal) Portal Vein Occlusion/Thrombus: No Splenic Vein Occlusion/Thrombus: No Ascites: Moderate volume perihepatic  ascites is visualized. Varices: None IMPRESSION: 1. Morphologic changes of hepatic cirrhosis and secondary signs of portal hypertension including splenomegaly and moderate ascites. The portal and hepatic veins are patent with normal directions of flow. 2. Cholelithiasis without sonographic evidence of cholecystitis. Marliss Coots, MD [facility] Radiology Electronically Signed   By: Marliss Coots M.D.   On: 02/11/2021 13:22    Medications:    aspirin EC  81 mg Oral Daily   furosemide  40 mg Oral Daily   insulin aspart  0-5 Units Subcutaneous QHS   insulin aspart  0-9 Units Subcutaneous TID WC   lidocaine       loratadine  10 mg Oral Daily   metoprolol tartrate  25 mg Oral BID   mometasone-formoterol  2 puff Inhalation BID   pantoprazole  40 mg Oral Daily   pravastatin  20 mg Oral QPM   sodium chloride  flush  3 mL Intravenous Q12H   spironolactone  100 mg Oral Daily   Continuous Infusions:  sodium chloride       LOS: 0 days   Joseph Art  [facility]   How to contact the [facility] Attending or Consulting provider 7A - 7P or covering provider during after hours 7P -7A, for this patient?  Check the care team in [facility] and look for a) attending/consulting TRH provider listed and b) the [facility] team listed Log into [email or web address] and use [place] universal password to access. If you do not have the password, please contact the hospital operator. Locate the [facility] provider you are looking for under [facility] and page to a number that you can be directly reached. If you still have difficulty reaching the provider, please page the [facility] (Director on Call) for the Hospitalists listed on amion for assistance.  02/12/2021, 1:01 PM

## 2021-02-12 NOTE — Procedures (Signed)
PROCEDURE SUMMARY:  Successful US guided diagnostic and therapeutic paracentesis from the RLQ. Yielded 3.1 L of hazy, yellow fluid.  No immediate complications.  Pt tolerated well.   Specimen was sent for labs.  EBL < 1 mL  Shon Hough, AGNP 02/12/2021 3:14 PM

## 2021-02-12 NOTE — TOC Progression Note (Signed)
Transition of Care [facility]) - Progression Note    Patient Details  Name: Ryan Burke MRN: [id number] Date of Birth: February 02, 1941  Transition of Care [facility]) CM/SW Contact  Leone Haven, RN Phone Number: 02/12/2021, 5:11 PM  Clinical Narrative:    Patient is from home, son at bedside states he has a walker and a cane at home and has home oxygen with [employer] 2 liters.  They will bring his oxygen tank at dc to transport him home.  He does not have any issues with medications , or transportation.  TOC will cont to follow for dc needs. Patient speaks [place]. Son speaks [place].        Expected Discharge Plan and Services                                                 Social Determinants of Health (SDOH) Interventions    Readmission Risk Interventions No flowsheet data found.

## 2021-02-12 NOTE — Plan of Care (Signed)
  Problem: Education: Goal: Knowledge of General Education information will improve Description: Including pain rating scale, medication(s)/side effects and non-pharmacologic comfort measures Outcome: Progressing   Problem: Health Behavior/Discharge Planning: Goal: Ability to manage health-related needs will improve Outcome: Progressing   Problem: Clinical Measurements: Goal: Cardiovascular complication will be avoided Outcome: Progressing   Problem: Clinical Measurements: Goal: Respiratory complications will improve Outcome: Progressing   Problem: Activity: Goal: Risk for activity intolerance will decrease Outcome: Progressing

## 2021-02-12 NOTE — Progress Notes (Signed)
Heart Failure Navigator Progress Note  Assessed for Heart & Vascular TOC clinic readiness.  Patient does not meet criteria due to chronic dHF, LVEF WNL, s/p AVR. Appears to have new dx of cirrhosis this admission, planned paracentesis.   Attempted to educate ondiet/fluid modifications--pt off unit in IR for paracentesis.   Navigator available for reassessment of patient.   Ozella Rocks, MSN, RN Heart Failure Nurse Navigator [phone]

## 2021-02-13 LAB — GLUCOSE, CAPILLARY
Glucose-Capillary: 95 mg/dL (ref 70–99)
Glucose-Capillary: 128 mg/dL — ABNORMAL HIGH (ref 70–99)
Glucose-Capillary: 148 mg/dL — ABNORMAL HIGH (ref 70–99)
Glucose-Capillary: 99 mg/dL (ref 70–99)

## 2021-02-13 LAB — CBC
HCT: 32.4 % — ABNORMAL LOW (ref 39.0–52.0)
Hemoglobin: 10.6 g/dL — ABNORMAL LOW (ref 13.0–17.0)
MCH: 29 pg (ref 26.0–34.0)
MCHC: 32.7 g/dL (ref 30.0–36.0)
MCV: 88.5 fL (ref 80.0–100.0)
Platelets: 49 10*3/uL — ABNORMAL LOW (ref 150–400)
RBC: 3.66 MIL/uL — ABNORMAL LOW (ref 4.22–5.81)
RDW: 15 % (ref 11.5–15.5)
WBC: 3.3 10*3/uL — ABNORMAL LOW (ref 4.0–10.5)
nRBC: 0 % (ref 0.0–0.2)

## 2021-02-13 LAB — BASIC METABOLIC PANEL
Anion gap: 6 (ref 5–15)
BUN: 16 mg/dL (ref 8–23)
CO2: 23 mmol/L (ref 22–32)
Calcium: 7.7 mg/dL — ABNORMAL LOW (ref 8.9–10.3)
Chloride: 109 mmol/L (ref 98–111)
Creatinine, Ser: 2.52 mg/dL — ABNORMAL HIGH (ref 0.61–1.24)
GFR, Estimated: 25 mL/min — ABNORMAL LOW (ref >60)
Glucose, Bld: 98 mg/dL (ref 70–99)
Potassium: 3.4 mmol/L — ABNORMAL LOW (ref 3.5–5.1)
Sodium: 138 mmol/L (ref 135–145)

## 2021-02-13 LAB — MAGNESIUM: Magnesium: 1.4 mg/dL — ABNORMAL LOW (ref 1.7–2.4)

## 2021-02-13 LAB — CYTOLOGY - NON PAP

## 2021-02-13 MED ORDER — MAGNESIUM SULFATE 4 GM/100ML IV SOLN
4.0000 g | Freq: Once | INTRAVENOUS | Status: AC
  Administered 2021-02-13: 09:00:00 4 g via INTRAVENOUS
  Filled 2021-02-13: qty 100

## 2021-02-13 MED ORDER — POTASSIUM CHLORIDE CRYS ER 20 MEQ PO TBCR
40.0000 meq | EXTENDED_RELEASE_TABLET | Freq: Once | ORAL | Status: AC
  Administered 2021-02-13: 09:00:00 40 meq via ORAL
  Filled 2021-02-13: qty 2

## 2021-02-13 NOTE — Progress Notes (Signed)
Progress Note    Ryan Burke  [id number] DOB: 01/27/1941  DOA: 02/10/2021 PCP: Nolene Ebbs, MD    Brief Narrative:     Medical records reviewed and are as summarized below:  Ryan Burke is an 80 y.o. male with medical history significant for COPD with chronic hypoxemia, long-haul COVID, prior aortic valve replacement/CABG, type 2 diabetes, dyslipidemia, hypertension, gout, who lives with his son and daughter-in-law at home.  He is non-English speaking/N[place]alese and history was obtained through the assistance of his daughter-in-law at bedside (patient's preference).  He has apparently been having progressively worsening edema which initially began in his lower extremities that then progressed into his abdomen.  Appears to have cirrhosis- new diagnosis.    Assessment/Plan:   Active Problems:   Chronic hypoxemic respiratory failure (HCC)   Acute CHF (congestive heart failure) (HCC)   Other cirrhosis of liver (HCC)   Cirrhosis (HCC)  Liver cirrhosis -appears to have ascites on u/s -s/p paracentesis- 3L removed -further follow-up with GI outpatient -lasix/aldactone - monitor Cr and electrolytes  Chronic diastolic CHF exacerbation -Prior 2D echocardiogram 07/2009 with LVEF 60-65% with grade 1 diastolic dysfunction and moderate to severe aortic valve regurgitation noted, he has had replacement since then --Daily weights, strict I's and O's, fluid restriction -echo stable -lasix/aldactone    History of aortic valve replacement/dyslipidemia/hypertension -Continue metoprolol, aspirin, pravastatin -Not currently on anticoagulation   Paroxysmal atrial fibrillation-currently sinus rhythm -Appears not to be on anticoagulation -Continue metoprolol for heart rate control and monitor on telemetry   CKD stage IIIb-4 -Baseline creatinine 2.1-2.3, currently 2.2 -Continue close monitoring on diuresis  -Continue sodium bicarbonate tablets   Posterior right upper lobe lung  mass -Concerning for possible neoplasm -Plan for 66-month CT/PET CT follow-up -Prior history of tobacco abuse, not currently   History of type 2 diabetes -Hold home medications -SSI   History of COPD with chronic hypoxemia -History of long-haul COVID -Normally wears 2-3 liters nasal cannula at home -No acute bronchospasms noted, continue to monitor   GERD -PPI   Thrombocytopenia -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  Hypomagnesemia -replete again   Family Communication/Anticipated D/C date and plan/Code Status   DVT prophylaxis: Lovenox ordered. Code Status: Full Code.  Family Communication: at bedside Disposition Plan: Status is: inpt  The patient will require care spanning > 2 midnights and should be moved to inpatient because: needs management of cirrhosis         Medical Consultants:   None.    Subjective:   Abdomen feels less full  Objective:    Vitals:   02/12/21 2020 02/13/21 0527 02/13/21 0837 02/13/21 0901  BP:  123/74 120/76   Pulse:  (!) 58 65   Resp:  18 16   Temp:  98 F (36.7 C) 97.7 F (36.5 C)   TempSrc:  Oral Oral   SpO2: 98% 98% 98% 96%  Weight:  73.2 kg    Height:        Intake/Output Summary (Last 24 hours) at 02/13/2021 1218 Last data filed at 02/13/2021 0837 Gross per 24 hour  Intake 720 ml  Output 425 ml  Net 295 ml   Filed Weights   02/11/21 1600 02/12/21 0355 02/13/21 0527  Weight: 76.9 kg 76.5 kg 73.2 kg    Exam:  General: Appearance:    Obese male in no acute distress     Lungs:     respirations unlabored  Heart:    Normal heart rate.    MS:   All extremities are intact.  + LE edema   Neurologic:   Awake, alert,  oriented x 3.      Data Reviewed:   I have personally reviewed following labs and imaging studies:  Labs: Labs show the following:   Basic Metabolic Panel: Recent Labs  Lab 02/10/21 1715 02/12/21 0239 02/13/21 0223  NA 140 141 138  K 4.3 3.7 3.4*  CL 115* 112* 109  CO2 21* 22 23  GLUCOSE 133* 90 98  BUN 11 16 16   CREATININE 2.21* 2.31* 2.52*  CALCIUM 7.7* 7.7* 7.7*  MG  --  1.2* 1.4*   GFR Estimated Creatinine Clearance: 20.4 mL/min (A) (by C-G formula based on SCr of 2.52 mg/dL (H)). Liver Function Tests: Recent Labs  Lab 02/10/21 1715  AST 42*  ALT 25  ALKPHOS 182*  BILITOT 0.9  PROT 5.2*  ALBUMIN 2.4*   No results for input(s): LIPASE, AMYLASE in the last 168 hours. No results for input(s): AMMONIA in the last 168 hours. Coagulation profile No results for input(s): INR, PROTIME in the last 168 hours.  CBC: Recent Labs  Lab 02/10/21 1715 02/12/21 0239 02/13/21 0223  WBC 3.2* 2.9* 3.3*  NEUTROABS 1.7  --   --   HGB 11.3* 10.6* 10.6*  HCT 35.3* 31.8* 32.4*  MCV 91.0 88.3 88.5  PLT 62* 55* 49*   Cardiac Enzymes: No results for input(s): CKTOTAL, CKMB, CKMBINDEX, TROPONINI in the last 168 hours. BNP (last 3 results) No results for input(s): PROBNP in the last 8760 hours. CBG: Recent Labs  Lab 02/12/21 1059 02/12/21 1652 02/12/21 2049 02/13/21 0611 02/13/21 1116  GLUCAP 100* 101* 97 95 128*   D-Dimer: No results for input(s): DDIMER in the last 72 hours. Hgb A1c: Recent Labs    02/12/21 0239  HGBA1C 5.7*   Lipid Profile: No results for input(s): CHOL, HDL, LDLCALC, TRIG, CHOLHDL, LDLDIRECT in the last 72 hours. Thyroid function studies: No results for input(s): TSH, T4TOTAL, T3FREE, THYROIDAB in the last 72 hours.  Invalid input(s): FREET3 Anemia work up: No results for input(s): VITAMINB12, FOLATE, FERRITIN, TIBC, IRON, RETICCTPCT in the last 72 hours. Sepsis Labs: Recent Labs  Lab 02/10/21 1715 02/12/21 0239 02/13/21 0223   WBC 3.2* 2.9* 3.3*    Microbiology Recent Results (from the past 240 hour(s))  Resp Panel by RT-PCR (Flu A&B, Covid) Nasopharyngeal Swab     Status: None   Collection Time: 02/11/21  4:08 AM   Specimen: Nasopharyngeal Swab; Nasopharyngeal(NP) swabs in vial transport medium  Result Value Ref Range Status   SARS Coronavirus 2 by RT PCR NEGATIVE NEGATIVE Final    Comment: (NOTE) SARS-CoV-2 target nucleic acids are NOT DETECTED.  The SARS-CoV-2 RNA is generally detectable in upper respiratory specimens during the acute phase of infection. The lowest concentration of SARS-CoV-2 viral copies this assay can detect is 138 copies/mL. A negative result does not preclude SARS-Cov-2 infection and should not be used as the sole basis for treatment or other patient management decisions. A negative result may occur with  improper specimen collection/handling, submission of specimen other than nasopharyngeal swab, presence of viral mutation(s) within the areas targeted by this assay, and inadequate number of viral copies(<138 copies/mL). A  negative result must be combined with clinical observations, patient history, and epidemiological information. The expected result is Negative.  Fact Sheet for Patients:  EntrepreneurPulse.com.au  Fact Sheet for Healthcare Providers:  IncredibleEmployment.be  This test is no t yet approved or cleared by the [place] FDA and  has been authorized for detection and/or diagnosis of SARS-CoV-2 by FDA under an Emergency Use Authorization (EUA). This EUA will remain  in effect (meaning this test can be used) for the duration of the COVID-19 declaration under Section 564(b)(1) of the Act, 21 U.S.C.section 360bbb-3(b)(1), unless the authorization is terminated  or revoked sooner.       Influenza A by PCR NEGATIVE NEGATIVE Final   Influenza B by PCR NEGATIVE NEGATIVE Final    Comment: (NOTE) The Xpert Xpress  SARS-CoV-2/FLU/RSV plus assay is intended as an aid in the diagnosis of influenza from Nasopharyngeal swab specimens and should not be used as a sole basis for treatment. Nasal washings and aspirates are unacceptable for Xpert Xpress SARS-CoV-2/FLU/RSV testing.  Fact Sheet for Patients: EntrepreneurPulse.com.au  Fact Sheet for Healthcare Providers: IncredibleEmployment.be  This test is not yet approved or cleared by the [place] FDA and has been authorized for detection and/or diagnosis of SARS-CoV-2 by FDA under an Emergency Use Authorization (EUA). This EUA will remain in effect (meaning this test can be used) for the duration of the COVID-19 declaration under Section 564(b)(1) of the Act, 21 U.S.C. section 360bbb-3(b)(1), unless the authorization is terminated or revoked.  Performed at [facility], [place] [address], [place], [place] [address]   Gram stain     Status: None   Collection Time: 02/12/21  1:43 PM   Specimen: Peritoneal Washings  Result Value Ref Range Status   Specimen Description PERITONEAL FLUID  Final   Special Requests NONE  Final   Gram Stain   Final    WBC PRESENT,BOTH PMN AND MONONUCLEAR NO ORGANISMS SEEN CYTOSPIN SMEAR Performed at [facility], [address] [place], [place] [address]    Report Status 02/12/2021 FINAL  Final    Procedures and diagnostic studies:  ECHOCARDIOGRAM COMPLETE  Result Date: 02/11/2021    ECHOCARDIOGRAM REPORT   Patient Name:   YOUSSOUF SATTLER Date of Exam: 02/11/2021 Medical Rec #:  [id number]     Height:       61.0 in Accession #:    [id number]    Weight:       163.2 lb Date of Birth:  05-01-1940      BSA:          1.732 m Patient Age:    46 years      BP:           130/73 mmHg Patient Gender: M             HR:           54 bpm. Exam Location:  Inpatient Procedure: 2D Echo Indications:     acute diastolic chf  History:         Patient has prior history of Echocardiogram  examinations, most                  recent 08/01/2009. Arrythmias:Atrial Fibrillation; Risk                  Factors:Hypertension, Dyslipidemia, Diabetes and Former Smoker.                  Aortic Valve: 23 mm Magna  bioprosthetic valve is present in the                  aortic position.  Sonographer:     Johny Chess RDCS Referring Phys:  [id number] Royanne Foots [facility] Diagnosing Phys: Adrian Prows MD  Sonographer Comments: Suboptimal subcostal window. IMPRESSIONS  1. Left ventricular ejection fraction, by estimation, is 55 to 60%. The left ventricle has normal function. The left ventricle has no regional wall motion abnormalities. There is mild concentric left ventricular hypertrophy. Left ventricular diastolic function could not be evaluated.  2. Right ventricular systolic function is normal. The right ventricular size is mildly enlarged. There is normal pulmonary artery systolic pressure.  3. Left atrial size was moderately dilated.  4. The mitral valve is normal in structure. No evidence of mitral valve regurgitation. No evidence of mitral stenosis.  5. The aortic valve is tricuspid. Aortic valve regurgitation is not visualized. No aortic stenosis is present. There is a 23 mm Magna bioprosthetic valve present in the aortic position. Comparison(s): No significant change from prior study. 12/27/2019. FINDINGS  Left Ventricle: Left ventricular ejection fraction, by estimation, is 55 to 60%. The left ventricle has normal function. The left ventricle has no regional wall motion abnormalities. The left ventricular internal cavity size was normal in size. There is  mild concentric left ventricular hypertrophy. Left ventricular diastolic function could not be evaluated. Right Ventricle: The right ventricular size is mildly enlarged. No increase in right ventricular wall thickness. Right ventricular systolic function is normal. There is normal pulmonary artery systolic pressure. The tricuspid regurgitant velocity is 2.44  m/s,  and with an assumed right atrial pressure of 3 mmHg, the estimated right ventricular systolic pressure is 0000000 mmHg. Left Atrium: Left atrial size was moderately dilated. Right Atrium: Right atrial size was normal in size. Pericardium: There is no evidence of pericardial effusion. Mitral Valve: The mitral valve is normal in structure. No evidence of mitral valve regurgitation. No evidence of mitral valve stenosis. Tricuspid Valve: The tricuspid valve is normal in structure. Tricuspid valve regurgitation is mild . No evidence of tricuspid stenosis. Aortic Valve: The aortic valve is tricuspid. Aortic valve regurgitation is not visualized. No aortic stenosis is present. Aortic valve mean gradient measures 11.0 mmHg. Aortic valve peak gradient measures 19.4 mmHg. Aortic valve area, by VTI measures 1.95 cm. There is a 23 mm Magna bioprosthetic valve present in the aortic position. Pulmonic Valve: The pulmonic valve was normal in structure. Pulmonic valve regurgitation is not visualized. No evidence of pulmonic stenosis. Aorta: The aortic root is normal in size and structure. IAS/Shunts: No atrial level shunt detected by color flow Doppler.  LEFT VENTRICLE PLAX 2D LVIDd:         4.80 cm   Diastology LVIDs:         3.40 cm   LV e' medial:    5.66 cm/s LV PW:         1.10 cm   LV E/e' medial:  14.6 LV IVS:        1.20 cm   LV e' lateral:   10.10 cm/s LVOT diam:     2.10 cm   LV E/e' lateral: 8.2 LV SV:         91 LV SV Index:   52 LVOT Area:     3.46 cm  RIGHT VENTRICLE RV S prime:     14.40 cm/s TAPSE (M-mode): 1.1 cm LEFT ATRIUM  Index LA diam:        4.30 cm 2.48 cm/m LA Vol (A2C):   54.6 ml 31.52 ml/m LA Vol (A4C):   75.7 ml 43.70 ml/m LA Biplane Vol: 66.1 ml 38.16 ml/m  AORTIC VALVE AV Area (Vmax):    1.54 cm AV Area (Vmean):   1.43 cm AV Area (VTI):     1.95 cm AV Vmax:           220.00 cm/s AV Vmean:          149.000 cm/s AV VTI:            0.467 m AV Peak Grad:      19.4 mmHg AV Mean Grad:       11.0 mmHg LVOT Vmax:         97.65 cm/s LVOT Vmean:        61.600 cm/s LVOT VTI:          0.262 m LVOT/AV VTI ratio: 0.56  AORTA Ao Asc diam: 3.50 cm MITRAL VALVE               TRICUSPID VALVE MV Area (PHT): 2.62 cm    TR Peak grad:   23.8 mmHg MV Decel Time: 290 msec    TR Vmax:        244.00 cm/s MV E velocity: 82.50 cm/s MV A velocity: 79.20 cm/s  SHUNTS MV E/A ratio:  1.04        Systemic VTI:  0.26 m                            Systemic Diam: 2.10 cm Yates Decamp MD Electronically signed by Yates Decamp MD Signature Date/Time: 02/11/2021/10:15:37 PM    Final    US LIVER DOPPLER  Result Date: 02/11/2021 CLINICAL DATA:  80 year old male with history of cirrhosis. EXAM: DUPLEX ULTRASOUND OF LIVER TECHNIQUE: Color and duplex Doppler ultrasound was performed to evaluate the hepatic in-flow and out-flow vessels. COMPARISON:  CT abdomen pelvis from earlier the same day, abdominal ultrasound from 04/19/2013. FINDINGS: Liver: Normal echogenicity. Coarsened echotexture. Nodular contour. The right lobe appears shrunken. No focal lesion, mass or intrahepatic biliary ductal dilatation. Gallbladder: Decompressed with mild diffuse wall thickening. There is a shadowing gallstone in the infundibulum measuring up to 1.1 cm. Main Portal Vein size: 1.3 cm Portal Vein Velocities (all antegrade) Main Prox:  30 cm/sec Main Mid: 25 cm/sec Main Dist:  22 cm/sec Right: 18 cm/sec Left: 20 cm/sec Hepatic Vein Velocities (all antegrade) Right:  16 cm/sec Middle:  33 cm/sec Left:  38 cm/sec IVC: Present and patent with normal respiratory phasicity. Hepatic Artery Velocity: Not visualized. Splenic Vein Velocity:  24 cm/sec Spleen: 16.3 cm x 15 cm x 10.5 cm with a total volume of 1,340 cm^3 (411 cm^3 is upper limit normal) Portal Vein Occlusion/Thrombus: No Splenic Vein Occlusion/Thrombus: No Ascites: Moderate volume perihepatic ascites is visualized. Varices: None IMPRESSION: 1. Morphologic changes of hepatic cirrhosis and secondary signs of  portal hypertension including splenomegaly and moderate ascites. The portal and hepatic veins are patent with normal directions of flow. 2. Cholelithiasis without sonographic evidence of cholecystitis. Marliss Coots, MD Vascular and Interventional Radiology Specialists [facility] Radiology Electronically Signed   By: Marliss Coots M.D.   On: 02/11/2021 13:22   IR Paracentesis  Result Date: 02/12/2021 INDICATION: Patient with history of DM type 2, HTN and new diagnosis of cirrhosis of liver. Request was made for IR to perform  a diagnostic and therapeutic paracentesis. EXAM: ULTRASOUND GUIDED DIAGNOSTIC AND THERAPEUTIC PARACENTESIS MEDICATIONS: 10 mL 1% lidocaine COMPLICATIONS: None immediate. PROCEDURE: Informed written consent was obtained from the patient after a discussion of the risks, benefits and alternatives to treatment. A timeout was performed prior to the initiation of the procedure. Initial ultrasound scanning demonstrates a large amount of ascites within the right lower abdominal quadrant. The right lower abdomen was prepped and draped in the usual sterile fashion. 1% lidocaine was used for local anesthesia. Following this, a 19 gauge, 7-cm, Yueh catheter was introduced. An ultrasound image was saved for documentation purposes. The paracentesis was performed. The catheter was removed and a dressing was applied. The patient tolerated the procedure well without immediate post procedural complication. FINDINGS: A total of approximately 3.1 L of hazy, yellow fluid was removed. Samples were sent to the laboratory as requested by the clinical team. IMPRESSION: Successful ultrasound-guided paracentesis yielding 3.1 L liters of peritoneal fluid. Read by: Narda Rutherford, NP Electronically Signed   By: Michaelle Birks M.D.   On: 02/12/2021 17:25    Medications:    aspirin EC  81 mg Oral Daily   furosemide  40 mg Oral Daily   insulin aspart  0-5 Units Subcutaneous QHS   insulin aspart  0-9 Units Subcutaneous  TID WC   loratadine  10 mg Oral Daily   metoprolol tartrate  25 mg Oral BID   mometasone-formoterol  2 puff Inhalation BID   pantoprazole  40 mg Oral Daily   pravastatin  20 mg Oral QPM   sodium chloride flush  3 mL Intravenous Q12H   spironolactone  100 mg Oral Daily   Continuous Infusions:  sodium chloride       LOS: 1 day   Geradine Girt  [facility]   How to contact the [facility] Attending or Consulting provider [place] or covering provider during after hours [place], for this patient?  Check the care team in [facility] and look for a) attending/consulting TRH provider listed and b) the [facility] team listed Log into [email or web address] and use [place]'s universal password to access. If you do not have the password, please contact the hospital operator. Locate the [facility] provider you are looking for under [facility] and page to a number that you can be directly reached. If you still have difficulty reaching the provider, please page the [facility] (Director on Call) for the Hospitalists listed on amion for assistance.  02/13/2021, 12:18 PM

## 2021-02-14 DIAGNOSIS — J9611 Chronic respiratory failure with hypoxia: Secondary | ICD-10-CM

## 2021-02-14 LAB — AMMONIA: Ammonia: 63 umol/L — ABNORMAL HIGH (ref 9–35)

## 2021-02-14 LAB — CBC
HCT: 32.4 % — ABNORMAL LOW (ref 39.0–52.0)
Hemoglobin: 10.7 g/dL — ABNORMAL LOW (ref 13.0–17.0)
MCH: 29.1 pg (ref 26.0–34.0)
MCHC: 33 g/dL (ref 30.0–36.0)
MCV: 88 fL (ref 80.0–100.0)
Platelets: 50 10*3/uL — ABNORMAL LOW (ref 150–400)
RBC: 3.68 MIL/uL — ABNORMAL LOW (ref 4.22–5.81)
RDW: 14.8 % (ref 11.5–15.5)
WBC: 2.9 10*3/uL — ABNORMAL LOW (ref 4.0–10.5)
nRBC: 0 % (ref 0.0–0.2)

## 2021-02-14 LAB — BASIC METABOLIC PANEL
Anion gap: 5 (ref 5–15)
BUN: 21 mg/dL (ref 8–23)
CO2: 24 mmol/L (ref 22–32)
Calcium: 8 mg/dL — ABNORMAL LOW (ref 8.9–10.3)
Chloride: 110 mmol/L (ref 98–111)
Creatinine, Ser: 2.62 mg/dL — ABNORMAL HIGH (ref 0.61–1.24)
GFR, Estimated: 24 mL/min — ABNORMAL LOW (ref >60)
Glucose, Bld: 135 mg/dL — ABNORMAL HIGH (ref 70–99)
Potassium: 3.9 mmol/L (ref 3.5–5.1)
Sodium: 139 mmol/L (ref 135–145)

## 2021-02-14 LAB — GLUCOSE, CAPILLARY
Glucose-Capillary: 106 mg/dL — ABNORMAL HIGH (ref 70–99)
Glucose-Capillary: 115 mg/dL — ABNORMAL HIGH (ref 70–99)

## 2021-02-14 MED ORDER — FUROSEMIDE 20 MG PO TABS
20.0000 mg | ORAL_TABLET | Freq: Every day | ORAL | Status: DC
  Administered 2021-02-14: 20 mg via ORAL
  Filled 2021-02-14: qty 1

## 2021-02-14 MED ORDER — FUROSEMIDE 20 MG PO TABS: 20.0000 mg | ORAL_TABLET | Freq: Every day | ORAL | 0 refills | Status: DC

## 2021-02-14 MED ORDER — LACTULOSE 10 GM/15ML PO SOLN
10.0000 g | Freq: Every day | ORAL | Status: DC
  Administered 2021-02-14: 10 g via ORAL
  Filled 2021-02-14: qty 15

## 2021-02-14 MED ORDER — JANUVIA 50 MG PO TABS: 25.0000 mg | ORAL_TABLET | Freq: Every day | ORAL | Status: DC

## 2021-02-14 MED ORDER — LACTULOSE 10 GM/15ML PO SOLN: 10.0000 g | Freq: Every day | ORAL | 0 refills | Status: DC

## 2021-02-14 MED ORDER — SPIRONOLACTONE 50 MG PO TABS: 50.0000 mg | ORAL_TABLET | Freq: Every day | ORAL | 0 refills | Status: DC

## 2021-02-14 MED ORDER — SPIRONOLACTONE 25 MG PO TABS
50.0000 mg | ORAL_TABLET | Freq: Every day | ORAL | Status: DC
  Administered 2021-02-14: 50 mg via ORAL
  Filled 2021-02-14: qty 2

## 2021-02-14 NOTE — Plan of Care (Signed)

## 2021-02-14 NOTE — Progress Notes (Signed)
Discharge instructions given to the patient, patient's son and daughter in law.  Education emphasized on heart failure special instructions.  Encouraged to call the doctor for concerns.  Patient and family verbalized understanding.  Needs addressed.  Discharged home.

## 2021-02-14 NOTE — Discharge Summary (Signed)
Physician Discharge Summary  Ryan Burke GQQ:[id number] DOB: 1940/12/03 DOA: 02/10/2021  PCP: Fleet Contras, MD  Admit date: 02/10/2021 Discharge date: 02/14/2021  Admitted From: home Discharge disposition: home   Recommendations for Outpatient Follow-Up:   BMP/CBC 1 week-- adjust aldactone/lasix -Plan for 70-month CT/PET CT follow-up Referral made to GI   Discharge Diagnosis:   Active Problems:   Chronic hypoxemic respiratory failure (HCC)   Acute CHF (congestive heart failure) (HCC)   Other cirrhosis of liver (HCC)   Cirrhosis (HCC)    Discharge Condition: Improved.  Diet recommendation: Low sodium, heart healthy.  Wound care: None.  Code status: Full.   History of Present Illness:   Ryan Burke is a 80 y.o. male with medical history significant for COPD with chronic hypoxemia, long-haul COVID, prior aortic valve replacement/CABG, type 2 diabetes, dyslipidemia, hypertension, gout, who lives with his son and daughter-in-law at home.  He is non-English speaking/N[place]alese and history was obtained through the assistance of his daughter-in-law at bedside (patient's preference).  He has apparently been having progressively worsening edema which initially began in his lower extremities that then progressed into his abdomen.  This all occurred over an approximately 1 week time span and was associated with some slightly worsening shortness of breath.  He is noted to have chronic shortness of breath with use of 2-3 L nasal cannula at home for the last 2 years after he was initially treated for COVID.  He denies any cough or chest congestion and no fevers or chills are reported.  No nausea or vomiting.  He apparently has been eating and drinking a little bit less than his usual over the last couple days.   Hospital Course by Problem:   Liver cirrhosis -appears to have ascites on u/s -s/p paracentesis- 3L removed -further follow-up with GI outpatient -lasix/aldactone  -adjust as an outpatient    Chronic diastolic CHF exacerbation -Prior 2D echocardiogram 07/2009 with LVEF 60-65% with grade 1 diastolic dysfunction and moderate to severe aortic valve regurgitation noted, he has had replacement since then --Daily weights, strict I's and O's, fluid restriction -echo stable -lasix/aldactone    History of aortic valve replacement/dyslipidemia/hypertension -Continue metoprolol, aspirin, pravastatin -Not currently on anticoagulation   Paroxysmal atrial fibrillation-currently sinus rhythm -Appears not to be on anticoagulation -Continue metoprolol for heart rate control and monitor on telemetry   CKD stage IIIb -Baseline creatinine 2.1-2.3 -Continue close monitoring on diuresis     Posterior right upper lobe lung mass -Concerning for possible neoplasm -Plan for 41-month CT/PET CT follow-up -Prior history of tobacco abuse, not currently   History of type 2 diabetes -resume home meds   History of COPD with chronic hypoxemia -History of long-haul COVID -Normally wears 2-3 liters nasal cannula at home -No acute bronchospasms noted, continue to monitor   GERD -PPI   Thrombocytopenia -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   Hypomagnesemia -repleted    Medical Consultants:      Discharge Exam:   Vitals:   02/14/21 0449 02/14/21 0820  BP: 130/74   Pulse: (!) 59   Resp: 20   Temp: 97.6 F (36.4 C)   SpO2: 98% 98%   Vitals:   02/13/21 1918 02/13/21 1944 02/14/21 0449 02/14/21 0820  BP: 116/64  130/74   Pulse: (!) 58  (!) 59   Resp: 20  20   Temp: (!) 97.4 F (36.3 C)  97.6 F (36.4 C)   TempSrc: Oral  Oral   SpO2: 98% 90% 98% 98%  Weight:   73.1 kg   Height:        General exam:  Appears calm and comfortable. The results of significant diagnostics from this hospitalization (including imaging, microbiology, ancillary and laboratory) are listed below for reference.     Procedures and Diagnostic Studies:   CT Abdomen Pelvis Wo Contrast  Result Date: 02/11/2021 CLINICAL DATA:  Chest pain, shortness of breath and abdominal swelling. EXAM: CT CHEST, ABDOMEN AND PELVIS WITHOUT CONTRAST TECHNIQUE: Multidetector CT imaging of the chest, abdomen and pelvis was performed following the standard protocol without IV contrast. COMPARISON:  A CTA chest study 11/07/2009 FINDINGS: CT CHEST FINDINGS Cardiovascular: The heart is moderately enlarged. No pericardial effusion is seen. 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 There are scattered aortic calcific plaques. The pulmonary trunk is upper-normal caliber. Mediastinum/Nodes: Enlarged lower right paratracheal lymph node today measures 1.5 x 1.5 cm, previously 1.5 x 1.3 cm. There is a slightly prominent lymph node just above this which is unchanged. No other adenopathy seen without contrast. Lungs/Pleura: There is extensive coarse chronic interstitial change, with cystic honeycombing predominating in the upper lobes but involving all pulmonary segments. This has progressed since 2011. Some of the subpleural interstitial thickening in the lower zones could be due to interstitial edema. Is difficult to assess for subtle infiltrates in the study due to respiratory motion. There is a focal posterior right upper lobe pleural based opacity measuring 1.6 x 1.5 cm which could be a pulmonary mass, focal scarring or infiltrate and was not present in 2011. There are small right greater than left layering pleural effusions.  Musculoskeletal: There are degenerative changes of the thoracic spine. CT ABDOMEN PELVIS FINDINGS: Images are grainy due to patient body habitus, ascites and technique. Hepatobiliary: Liver is 14.3 cm length. Some images may suggest nodular surface changes of cirrhosis, not seen in 2011. No focal lesion is seen without contrast. There is a 2.4 cm stone in the gallbladder. No biliary dilatation or gallbladder thickening is seen. Pancreas: No visible focal abnormality without contrast. Spleen: Enlarged, measuring 15.9 cm in length, otherwise unremarkable without contrast. Adrenals/Urinary Tract: There is no adrenal mass, no focal abnormality of unenhanced renal cortex but there is bilateral cortical thinning noted. There are no stones or hydronephrosis. Stomach/Bowel: There is a moderately thickened stomach, diffusely. Small hiatal hernia with thickened distal thoracic esophagus. No small bowel obstruction or inflammation is seen. There are mild mesenteric congestive features and mild body wall anasarca in the lower abdomen. There are scattered colonic diverticula without evidence of diverticulitis. Vascular/Lymphatic: Aortic atherosclerosis. No enlarged abdominal or pelvic lymph nodes. The hepatic portal vein is prominent measuring 15.2 mm AP. There is at least partial recanalization of the umbilical vein. There is an engorged splenic vein. Reproductive: Mildly enlarged prostate gland. Right testicle appears to be retracted into the inguinal canal. Other: There is moderate free ascites in the abdomen and pelvis. Musculoskeletal: There is osteopenia with degenerative changes in the lumbar spine and multilevel endplate Schmorl's nodes. IMPRESSION: 1. Cardiomegaly, with aortic ectasia and  aortic valve replacement. 2. Pulmonary fibrosis with honeycombing, upper lobe predominance. Breathing motion limits assessment for subtle infiltrates. Some of the subpleural interstitial thickening in the bases could be due to  interstitial edema is there are small right greater than left pleural effusions. 3. 1.6 x 1.5 cm pleural based opacity in the posterior right upper lobe which could be an inflammatory nodule, nodular atelectasis or scarring or neoplasm. PET-CT or three-month follow-up CT recommended. 4. New significant change in mildly prominent mediastinal lymph nodes. 5. Enlarged spleen, prominent portal vein and some images suggesting cirrhosis of the liver, with moderate free ascites. 6. Prominent distal esophageal and gastric wall, which could be due to portal hypertension or esophagogastritis. 7. Prostatomegaly with probable retraction of the right testicle into the right inguinal canal. 8. Mesenteric congestive features and mild lower abdominal body wall anasarca. 9. Cholelithiasis and remaining findings discussed above Electronically Signed   By: Telford Nab M.D.   On: 02/11/2021 05:37   DG Chest 2 View  Result Date: 02/10/2021 CLINICAL DATA:  Acute shortness of breath for 3 days. EXAM: CHEST - 2 VIEW COMPARISON:  10/23/2020 FINDINGS: Cardiomegaly and cardiac valve replacement changes noted. Diffuse bilateral interstitial opacities are unchanged. No pleural effusion or pneumothorax identified. No acute bony abnormalities are noted. IMPRESSION: Cardiomegaly with chronic diffuse bilateral interstitial opacities. Electronically Signed   By: Margarette Canada M.D.   On: 02/10/2021 17:42   CT Chest Wo Contrast  Result Date: 02/11/2021 CLINICAL DATA:  Chest pain, shortness of breath and abdominal swelling. EXAM: CT CHEST, ABDOMEN AND PELVIS WITHOUT CONTRAST TECHNIQUE: Multidetector CT imaging of the chest, abdomen and pelvis was performed following the standard protocol without IV contrast. COMPARISON:  A CTA chest study 11/07/2009 FINDINGS: CT CHEST FINDINGS Cardiovascular: The heart is moderately enlarged. No pericardial effusion is seen. There are sternotomy sutures, prosthetic aortic valve which has been seen on more  recent chest films, apparent sleeve repair of the ascending aorta with thoracic aortic ectasia with the ascending segment 4.1 cm, the arch 3.4 cm and the descending segment up to 3.7 cm. There are scattered aortic calcific plaques. The pulmonary trunk is upper-normal caliber. Mediastinum/Nodes: Enlarged lower right paratracheal lymph node today measures 1.5 x 1.5 cm, previously 1.5 x 1.3 cm. There is a slightly prominent lymph node just above this which is unchanged. No other adenopathy seen without contrast. Lungs/Pleura: There is extensive coarse chronic interstitial change, with cystic honeycombing predominating in the upper lobes but involving all pulmonary segments. This has progressed since 2011. Some of the subpleural interstitial thickening in the lower zones could be due to interstitial edema. Is difficult to assess for subtle infiltrates in the study due to respiratory motion. There is a focal posterior right upper lobe pleural based opacity measuring 1.6 x 1.5 cm which could be a pulmonary mass, focal scarring or infiltrate and was not present in 2011. There are small right greater than left layering pleural effusions. Musculoskeletal: There are degenerative changes of the thoracic spine. CT ABDOMEN PELVIS FINDINGS: Images are grainy due to patient body habitus, ascites and technique. Hepatobiliary: Liver is 14.3 cm length. Some images may suggest nodular surface changes of cirrhosis, not seen in 2011. No focal lesion is seen without contrast. There is a 2.4 cm stone in the gallbladder. No biliary dilatation or gallbladder thickening is seen. Pancreas: No visible focal abnormality without contrast. Spleen: Enlarged, measuring 15.9 cm in length, otherwise unremarkable without contrast. Adrenals/Urinary Tract: There is no adrenal mass, no focal abnormality  of unenhanced renal cortex but there is bilateral cortical thinning noted. There are no stones or hydronephrosis. Stomach/Bowel: There is a moderately  thickened stomach, diffusely. Small hiatal hernia with thickened distal thoracic esophagus. No small bowel obstruction or inflammation is seen. There are mild mesenteric congestive features and mild body wall anasarca in the lower abdomen. There are scattered colonic diverticula without evidence of diverticulitis. Vascular/Lymphatic: Aortic atherosclerosis. No enlarged abdominal or pelvic lymph nodes. The hepatic portal vein is prominent measuring 15.2 mm AP. There is at least partial recanalization of the umbilical vein. There is an engorged splenic vein. Reproductive: Mildly enlarged prostate gland. Right testicle appears to be retracted into the inguinal canal. Other: There is moderate free ascites in the abdomen and pelvis. Musculoskeletal: There is osteopenia with degenerative changes in the lumbar spine and multilevel endplate Schmorl's nodes. IMPRESSION: 1. Cardiomegaly, with aortic ectasia and aortic valve replacement. 2. Pulmonary fibrosis with honeycombing, upper lobe predominance. Breathing motion limits assessment for subtle infiltrates. Some of the subpleural interstitial thickening in the bases could be due to interstitial edema is there are small right greater than left pleural effusions. 3. 1.6 x 1.5 cm pleural based opacity in the posterior right upper lobe which could be an inflammatory nodule, nodular atelectasis or scarring or neoplasm. PET-CT or three-month follow-up CT recommended. 4. New significant change in mildly prominent mediastinal lymph nodes. 5. Enlarged spleen, prominent portal vein and some images suggesting cirrhosis of the liver, with moderate free ascites. 6. Prominent distal esophageal and gastric wall, which could be due to portal hypertension or esophagogastritis. 7. Prostatomegaly with probable retraction of the right testicle into the right inguinal canal. 8. Mesenteric congestive features and mild lower abdominal body wall anasarca. 9. Cholelithiasis and remaining findings  discussed above Electronically Signed   By: Telford Nab M.D.   On: 02/11/2021 05:37   ECHOCARDIOGRAM COMPLETE  Result Date: 02/11/2021    ECHOCARDIOGRAM REPORT   Patient Name:   Ryan Burke Date of Exam: 02/11/2021 Medical Rec #:  [id number]     Height:       61.0 in Accession #:    [id number]    Weight:       163.2 lb Date of Birth:  [birth year]      BSA:          1.732 m Patient Age:    19 years      BP:           130/73 mmHg Patient Gender: M             HR:           54 bpm. Exam Location:  Inpatient Procedure: 2D Echo Indications:     acute diastolic chf  History:         Patient has prior history of Echocardiogram examinations, most                  recent 08/01/2009. Arrythmias:Atrial Fibrillation; Risk                  Factors:Hypertension, Dyslipidemia, Diabetes and Former Smoker.                  Aortic Valve: 23 mm Magna bioprosthetic valve is present in the                  aortic position.  Sonographer:     Johny Chess RDCS Referring Phys:  [id number] Royanne Foots [facility] Diagnosing Phys: Adrian Prows MD  Sonographer Comments: Suboptimal subcostal window. IMPRESSIONS  1. Left ventricular ejection fraction, by estimation, is 55 to 60%. The left ventricle has normal function. The left ventricle has no regional wall motion abnormalities. There is mild concentric left ventricular hypertrophy. Left ventricular diastolic function could not be evaluated.  2. Right ventricular systolic function is normal. The right ventricular size is mildly enlarged. There is normal pulmonary artery systolic pressure.  3. Left atrial size was moderately dilated.  4. The mitral valve is normal in structure. No evidence of mitral valve regurgitation. No evidence of mitral stenosis.  5. The aortic valve is tricuspid. Aortic valve regurgitation is not visualized. No aortic stenosis is present. There is a 23 mm Magna bioprosthetic valve present in the aortic position. Comparison(s): No significant change from prior study. 12/27/2019.  FINDINGS  Left Ventricle: Left ventricular ejection fraction, by estimation, is 55 to 60%. The left ventricle has normal function. The left ventricle has no regional wall motion abnormalities. The left ventricular internal cavity size was normal in size. There is  mild concentric left ventricular hypertrophy. Left ventricular diastolic function could not be evaluated. Right Ventricle: The right ventricular size is mildly enlarged. No increase in right ventricular wall thickness. Right ventricular systolic function is normal. There is normal pulmonary artery systolic pressure. The tricuspid regurgitant velocity is 2.44  m/s, and with an assumed right atrial pressure of 3 mmHg, the estimated right ventricular systolic pressure is 0000000 mmHg. Left Atrium: Left atrial size was moderately dilated. Right Atrium: Right atrial size was normal in size. Pericardium: There is no evidence of pericardial effusion. Mitral Valve: The mitral valve is normal in structure. No evidence of mitral valve regurgitation. No evidence of mitral valve stenosis. Tricuspid Valve: The tricuspid valve is normal in structure. Tricuspid valve regurgitation is mild . No evidence of tricuspid stenosis. Aortic Valve: The aortic valve is tricuspid. Aortic valve regurgitation is not visualized. No aortic stenosis is present. Aortic valve mean gradient measures 11.0 mmHg. Aortic valve peak gradient measures 19.4 mmHg. Aortic valve area, by VTI measures 1.95 cm. There is a 23 mm Magna bioprosthetic valve present in the aortic position. Pulmonic Valve: The pulmonic valve was normal in structure. Pulmonic valve regurgitation is not visualized. No evidence of pulmonic stenosis. Aorta: The aortic root is normal in size and structure. IAS/Shunts: No atrial level shunt detected by color flow Doppler.  LEFT VENTRICLE PLAX 2D LVIDd:         4.80 cm   Diastology LVIDs:         3.40 cm   LV e' medial:    5.66 cm/s LV PW:         1.10 cm   LV E/e' medial:  14.6 LV  IVS:        1.20 cm   LV e' lateral:   10.10 cm/s LVOT diam:     2.10 cm   LV E/e' lateral: 8.2 LV SV:         91 LV SV Index:   52 LVOT Area:     3.46 cm  RIGHT VENTRICLE RV S prime:     14.40 cm/s TAPSE (M-mode): 1.1 cm LEFT ATRIUM             Index LA diam:        4.30 cm 2.48 cm/m LA Vol (A2C):   54.6 ml 31.52 ml/m LA Vol (A4C):   75.7 ml 43.70 ml/m LA Biplane Vol: 66.1 ml 38.16 ml/m  AORTIC VALVE AV  Area (Vmax):    1.54 cm AV Area (Vmean):   1.43 cm AV Area (VTI):     1.95 cm AV Vmax:           220.00 cm/s AV Vmean:          149.000 cm/s AV VTI:            0.467 m AV Peak Grad:      19.4 mmHg AV Mean Grad:      11.0 mmHg LVOT Vmax:         97.65 cm/s LVOT Vmean:        61.600 cm/s LVOT VTI:          0.262 m LVOT/AV VTI ratio: 0.56  AORTA Ao Asc diam: 3.50 cm MITRAL VALVE               TRICUSPID VALVE MV Area (PHT): 2.62 cm    TR Peak grad:   23.8 mmHg MV Decel Time: 290 msec    TR Vmax:        244.00 cm/s MV E velocity: 82.50 cm/s MV A velocity: 79.20 cm/s  SHUNTS MV E/A ratio:  1.04        Systemic VTI:  0.26 m                            Systemic Diam: 2.10 cm Adrian Prows MD Electronically signed by Adrian Prows MD Signature Date/Time: 02/11/2021/10:15:37 PM    Final    US LIVER DOPPLER  Result Date: 02/11/2021 CLINICAL DATA:  80 year old male with history of cirrhosis. EXAM: DUPLEX ULTRASOUND OF LIVER TECHNIQUE: Color and duplex Doppler ultrasound was performed to evaluate the hepatic in-flow and out-flow vessels. COMPARISON:  CT abdomen pelvis from earlier the same day, abdominal ultrasound from 04/19/2013. FINDINGS: Liver: Normal echogenicity. Coarsened echotexture. Nodular contour. The right lobe appears shrunken. No focal lesion, mass or intrahepatic biliary ductal dilatation. Gallbladder: Decompressed with mild diffuse wall thickening. There is a shadowing gallstone in the infundibulum measuring up to 1.1 cm. Main Portal Vein size: 1.3 cm Portal Vein Velocities (all antegrade) Main Prox:  30  cm/sec Main Mid: 25 cm/sec Main Dist:  22 cm/sec Right: 18 cm/sec Left: 20 cm/sec Hepatic Vein Velocities (all antegrade) Right:  16 cm/sec Middle:  33 cm/sec Left:  38 cm/sec IVC: Present and patent with normal respiratory phasicity. Hepatic Artery Velocity: Not visualized. Splenic Vein Velocity:  24 cm/sec Spleen: 16.3 cm x 15 cm x 10.5 cm with a total volume of 1,340 cm^3 (411 cm^3 is upper limit normal) Portal Vein Occlusion/Thrombus: No Splenic Vein Occlusion/Thrombus: No Ascites: Moderate volume perihepatic ascites is visualized. Varices: None IMPRESSION: 1. Morphologic changes of hepatic cirrhosis and secondary signs of portal hypertension including splenomegaly and moderate ascites. The portal and hepatic veins are patent with normal directions of flow. 2. Cholelithiasis without sonographic evidence of cholecystitis. Ruthann Cancer, MD [facility] Electronically Signed   By: Ruthann Cancer M.D.   On: 02/11/2021 13:22     Labs:   Basic Metabolic Panel: Recent Labs  Lab 02/10/21 1715 02/12/21 0239 02/13/21 0223 02/14/21 0150  NA 140 141 138 139  K 4.3 3.7 3.4* 3.9  CL 115* 112* 109 110  CO2 21* 22 23 24   GLUCOSE 133* 90 98 135*  BUN 11 16 16 21   CREATININE 2.21* 2.31* 2.52* 2.62*  CALCIUM 7.7* 7.7* 7.7* 8.0*  MG  --  1.2* 1.4*  --    GFR Estimated Creatinine Clearance: 19.6 mL/min (A) (by C-G formula based on SCr of 2.62 mg/dL (H)). Liver Function Tests: Recent Labs  Lab 02/10/21 1715  AST 42*  ALT 25  ALKPHOS 182*  BILITOT 0.9  PROT 5.2*  ALBUMIN 2.4*   No results for input(s): LIPASE, AMYLASE in the last 168 hours. Recent Labs  Lab 02/14/21 0150  AMMONIA 63*   Coagulation profile No results for input(s): INR, PROTIME in the last 168 hours.  CBC: Recent Labs  Lab 02/10/21 1715 02/12/21 0239 02/13/21 0223 02/14/21 0150  WBC 3.2* 2.9* 3.3* 2.9*  NEUTROABS 1.7  --   --   --   HGB 11.3* 10.6* 10.6* 10.7*   HCT 35.3* 31.8* 32.4* 32.4*  MCV 91.0 88.3 88.5 88.0  PLT 62* 55* 49* 50*   Cardiac Enzymes: No results for input(s): CKTOTAL, CKMB, CKMBINDEX, TROPONINI in the last 168 hours. BNP: Invalid input(s): POCBNP CBG: Recent Labs  Lab 02/13/21 1116 02/13/21 1555 02/13/21 2101 02/14/21 0525 02/14/21 1146  GLUCAP 128* 148* 99 106* 115*   D-Dimer No results for input(s): DDIMER in the last 72 hours. Hgb A1c Recent Labs    02/12/21 0239  HGBA1C 5.7*   Lipid Profile No results for input(s): CHOL, HDL, LDLCALC, TRIG, CHOLHDL, LDLDIRECT in the last 72 hours. Thyroid function studies No results for input(s): TSH, T4TOTAL, T3FREE, THYROIDAB in the last 72 hours.  Invalid input(s): FREET3 Anemia work up No results for input(s): VITAMINB12, FOLATE, FERRITIN, TIBC, IRON, RETICCTPCT in the last 72 hours. Microbiology Recent Results (from the past 240 hour(s))  Resp Panel by RT-PCR (Flu A&B, Covid) Nasopharyngeal Swab     Status: None   Collection Time: 02/11/21  4:08 AM   Specimen: Nasopharyngeal Swab; Nasopharyngeal(NP) swabs in vial transport medium  Result Value Ref Range Status   SARS Coronavirus 2 by RT PCR NEGATIVE NEGATIVE Final    Comment: (NOTE) SARS-CoV-2 target nucleic acids are NOT DETECTED.  The SARS-CoV-2 RNA is generally detectable in upper respiratory specimens during the acute phase of infection. The lowest concentration of SARS-CoV-2 viral copies this assay can detect is 138 copies/mL. A negative result does not preclude SARS-Cov-2 infection and should not be used as the sole basis for treatment or other patient management decisions. A negative result may occur with  improper specimen collection/handling, submission of specimen other than nasopharyngeal swab, presence of viral mutation(s) within the areas targeted by this assay, and inadequate number of viral copies(<138 copies/mL). A negative result must be combined with clinical observations, patient history,  and epidemiological information. The expected result is Negative.  Fact Sheet for Patients:  EntrepreneurPulse.com.au  Fact Sheet for Healthcare Providers:  IncredibleEmployment.be  This test is no t yet approved or cleared by the [place] FDA and  has been authorized for detection and/or diagnosis of SARS-CoV-2 by FDA under an Emergency Use Authorization (EUA). This EUA will remain  in effect (meaning this test can be used) for the duration of the COVID-19 declaration under Section 564(b)(1) of the Act, 21 U.S.C.section 360bbb-3(b)(1), unless the authorization is terminated  or revoked sooner.       Influenza A by PCR NEGATIVE NEGATIVE Final   Influenza B by PCR NEGATIVE NEGATIVE Final    Comment: (NOTE) The Xpert Xpress SARS-CoV-2/FLU/RSV plus assay is intended as an aid in the diagnosis of influenza from Nasopharyngeal swab specimens and should not be used as a sole basis for treatment. Nasal washings and  aspirates are unacceptable for Xpert Xpress SARS-CoV-2/FLU/RSV testing.  Fact Sheet for Patients: EntrepreneurPulse.com.au  Fact Sheet for Healthcare Providers: IncredibleEmployment.be  This test is not yet approved or cleared by the [place] FDA and has been authorized for detection and/or diagnosis of SARS-CoV-2 by FDA under an Emergency Use Authorization (EUA). This EUA will remain in effect (meaning this test can be used) for the duration of the COVID-19 declaration under Section 564(b)(1) of the Act, 21 U.S.C. section 360bbb-3(b)(1), unless the authorization is terminated or revoked.  Performed at [facility], [place] [address], [place], [place] [address]   Culture, body fluid w Gram Stain-bottle     Status: None (Preliminary result)   Collection Time: 02/12/21  1:43 PM   Specimen: Peritoneal Washings  Result Value Ref Range Status   Specimen Description PERITONEAL FLUID   Final   Special Requests NONE  Final   Culture   Final    NO GROWTH 1 DAY Performed at [facility], [place] [address] [place], [place] [address]    Report Status PENDING  Incomplete  Gram stain     Status: None   Collection Time: 02/12/21  1:43 PM   Specimen: Peritoneal Washings  Result Value Ref Range Status   Specimen Description PERITONEAL FLUID  Final   Special Requests NONE  Final   Gram Stain   Final    WBC PRESENT,BOTH PMN AND MONONUCLEAR NO ORGANISMS SEEN CYTOSPIN SMEAR Performed at [facility], [address] [place], [place] [address]    Report Status 02/12/2021 FINAL  Final     Discharge Instructions:   Discharge Instructions     (HEART FAILURE PATIENTS) Call MD:  Anytime you have any of the following symptoms: 1) 3 pound weight gain in 24 hours or 5 pounds in 1 week 2) shortness of breath, with or without a dry hacking cough 3) swelling in the hands, feet or stomach 4) if you have to sleep on extra pillows at night in order to breathe.   Complete by: As directed    Diet - low sodium heart healthy   Complete by: As directed    Diet Carb Modified   Complete by: As directed    Discharge instructions   Complete by: As directed    BMP 1 week re; kidney function- will needs adjustments of diuretics/BS meds   Increase activity slowly   Complete by: As directed       Allergies as of 02/14/2021   No Known Allergies      Medication List     STOP taking these medications    cefdinir 300 MG capsule Commonly known as: OMNICEF   doxycycline 100 MG capsule Commonly known as: VIBRAMYCIN   oxyCODONE 5 MG immediate release tablet Commonly known as: Roxicodone   sodium bicarbonate 650 MG tablet       TAKE these medications    acetaminophen 500 MG tablet Commonly known as: TYLENOL Take 500 mg by mouth every 6 (six) hours as needed for moderate pain or headache.   albuterol 108 (90 Base) MCG/ACT inhaler Commonly known as: VENTOLIN  HFA Inhale 2 puffs into the lungs every 6 (six) hours as needed for wheezing or shortness of breath.   aspirin EC 81 MG tablet Take 81 mg by mouth daily.   budesonide-formoterol 160-4.5 MCG/ACT inhaler Commonly known as: Symbicort Inhale 2 puffs into the lungs in the morning and at bedtime.   cetirizine 10 MG tablet Commonly known  as: ZYRTEC Take 10 mg by mouth daily as needed for allergies.   colchicine 0.6 MG tablet Take 0.6 mg by mouth 2 (two) times daily as needed (gout).   doxazosin 4 MG tablet Commonly known as: CARDURA Take 4 mg by mouth daily.   Farxiga 5 MG Tabs tablet Generic drug: dapagliflozin propanediol Take 5 mg by mouth daily.   furosemide 20 MG tablet Commonly known as: LASIX Take 1 tablet (20 mg total) by mouth daily. Start taking on: February 15, 2021   Januvia 50 MG tablet Generic drug: sitaGLIPtin Take 0.5 tablets (25 mg total) by mouth daily. What changed: how much to take   lactulose 10 GM/15ML solution Commonly known as: CHRONULAC Take 15 mLs (10 g total) by mouth daily. Will need to titrate to 2 BMs/day Start taking on: February 15, 2021   metoprolol tartrate 25 MG tablet Commonly known as: LOPRESSOR Take 1 tablet (25 mg total) by mouth 2 (two) times daily.   nitroGLYCERIN 0.4 MG SL tablet Commonly known as: NITROSTAT Place 0.4 mg under the tongue every 5 (five) minutes as needed for chest pain.   omeprazole 20 MG capsule Commonly known as: PRILOSEC Take 20 mg by mouth 2 (two) times daily.   pravastatin 20 MG tablet Commonly known as: PRAVACHOL Take 20 mg by mouth every evening.   spironolactone 50 MG tablet Commonly known as: ALDACTONE Take 1 tablet (50 mg total) by mouth daily. Start taking on: February 15, 2021          Time coordinating discharge: 35 min  Signed:  Geradine Girt DO  [facility] 02/14/2021, 11:48 AM

## 2021-02-17 LAB — CULTURE, BODY FLUID W GRAM STAIN -BOTTLE: Culture: NO GROWTH

## 2021-02-24 ENCOUNTER — Other Ambulatory Visit: Payer: Self-pay | Admitting: Internal Medicine

## 2021-02-25 LAB — CBC
HCT: 35.3 % — ABNORMAL LOW (ref 38.5–50.0)
Hemoglobin: 11.5 g/dL — ABNORMAL LOW (ref 13.2–17.1)
MCH: 28.7 pg (ref 27.0–33.0)
MCHC: 32.6 g/dL (ref 32.0–36.0)
MCV: 88 fL (ref 80.0–100.0)
MPV: 10.3 fL (ref 7.5–12.5)
Platelets: 96 10*3/uL — ABNORMAL LOW (ref 140–400)
RBC: 4.01 10*6/uL — ABNORMAL LOW (ref 4.20–5.80)
RDW: 14.3 % (ref 11.0–15.0)
WBC: 6.4 10*3/uL (ref 3.8–10.8)

## 2021-02-25 LAB — BASIC METABOLIC PANEL WITH GFR
BUN/Creatinine Ratio: 11 (calc) (ref 6–22)
BUN: 28 mg/dL — ABNORMAL HIGH (ref 7–25)
CO2: 21 mmol/L (ref 20–32)
Calcium: 7.6 mg/dL — ABNORMAL LOW (ref 8.6–10.3)
Chloride: 105 mmol/L (ref 98–110)
Creat: 2.57 mg/dL — ABNORMAL HIGH (ref 0.70–1.28)
Glucose, Bld: 108 mg/dL — ABNORMAL HIGH (ref 65–99)
Potassium: 4.2 mmol/L (ref 3.5–5.3)
Sodium: 134 mmol/L — ABNORMAL LOW (ref 135–146)
eGFR: 25 mL/min/{1.73_m2} — ABNORMAL LOW (ref >=60)

## 2021-03-03 ENCOUNTER — Encounter: Payer: Self-pay | Admitting: Physician Assistant

## 2021-03-17 ENCOUNTER — Ambulatory Visit (INDEPENDENT_AMBULATORY_CARE_PROVIDER_SITE_OTHER): Payer: Medicare Other | Admitting: Physician Assistant

## 2021-03-17 ENCOUNTER — Encounter: Payer: Self-pay | Admitting: Physician Assistant

## 2021-03-17 ENCOUNTER — Other Ambulatory Visit (INDEPENDENT_AMBULATORY_CARE_PROVIDER_SITE_OTHER): Payer: Medicare Other

## 2021-03-17 VITALS — BP 108/62 | HR 80 | Ht 61.0 in | Wt 146.2 lb

## 2021-03-17 DIAGNOSIS — R188 Other ascites: Secondary | ICD-10-CM

## 2021-03-17 DIAGNOSIS — K7460 Unspecified cirrhosis of liver: Secondary | ICD-10-CM

## 2021-03-17 LAB — CBC WITH DIFFERENTIAL/PLATELET
Basophils Absolute: 0 10*3/uL (ref 0.0–0.1)
Basophils Relative: 0.8 % (ref 0.0–3.0)
Eosinophils Absolute: 0.2 10*3/uL (ref 0.0–0.7)
Eosinophils Relative: 4.8 % (ref 0.0–5.0)
HCT: 33.5 % — ABNORMAL LOW (ref 39.0–52.0)
Hemoglobin: 11.4 g/dL — ABNORMAL LOW (ref 13.0–17.0)
Lymphocytes Relative: 21.4 % (ref 12.0–46.0)
Lymphs Abs: 1 10*3/uL (ref 0.7–4.0)
MCHC: 33.9 g/dL (ref 30.0–36.0)
MCV: 85.6 fl (ref 78.0–100.0)
Monocytes Absolute: 0.4 10*3/uL (ref 0.1–1.0)
Monocytes Relative: 7.9 % (ref 3.0–12.0)
Neutro Abs: 3.1 10*3/uL (ref 1.4–7.7)
Neutrophils Relative %: 65.1 % (ref 43.0–77.0)
Platelets: 84 10*3/uL — ABNORMAL LOW (ref 150.0–400.0)
RBC: 3.92 Mil/uL — ABNORMAL LOW (ref 4.22–5.81)
RDW: 14.9 % (ref 11.5–15.5)
WBC: 4.7 10*3/uL (ref 4.0–10.5)

## 2021-03-17 LAB — FERRITIN: Ferritin: 195.1 ng/mL (ref 22.0–322.0)

## 2021-03-17 LAB — PROTIME-INR
INR: 1.5 ratio — ABNORMAL HIGH (ref 0.8–1.0)
Prothrombin Time: 15.7 s — ABNORMAL HIGH (ref 9.6–13.1)

## 2021-03-17 MED ORDER — SPIRONOLACTONE 50 MG PO TABS: 50.0000 mg | ORAL_TABLET | Freq: Every day | ORAL | 3 refills | Status: AC

## 2021-03-17 MED ORDER — FUROSEMIDE 20 MG PO TABS: 20.0000 mg | ORAL_TABLET | Freq: Every day | ORAL | 3 refills | Status: DC

## 2021-03-17 MED ORDER — LACTULOSE 10 GM/15ML PO SOLN
10.0000 g | Freq: Every day | ORAL | 3 refills | Status: AC
Start: 2021-03-17 — End: 2021-04-16

## 2021-03-17 NOTE — Progress Notes (Signed)
Subjective:    Patient ID: Ryan Burke, male    DOB: [birth year], 80 y.o.   MRN: [id number]  HPI Ryan Burke is a 80 year old [place] male, non-English speaking, new to GI today referred by [facility]/Jessica Lucianne Lei DO after recent hospitalization.  He has a new diagnosis of cirrhosis.   His PCP is Dr. Jeanie Cooks. Patient was hospitalized 11/15 through 02/14/2021 with acute on chronic respiratory failure.  He has history of COPD, pulmonary fibrosis with chronic hypoxemia requiring chronic oxygen over the past 3 years.  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  He had presented to the emergency room with worsening shortness of breath and worsening edema. Work-up during hospitalization with CT of the chest abdomen and pelvis showed cardiomegaly, aortic ectasia, aortic valve replacement, pulmonary fibrosis with honeycombing, there is a 1.6 x 1.5 cm pleural-based opacity in the posterior right upper lobe 46-monthfollow-up PET/CT recommended.  Enlarged spleen, prominent portal vein, and some images suggesting cirrhosis of the liver with moderate ascites.  Prominent distal esophageal and gastric wall which could be due to portal hypertension and esophagogastritis.,  Prostatomegaly and cholelithiasis  Ultrasound of the liver with Doppler 02/11/2021 showed coarsened echotexture and nodular contour of the liver decompressed gallbladder with mild diffuse wall thickening shadowing gallstone measuring up to 1.1 cm, splenomegaly and moderate ascites portal and hepatic veins patent with normal directions of flow. He underwent IR paracentesis 02/12/2021 with removal of 3.1 L. Cell counts did not show any evidence of SBP.  Fluid albumin less than 1.5. Cytology-no malignant cells present, cultures negative  BNP elevated at 221 on admission 2D echo showed EF of 55 to 60%, bioprosthetic valve  LFTs with T  bili 0.9/alk phos 182/AST 42/ALT 25/albumin 2.4 Creatinine 2.21 No INR done WBC 3.2/hemoglobin 11.3/platelets 62  Patient has been started on diuretics with Aldactone 50 mg daily and Lasix 20 mg daily currently.  He was also discharged on 15 cc of lactulose daily. Most of the history comes from the patient's daughter-in-law via the interpreter.  They feel that he has been doing well/stable since discharge, appetite has been okay.  No complaints of abdominal pain.  Abdominal girth has decreased and peripheral edema has decreased as well.  He has been taking medications as directed.  Patient was unaware of any diagnosis of cirrhosis prior to this admission.  [place]  No family history of liver disease that they are aware of, no history of hepatitis/yellow jaundice that they are aware of.  He used to drink alcohol on a regular basis when living in [place]  It sounds as if he had daily use of alcohol has not been drinking much since [place] perhaps occasionally on the weekends.   Review of Systems Pertinent positive and negative review of systems were noted in the above HPI section.  All other review of systems was otherwise negative.   Outpatient Encounter Medications as of 03/17/2021  Medication Sig   acetaminophen (TYLENOL) 500 MG tablet Take 500 mg by mouth every 6 (six) hours as needed for moderate pain or headache.   albuterol (VENTOLIN HFA) 108 (90 Base) MCG/ACT inhaler Inhale 2 puffs into the lungs every 6 (six) hours as needed for wheezing or shortness of breath.   aspirin EC 81 MG tablet Take 81 mg by mouth daily.   benzonatate (TESSALON) 100 MG capsule Take 100-200  mg by mouth every 8 (eight) hours as needed.   budesonide-formoterol (SYMBICORT) 160-4.5 MCG/ACT inhaler Inhale 2 puffs into the lungs in the morning and at bedtime.   cetirizine (ZYRTEC) 10 MG tablet Take 10 mg by mouth daily as needed for allergies.   colchicine 0.6 MG  tablet Take 0.6 mg by mouth 2 (two) times daily as needed (gout).   doxazosin (CARDURA) 4 MG tablet Take 4 mg by mouth daily.   FARXIGA 5 MG TABS tablet Take 5 mg by mouth daily.   JANUVIA 50 MG tablet Take 0.5 tablets (25 mg total) by mouth daily.   omeprazole (PRILOSEC) 20 MG capsule Take 20 mg by mouth 2 (two) times daily.    pravastatin (PRAVACHOL) 20 MG tablet Take 20 mg by mouth every evening.   [DISCONTINUED] furosemide (LASIX) 20 MG tablet Take 1 tablet (20 mg total) by mouth daily.   [DISCONTINUED] lactulose (CHRONULAC) 10 GM/15ML solution Take 15 mLs (10 g total) by mouth daily. Will need to titrate to 2 BMs/day   [DISCONTINUED] spironolactone (ALDACTONE) 50 MG tablet Take 1 tablet (50 mg total) by mouth daily.   furosemide (LASIX) 20 MG tablet Take 1 tablet (20 mg total) by mouth daily.   lactulose (CHRONULAC) 10 GM/15ML solution Take 15 mLs (10 g total) by mouth daily. Will need to titrate to 2 BMs/day   nitroGLYCERIN (NITROSTAT) 0.4 MG SL tablet Place 0.4 mg under the tongue every 5 (five) minutes as needed for chest pain. (Patient not taking: Reported on 03/17/2021)   spironolactone (ALDACTONE) 50 MG tablet Take 1 tablet (50 mg total) by mouth daily.   [DISCONTINUED] metoprolol tartrate (LOPRESSOR) 25 MG tablet Take 1 tablet (25 mg total) by mouth 2 (two) times daily.   No facility-administered encounter medications on file as of 03/17/2021.   No Known Allergies Patient Active Problem List   Diagnosis Date Noted   Other cirrhosis of liver ([place]) 02/12/2021   Cirrhosis ([place]) 02/12/2021   Acute CHF (congestive heart failure) ([place]) 02/11/2021   Chronic hypoxemic respiratory failure ([place]) 12/26/2020   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   Exertional dyspnea 12/13/2019   Acute respiratory disease due to COVID-19 virus 02/06/2019   Acute bronchitis 08/27/2016   Seizures ([place]) 04/19/2013   Gout 04/19/2013   S/P AVR 05/08/2012   Atrial fibrillation ([place])  05/08/2012   Hyperlipidemia    Aortic regurgitation    Mitral regurgitation    ELEVATED PROSTATE SPECIFIC ANTIGEN 09/24/2009   PROTEINURIA 09/20/2009   AORTIC INSUFFICIENCY, MODERATE 08/02/2009   HYPERGLYCEMIA 07/24/2009   LIVER FUNCTION TESTS, ABNORMAL, HX OF 07/24/2009   Essential hypertension, benign 07/22/2009   Social History   Socioeconomic History   Marital status: Married    Spouse name: Not on file   Number of children: 6   Years of education: Not on file   Highest education level: Not on file  Occupational History   Occupation: retired  Tobacco Use   Smoking status: Former    Packs/day: 0.75    Years: 42.00    Pack years: 31.50    Types: Cigarettes    Start date: 1961    Quit date: 03/30/2001    Years since quitting: 19.9   Smokeless tobacco: Former    Types: Nurse, children's Use: Never used  Substance and Sexual Activity   Alcohol use: Not Currently    Alcohol/week: 1.0 standard drink    Types: 1 Cans of beer per week  Drug use: No   Sexual activity: Not on file  Other Topics Concern   Not on file  Social History Narrative   Not on file   Social Determinants of Health   Financial Resource Strain: Not on file  Food Insecurity: Not on file  Transportation Needs: Not on file  Physical Activity: Not on file  Stress: Not on file  Social Connections: Not on file  Intimate Partner Violence: Not on file    Mr. Canion family history includes Heart disease in his sister and son.      Objective:    Vitals:   03/17/21 1500  BP: 108/62  Pulse: 80    Physical Exam Well-developed elderly [place] male i in no acute distress.  Patient is in a wheelchair, with portable oxygen 2 L he is accompanied by his daughter-in-law and an interpreter weight-146 BMI 27.6  HEENT; nontraumatic normocephalic, EOMI, PE R LA, sclera anicteric. Oropharynx; not examined today Neck; supple, no JVD Cardiovascular; regular rate and rhythm with S1-S2, no murmur  rub or gallop Pulmonary; Clear bilaterally Abdomen; soft, nontender, nondistended, no palpable mass or hepatosplenomegaly, bowel sounds are active, no appreciable fluid wave Rectal; not done today Skin; benign exam, no jaundice rash or appreciable lesions Extremities; trace edema bilateral lower extremities Neuro/Psych; alert and oriented  grossly nonfocal mood and affect appropriate, affect flat, no asterixis, patient only minimally involved in conversation       Assessment & Plan:   #53 80 year old non-English-speaking [place] male with new diagnosis of cirrhosis, decompensated with evidence of portal hypertension, ascites, splenomegaly, thrombocytopenia. Recent hospitalization with dyspnea and edema.  He is now on diuretics with significant improvement in abdominal girth and peripheral edema.  He also underwent 3.1 L paracentesis during admission.  SAAG calculation from labs on admission 0.9, not consistent with portal hypertension, however remainder of imaging and labs is consistent with decompensated cirrhosis.  Etiology of the cirrhosis is not known, further work-up in progress No evidence for hepatic mass on imaging Unable to calculate MELD no pro time/INR  #2 community-acquired pneumonia diagnosed with most recent admission as well #3 chronic hypoxic respiratory failure/pulmonary fibrosis on chronic oxygen #4 pleural-based mass 1.6 x 1.5 cm-recommended for PET/CT in 3 months #5 status post aortic valve replacement/bioprosthetic 6.  Coronary artery disease status post CABG 7.  History of atrial fibrillation 8.  Congestive heart failure most recent echo with preserved EF  Plan; 2 g sodium diet.  Low-sodium diet was discussed in detail with patient's daughter-in-law. CBC with differential, c-Met, pro time/INR, AFP, ferritin, ANA, hepatitis B and C serology\ Continue Lasix 20 mg p.o. every morning Continue Aldactone 50 mg p.o. every morning Continue lactulose 15 cc  daily Patient ideally should have screening endoscopy.  He has just been released from the hospital after pneumonia, has chronic hypoxic respiratory failure and is at higher risk for complications with sedation.  We will hold on scheduling for now, and reassess in the coming months. Patient will be established with Dr. Candis Schatz.  We will plan for office follow-up with Dr. Candis Schatz in 4 weeks   Cornelia Walraven Genia Harold PA-C 03/17/2021   Cc: Nolene Ebbs, MD

## 2021-03-17 NOTE — Patient Instructions (Addendum)
If you are age 79 or older, your body mass index should be between 23-30. Your Body mass index is 27.63 kg/m. If this is out of the aforementioned range listed, please consider follow up with your Primary Care Provider. ________________________________________________________  The [facility] providers would like to encourage you to use [facility] to communicate with providers for non-urgent requests or questions.  Due to long hold times on the telephone, sending your provider a message by [facility] may be a faster and more efficient way to get a response.  Please allow 48 business hours for a response.  Please remember that this is for non-urgent requests.  _______________________________________________________  Your provider has requested that you go to the basement level for lab work before leaving today. Press "B" on the elevator. The lab is located at the first door on the left as you exit the elevator.  Continue Lactulose, Furosemide and Spironolactone.  Follow a low sodium/salt diet.  You have been scheduled to follow up with Dr. Tomasa Rand on April 08, 2021 at 3:40 pm.  Thank you for entrusting me with your care and choosing [facility].  Amy Esterwood, PA-C

## 2021-03-18 LAB — HEPATITIS C ANTIBODY
Hepatitis C Ab: NONREACTIVE
SIGNAL TO CUT-OFF: 0.21 (ref <1.00)

## 2021-03-18 LAB — COMPREHENSIVE METABOLIC PANEL
ALT: 22 U/L (ref 0–53)
AST: 34 U/L (ref 0–37)
Albumin: 3 g/dL — ABNORMAL LOW (ref 3.5–5.2)
Alkaline Phosphatase: 157 U/L — ABNORMAL HIGH (ref 39–117)
BUN: 22 mg/dL (ref 6–23)
CO2: 19 mEq/L (ref 19–32)
Calcium: 8 mg/dL — ABNORMAL LOW (ref 8.4–10.5)
Chloride: 104 mEq/L (ref 96–112)
Creatinine, Ser: 2.3 mg/dL — ABNORMAL HIGH (ref 0.40–1.50)
GFR: 26.29 mL/min — ABNORMAL LOW (ref >60.00)
Glucose, Bld: 77 mg/dL (ref 70–99)
Potassium: 5.1 mEq/L (ref 3.5–5.1)
Sodium: 129 mEq/L — ABNORMAL LOW (ref 135–145)
Total Bilirubin: 0.8 mg/dL (ref 0.2–1.2)
Total Protein: 7.4 g/dL (ref 6.0–8.3)

## 2021-03-18 LAB — HEPATITIS B SURFACE ANTIBODY,QUALITATIVE: Hep B S Ab: NONREACTIVE

## 2021-03-18 LAB — ANA: Anti Nuclear Antibody (ANA): NEGATIVE

## 2021-03-18 LAB — AFP TUMOR MARKER: AFP-Tumor Marker: 2.6 ng/mL (ref <6.1)

## 2021-03-18 LAB — HEPATITIS B SURFACE ANTIGEN: Hepatitis B Surface Ag: NONREACTIVE

## 2021-03-19 ENCOUNTER — Other Ambulatory Visit: Payer: Self-pay

## 2021-03-19 DIAGNOSIS — K7460 Unspecified cirrhosis of liver: Secondary | ICD-10-CM

## 2021-03-20 NOTE — Progress Notes (Signed)
Agree with the assessment and plan as outlined by Amy Esterwood, PA-C.  Khiem Gargis E. Eulas Schweitzer, MD  

## 2021-04-08 ENCOUNTER — Encounter: Payer: Self-pay | Admitting: Gastroenterology

## 2021-04-08 ENCOUNTER — Other Ambulatory Visit (INDEPENDENT_AMBULATORY_CARE_PROVIDER_SITE_OTHER): Payer: Medicare Other

## 2021-04-08 ENCOUNTER — Ambulatory Visit (INDEPENDENT_AMBULATORY_CARE_PROVIDER_SITE_OTHER): Payer: Medicare Other | Admitting: Gastroenterology

## 2021-04-08 VITALS — BP 104/70 | HR 70 | Ht 60.0 in | Wt 151.0 lb

## 2021-04-08 DIAGNOSIS — R188 Other ascites: Secondary | ICD-10-CM

## 2021-04-08 DIAGNOSIS — K7460 Unspecified cirrhosis of liver: Secondary | ICD-10-CM

## 2021-04-08 DIAGNOSIS — N1832 Chronic kidney disease, stage 3b: Secondary | ICD-10-CM

## 2021-04-08 DIAGNOSIS — J9611 Chronic respiratory failure with hypoxia: Secondary | ICD-10-CM

## 2021-04-08 LAB — BASIC METABOLIC PANEL
BUN: 19 mg/dL (ref 6–23)
CO2: 21 mEq/L (ref 19–32)
Calcium: 8 mg/dL — ABNORMAL LOW (ref 8.4–10.5)
Chloride: 103 mEq/L (ref 96–112)
Creatinine, Ser: 2.21 mg/dL — ABNORMAL HIGH (ref 0.40–1.50)
GFR: 27.57 mL/min — ABNORMAL LOW (ref >60.00)
Glucose, Bld: 116 mg/dL — ABNORMAL HIGH (ref 70–99)
Potassium: 4.5 mEq/L (ref 3.5–5.1)
Sodium: 127 mEq/L — ABNORMAL LOW (ref 135–145)

## 2021-04-08 NOTE — Progress Notes (Signed)
HPI : Ryan Burke is a very pleasant 81 year old male with oxygen-dependent pulmonary fibrosis, CKD, a-fib, DM, CAD, history of aortic valve replacement with recently diagnosed cirrhosis who presents to our clinic for follow up.  He is accompanied by his son, daughter-in-law and a Forensic scientist.  He was diagnosed with cirrhosis after presenting with abdominal distention, lower extremity edema and severe shortness of breath in November.  He was found to have large volume ascites and was admitted to [facility] hospital.  He underwent a large volume paracentesis and was started on diuretics.  He was also noted to have an elevated ammonia and was started on lactulose.  There was not a clear history of altered mental status/cognitive change. He was seen by our PA, Amy Esterwood in clinic following his hospitalization and an evaluation for chronic liver disease was negative.   Today, he reports that his ascites and LE edema are well controlled with his diuretics.  No jaundice, pruritis.  No changes in cognition, confusion, slowness of thought or speech.  He has been taking lactulose PRN for constipation, but not taking it daily.   His dyspnea is at baseline.  He is mostly sedentary at home, and does very little activity, mostly sleeping or sitting.  He is seated in a wheelchair in the office, but he ambulates at home short distances.  He has been supplemental oxygen since 2019.  His appetite is okay.  No nausea or vomiting.  His weight has been stable since his initial diuresis.  He was seen by Dr. Benson Norway in 2019 for fatty liver.  See his note below:  The patient has fatty liver as he has a background of metabolic syndrome. Currently he is a frail 81 year old male. The recommendation is to diet and exercise. His son does not know his A1C, but I suspect that it is a higher number as he has diabetic neuropathy. The main risk of mortality and morbidity with fatty liver is from CV disease. His risk factors are  modifiable, but I am not certain how much he will be able to modify. One possibility of the abnormal values with the liver is the pravastatin, but review of his blood work from the hospital shows that this is a chronic issue, i.e., fatty liver is the source of his abnormal liver enzymes. He can follow up in one year or call if there are any changes in the meantime.    Past Medical History:  Diagnosis Date   Aortic regurgitation    Asthma    Chest pain at rest    Chronic bronchitis (HCC)    Chronic kidney disease    Cirrhosis ([place])    Diabetes mellitus without complication ([place])    "has had it before; he's ok now; not on RC anymore" (08/27/2016)   GERD (gastroesophageal reflux disease)    Gout    Heart murmur    Hyperlipidemia    Hypertension    Mitral regurgitation    Seizures ([place]) 03/2013 X 1   "maybe because of a high fever"   Shortness of breath    "w/activity" (08/27/2016)     Past Surgical History:  Procedure Laterality Date   ABDOMINAL AORTAGRAM N/A 04/12/2012   Procedure: ABDOMINAL Maxcine Ham;  Surgeon: Laverda Page, MD;  Location: [facility] CATH LAB;  Service: Cardiovascular;  Laterality: N/A;   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   Procedure: AORTIC VALVE REPLACEMENT (AVR);  Surgeon: Gaye Pollack, MD;  Location: [facility]  OR;  Service: Open Heart Surgery;  Laterality: N/A;   CARDIAC CATHETERIZATION     CARDIAC VALVE REPLACEMENT     CATARACT EXTRACTION W/ INTRAOCULAR LENS  IMPLANT, BILATERAL Bilateral    INTRAOPERATIVE TRANSESOPHAGEAL ECHOCARDIOGRAM  05/04/2012   Procedure: INTRAOPERATIVE TRANSESOPHAGEAL ECHOCARDIOGRAM;  Surgeon: Alleen BorneBryan K Bartle, MD;  [facility];  Service: Open Heart Surgery;  Laterality: N/A;   IR PARACENTESIS  02/12/2021   LEFT AND RIGHT HEART CATHETERIZATION WITH CORONARY ANGIOGRAM N/A 04/12/2012   Procedure: LEFT AND RIGHT HEART CATHETERIZATION WITH CORONARY ANGIOGRAM;  Surgeon: Pamella PertJagadeesh R Ganji, MD;  [facility];  Service: Cardiovascular;   Laterality: N/A;   Family History  Problem Relation Age of Onset   Heart disease Sister    Heart disease Son    Social History   Tobacco Use   Smoking status: Former    Packs/day: 0.75    Years: 42.00    Pack years: 31.50    Types: Cigarettes    Start date: 1961    Quit date: 03/30/2001    Years since quitting: 20.0   Smokeless tobacco: Former    Types: Associate ProfessorChew  Vaping Use   Vaping Use: Never used  Substance Use Topics   Alcohol use: Not Currently    Alcohol/week: 1.0 standard drink    Types: 1 Cans of beer per week   Drug use: No   Current Outpatient Medications  Medication Sig Dispense Refill   acetaminophen (TYLENOL) 500 MG tablet Take 500 mg by mouth every 6 (six) hours as needed for moderate pain or headache.     albuterol (VENTOLIN HFA) 108 (90 Base) MCG/ACT inhaler Inhale 2 puffs into the lungs every 6 (six) hours as needed for wheezing or shortness of breath. 8 g 2   aspirin EC 81 MG tablet Take 81 mg by mouth daily.     benzonatate (TESSALON) 100 MG capsule Take 100-200 mg by mouth every 8 (eight) hours as needed.     budesonide-formoterol (SYMBICORT) 160-4.5 MCG/ACT inhaler Inhale 2 puffs into the lungs in the morning and at bedtime. 1 each 6   cetirizine (ZYRTEC) 10 MG tablet Take 10 mg by mouth daily as needed for allergies.     colchicine 0.6 MG tablet Take 0.6 mg by mouth 2 (two) times daily as needed (gout).     doxazosin (CARDURA) 4 MG tablet Take 4 mg by mouth daily.     FARXIGA 5 MG TABS tablet Take 5 mg by mouth daily.     furosemide (LASIX) 20 MG tablet Take 1 tablet (20 mg total) by mouth daily. 30 tablet 3   JANUVIA 50 MG tablet Take 0.5 tablets (25 mg total) by mouth daily.     lactulose (CHRONULAC) 10 GM/15ML solution Take 15 mLs (10 g total) by mouth daily. Will need to titrate to 2 BMs/day 450 mL 3   nitroGLYCERIN (NITROSTAT) 0.4 MG SL tablet Place 0.4 mg under the tongue every 5 (five) minutes as needed for chest pain.     omeprazole (PRILOSEC) 20 MG  capsule Take 20 mg by mouth 2 (two) times daily.      OXYGEN Inhale into the lungs. 2 liters 24/7     pravastatin (PRAVACHOL) 20 MG tablet Take 20 mg by mouth every evening.     spironolactone (ALDACTONE) 50 MG tablet Take 1 tablet (50 mg total) by mouth daily. 30 tablet 3   No current facility-administered medications for this visit.   No Known Allergies  Review of Systems: All systems reviewed and negative except where noted in HPI.    No results found.  Physical Exam: BP 104/70    Pulse 70    Ht 5' (1.524 m)    Wt 151 lb (68.5 kg)    BMI 29.49 kg/m  Constitutional: Pleasant,well-developed, frail, elderly [place] male in no acute distress.  Seated in wheelchair, accompanied by son, daughter-in-law and Forensic scientist.   HEENT: Normocephalic and atraumatic. Conjunctivae are normal. No scleral icterus. Neck supple.  Cardiovascular: Normal rate, regular rhythm.  Pulmonary/chest: Supplemental oxygen via nasal cannula.  Effort normal and breath sounds normal. Bilateral crackles, coarse breath sounds. Abdominal: Soft, nondistended, nontender. Bowel sounds active throughout. There are no masses palpable. No hepatomegaly. Extremities: no edema Neurological: Answered questions appropriately via interpreter.  No asterixis Skin: Skin is warm and dry. No rashes noted. Psychiatric: Normal mood and affect. Behavior is normal.  CBC    Component Value Date/Time   WBC 4.7 03/17/2021 1606   RBC 3.92 (L) 03/17/2021 1606   HGB 11.4 (L) 03/17/2021 1606   HCT 33.5 (L) 03/17/2021 1606   PLT 84.0 (L) 03/17/2021 1606   MCV 85.6 03/17/2021 1606   MCH 28.7 02/24/2021 0000   MCHC 33.9 03/17/2021 1606   RDW 14.9 03/17/2021 1606   LYMPHSABS 1.0 03/17/2021 1606   MONOABS 0.4 03/17/2021 1606   EOSABS 0.2 03/17/2021 1606   BASOSABS 0.0 03/17/2021 1606    CMP     Component Value Date/Time   NA 129 (L) 03/17/2021 1606   K 5.1 03/17/2021 1606   CL 104 03/17/2021 1606   CO2 19 03/17/2021 1606    GLUCOSE 77 03/17/2021 1606   BUN 22 03/17/2021 1606   CREATININE 2.30 (H) 03/17/2021 1606   CREATININE 2.57 (H) 02/24/2021 0000   CALCIUM 8.0 (L) 03/17/2021 1606   PROT 7.4 03/17/2021 1606   ALBUMIN 3.0 (L) 03/17/2021 1606   AST 34 03/17/2021 1606   ALT 22 03/17/2021 1606   ALKPHOS 157 (H) 03/17/2021 1606   BILITOT 0.8 03/17/2021 1606   GFRNONAA 24 (L) 02/14/2021 0150   GFRNONAA 34 (L) 05/10/2020 0000   GFRAA 40 (L) 05/10/2020 0000   CLINICAL DATA:  Elevated liver function tests.   EXAM:  ULTRASOUND ABDOMEN COMPLETE   COMPARISON:  09/24/2009   FINDINGS:  Gallbladder:   No gallstones or wall thickening visualized. No sonographic Murphy  sign noted.   Common bile duct:   Diameter: 6 mm   Liver:   Diffusely increased echogenicity of the hepatic parenchyma,  consistent with hepatic steatosis. No focal mass lesion identified.   IVC:   Not well visualized due to severe hepatic steatosis.   Pancreas:   Not well visualized due to overlying bowel gas and habitus.   Spleen:   Size and appearance within normal limits.   Right Kidney:   Length: 10.7 cm. Echogenicity within normal limits. No mass or  hydronephrosis visualized.   Left Kidney:   Length: 10.6 cm. Echogenicity within normal limits. No mass or  hydronephrosis visualized.   Abdominal aorta:   No aneurysm visualized.   Other findings:   None.   IMPRESSION:  Severe hepatic steatosis, increased compared to previous exam in  2011.   No evidence of gallstones, biliary dilatation, or other sonographic  abnormality.    Electronically Signed    By: Earle Gell M.D.    On: 04/19/2013 13:49   CLINICAL DATA:  Chest pain, shortness of breath and abdominal swelling.  EXAM: CT CHEST, ABDOMEN AND PELVIS WITHOUT CONTRAST   TECHNIQUE: Multidetector CT imaging of the chest, abdomen and pelvis was performed following the standard protocol without IV contrast.   COMPARISON:  A CTA chest study  11/07/2009   FINDINGS: CT CHEST FINDINGS   Cardiovascular: The heart is moderately enlarged. No pericardial effusion is seen. 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 There are scattered aortic calcific plaques. The pulmonary trunk is upper-normal caliber.   Mediastinum/Nodes: Enlarged lower right paratracheal lymph node today measures 1.5 x 1.5 cm, previously 1.5 x 1.3 cm. There is a slightly prominent lymph node just above this which is unchanged. No other adenopathy seen without contrast.   Lungs/Pleura: There is extensive coarse chronic interstitial change, with cystic honeycombing predominating in the upper lobes but involving all pulmonary segments. This has progressed since 2011. Some of the subpleural interstitial thickening in the lower zones could be due to interstitial edema. Is difficult to assess for subtle infiltrates in the study due to respiratory motion.   There is a focal posterior right upper lobe pleural based opacity measuring 1.6 x 1.5 cm which could be a pulmonary mass, focal scarring or infiltrate and was not present in 2011. There are small right greater than left layering pleural effusions.   Musculoskeletal: There are degenerative changes of the thoracic spine.   CT ABDOMEN PELVIS FINDINGS: Images are grainy due to patient body habitus, ascites and technique.   Hepatobiliary: Liver is 14.3 cm length. Some images may suggest nodular surface changes of cirrhosis, not seen in 2011. No focal lesion is seen without contrast. There is a 2.4 cm stone in the gallbladder. No biliary dilatation or gallbladder thickening is seen.   Pancreas: No visible focal abnormality without contrast.   Spleen: Enlarged, measuring 15.9 cm in  length, otherwise unremarkable without contrast.   Adrenals/Urinary Tract: There is no adrenal mass, no focal abnormality of unenhanced renal cortex but there is bilateral cortical thinning noted. There are no stones or hydronephrosis.   Stomach/Bowel: There is a moderately thickened stomach, diffusely. Small hiatal hernia with thickened distal thoracic esophagus. No small bowel obstruction or inflammation is seen. There are mild mesenteric congestive features and mild body wall anasarca in the lower abdomen. There are scattered colonic diverticula without evidence of diverticulitis.   Vascular/Lymphatic: Aortic atherosclerosis. No enlarged abdominal or pelvic lymph nodes. The hepatic portal vein is prominent measuring 15.2 mm AP. There is at least partial recanalization of the umbilical vein. There is an engorged splenic vein.   Reproductive: Mildly enlarged prostate gland. Right testicle appears to be retracted into the inguinal canal.   Other: There is moderate free ascites in the abdomen and pelvis.   Musculoskeletal: There is osteopenia with degenerative changes in the lumbar spine and multilevel endplate Schmorl's nodes.   IMPRESSION: 1. Cardiomegaly, with aortic ectasia and aortic valve replacement. 2. Pulmonary fibrosis with honeycombing, upper lobe predominance. Breathing motion limits assessment for subtle infiltrates. Some of the subpleural interstitial thickening in the bases could be due to interstitial edema is there are small right greater than left pleural effusions. 3. 1.6 x 1.5 cm pleural based opacity in the posterior right upper lobe which could be an inflammatory nodule, nodular atelectasis or scarring or neoplasm. PET-CT or three-month follow-up CT  recommended. 4. New significant change in mildly prominent mediastinal lymph nodes. 5. Enlarged spleen, prominent portal vein and some images suggesting cirrhosis of the liver, with moderate free ascites. 6.  Prominent distal esophageal and gastric wall, which could be due to portal hypertension or esophagogastritis. 7. Prostatomegaly with probable retraction of the right testicle into the right inguinal canal. 8. Mesenteric congestive features and mild lower abdominal body wall anasarca. 9. Cholelithiasis and remaining findings discussed above     Electronically Signed   By: Telford Nab M.D.   On: 02/11/2021 05:37  CLINICAL DATA:  81 year old male with history of cirrhosis.   EXAM: DUPLEX ULTRASOUND OF LIVER   TECHNIQUE: Color and duplex Doppler ultrasound was performed to evaluate the hepatic in-flow and out-flow vessels.   COMPARISON:  CT abdomen pelvis from earlier the same day, abdominal ultrasound from 04/19/2013.   FINDINGS: Liver: Normal echogenicity. Coarsened echotexture. Nodular contour. The right lobe appears shrunken.   No focal lesion, mass or intrahepatic biliary ductal dilatation.   Gallbladder: Decompressed with mild diffuse wall thickening. There is a shadowing gallstone in the infundibulum measuring up to 1.1 cm.   Main Portal Vein size: 1.3 cm   Portal Vein Velocities (all antegrade)   Main Prox:  30 cm/sec   Main Mid: 25 cm/sec   Main Dist:  22 cm/sec Right: 18 cm/sec Left: 20 cm/sec   Hepatic Vein Velocities (all antegrade)   Right:  16 cm/sec   Middle:  33 cm/sec   Left:  38 cm/sec   IVC: Present and patent with normal respiratory phasicity.   Hepatic Artery Velocity: Not visualized.   Splenic Vein Velocity:  24 cm/sec   Spleen: 16.3 cm x 15 cm x 10.5 cm with a total volume of 1,340 cm^3 (411 cm^3 is upper limit normal)   Portal Vein Occlusion/Thrombus: No   Splenic Vein Occlusion/Thrombus: No   Ascites: Moderate volume perihepatic ascites is visualized.   Varices: None   IMPRESSION: 1. Morphologic changes of hepatic cirrhosis and secondary signs of portal hypertension including splenomegaly and moderate ascites.  The portal and hepatic veins are patent with normal directions of flow. 2. Cholelithiasis without sonographic evidence of cholecystitis.   Ruthann Cancer, MD   [facility]   [facility]     Electronically Signed   By: Ruthann Cancer M.D.   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  An EGD would normally be performed in this setting to screen for esophageal varices.  However, given the frailty, poor functional status and significant cardiopulmonary comorbidities of the patient, I think the risks of an EGD would outweigh the benefits.  We did discuss potentially empirically starting a nonselective beta-blocker, but the patient's metoprolol was recently stopped because of low BP; it would be unlikely that he would tolerate an NSBB at an effective dose.   He has not had any signs of GI bleeding thus far.  I recommended against EGD or empirically starting a NSBB at this time.   We discussed the pathophysiology of cirrhosis and the various complications, as well as the decreased life expectancy associated with decompensated cirrhosis.  I do not think that the patient had hepatic encephalopathy based  on the absence of a clinical history of altered cognition and I did not recommend continuing lactulose except on an as needed basis to treat constipation. We will need to monitor his renal function and electrolytes carefully given his baseline renal insufficiency.  Cirrhosis, likely NASH, decompensated, MELD 19/MELDNa 25 with ascites - Continue lasix 20 mg PO daily/aldactone 50 mg PO daily - Low sodium diet - HCC screening in May  - Recheck BMP today -  Recommend against variceal screening or empiric NSBB treatment due to patient's frailty/comorbidities  Milan Perkins E. Candis Schatz, MD [facility]   CC:  Nolene Ebbs, MD

## 2021-04-08 NOTE — Patient Instructions (Signed)
_______________________________________________________  If you are age 80 or older, your body mass index should be between 23-30. Your Body mass index is 29.49 kg/m. If this is out of the aforementioned range listed, please consider follow up with your Primary Care Provider.  If you are age 64 or younger, your body mass index should be between 19-25. Your Body mass index is 29.49 kg/m. If this is out of the aformentioned range listed, please consider follow up with your Primary Care Provider.   Your provider has requested that you go to the basement level for lab work before leaving today. Press "B" on the elevator. The lab is located at the first door on the left as you exit the elevator.  The  GI providers would like to encourage you to use MYCHART to communicate with providers for non-urgent requests or questions.  Due to long hold times on the telephone, sending your provider a message by MYCHART may be a faster and more efficient way to get a response.  Please allow 48 business hours for a response.  Please remember that this is for non-urgent requests.   It was a pleasure to see you today!  Thank you for trusting me with your gastrointestinal care!    [name] E. Cunningham, MD    

## 2021-04-14 NOTE — Progress Notes (Signed)
[name],  Your kidney function is stable, but your sodium level is getting quite low.  This can be due to the furosemide (lasix).  I recommend you lower the lasix dose to 20 mg every other day.  I would like to repeat a chemistry panel in 2 weeks to see if the sodium is improving.  It is important to know that increasing sodium consumption in your diet WILL NOT improve the blood sodium level, and will only worsen the fluid collection in the abdomen.  So please continue a low sodium diet (less than 2 grams/day). Please let me know if you have further questions.

## 2021-05-12 ENCOUNTER — Other Ambulatory Visit: Payer: Self-pay | Admitting: Internal Medicine

## 2021-05-13 LAB — BASIC METABOLIC PANEL WITH GFR
BUN/Creatinine Ratio: 12 (calc) (ref 6–22)
BUN: 23 mg/dL (ref 7–25)
CO2: 21 mmol/L (ref 20–32)
Calcium: 7.9 mg/dL — ABNORMAL LOW (ref 8.6–10.3)
Chloride: 87 mmol/L — ABNORMAL LOW (ref 98–110)
Creat: 1.96 mg/dL — ABNORMAL HIGH (ref 0.70–1.28)
Glucose, Bld: 102 mg/dL — ABNORMAL HIGH (ref 65–99)
Potassium: 5.6 mmol/L — ABNORMAL HIGH (ref 3.5–5.3)
Sodium: 117 mmol/L — CL (ref 135–146)
eGFR: 34 mL/min/{1.73_m2} — ABNORMAL LOW (ref >=60)

## 2021-05-13 LAB — EXTRA LAV TOP TUBE

## 2021-06-13 ENCOUNTER — Telehealth: Payer: Self-pay

## 2021-06-13 NOTE — Telephone Encounter (Signed)
Attempted to contact patient and patient's son Ryan Burke to schedule a Palliative Care consult appointment. No answer left a message to return call.  ? ?

## 2021-06-17 ENCOUNTER — Telehealth: Payer: Self-pay

## 2021-06-17 NOTE — Telephone Encounter (Signed)
Attempted to contact patient and patient's son Ryan Burke to schedule a Palliative Care consult appointment. No answer left a message to return call.  ? ?

## 2021-06-20 ENCOUNTER — Telehealth: Payer: Self-pay

## 2021-06-20 NOTE — Telephone Encounter (Signed)
Attempted to contact patient and patient's son Ryan Burke to schedule a Palliative Care consult appointment. No answer left a message to return call.  ? ?

## 2021-08-21 ENCOUNTER — Other Ambulatory Visit: Payer: Self-pay | Admitting: Physician Assistant

## 2022-04-21 ENCOUNTER — Ambulatory Visit (INDEPENDENT_AMBULATORY_CARE_PROVIDER_SITE_OTHER): Payer: Medicare Other | Admitting: Gastroenterology

## 2022-04-21 ENCOUNTER — Encounter: Payer: Self-pay | Admitting: Gastroenterology

## 2022-04-21 ENCOUNTER — Other Ambulatory Visit (INDEPENDENT_AMBULATORY_CARE_PROVIDER_SITE_OTHER): Payer: Medicare Other

## 2022-04-21 VITALS — BP 114/76 | HR 87 | Ht 60.0 in

## 2022-04-21 DIAGNOSIS — K7469 Other cirrhosis of liver: Secondary | ICD-10-CM | POA: Diagnosis not present

## 2022-04-21 DIAGNOSIS — R188 Other ascites: Secondary | ICD-10-CM | POA: Diagnosis not present

## 2022-04-21 DIAGNOSIS — K7460 Unspecified cirrhosis of liver: Secondary | ICD-10-CM

## 2022-04-21 LAB — COMPREHENSIVE METABOLIC PANEL
ALT: 19 U/L (ref 0–53)
AST: 25 U/L (ref 0–37)
Albumin: 3 g/dL — ABNORMAL LOW (ref 3.5–5.2)
Alkaline Phosphatase: 160 U/L — ABNORMAL HIGH (ref 39–117)
BUN: 25 mg/dL — ABNORMAL HIGH (ref 6–23)
CO2: 24 mEq/L (ref 19–32)
Calcium: 8 mg/dL — ABNORMAL LOW (ref 8.4–10.5)
Chloride: 103 mEq/L (ref 96–112)
Creatinine, Ser: 2.37 mg/dL — ABNORMAL HIGH (ref 0.40–1.50)
GFR: 25.17 mL/min — ABNORMAL LOW (ref >60.00)
Glucose, Bld: 71 mg/dL (ref 70–99)
Potassium: 5 mEq/L (ref 3.5–5.1)
Sodium: 132 mEq/L — ABNORMAL LOW (ref 135–145)
Total Bilirubin: 0.9 mg/dL (ref 0.2–1.2)
Total Protein: 7 g/dL (ref 6.0–8.3)

## 2022-04-21 LAB — CBC WITH DIFFERENTIAL/PLATELET
Basophils Absolute: 0 10*3/uL (ref 0.0–0.1)
Basophils Relative: 0.5 % (ref 0.0–3.0)
Eosinophils Absolute: 0.6 10*3/uL (ref 0.0–0.7)
Eosinophils Relative: 7.1 % — ABNORMAL HIGH (ref 0.0–5.0)
HCT: 31.1 % — ABNORMAL LOW (ref 39.0–52.0)
Hemoglobin: 10.5 g/dL — ABNORMAL LOW (ref 13.0–17.0)
Lymphocytes Relative: 14.9 % (ref 12.0–46.0)
Lymphs Abs: 1.3 10*3/uL (ref 0.7–4.0)
MCHC: 33.8 g/dL (ref 30.0–36.0)
MCV: 84.4 fl (ref 78.0–100.0)
Monocytes Absolute: 0.7 10*3/uL (ref 0.1–1.0)
Monocytes Relative: 7.8 % (ref 3.0–12.0)
Neutro Abs: 6.1 10*3/uL (ref 1.4–7.7)
Neutrophils Relative %: 69.7 % (ref 43.0–77.0)
Platelets: 138 10*3/uL — ABNORMAL LOW (ref 150.0–400.0)
RBC: 3.68 Mil/uL — ABNORMAL LOW (ref 4.22–5.81)
RDW: 15.4 % (ref 11.5–15.5)
WBC: 8.8 10*3/uL (ref 4.0–10.5)

## 2022-04-21 LAB — PROTIME-INR
INR: 1.5 ratio — ABNORMAL HIGH (ref 0.8–1.0)
Prothrombin Time: 15.8 s — ABNORMAL HIGH (ref 9.6–13.1)

## 2022-04-21 NOTE — Progress Notes (Signed)
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 He is accompanied by his son and daughter-in-law in clinic today who provide most of the history and translation. He was last seen in our office in Jan 2023, at which time his ascites was well controlled with low dose lasix and aldactone.  We elected not to pursue invasive testing such as a liver biopsy or variceal screening due to his advanced age, frailty and multiple serious morbidities.  We also elected to defer empiric NSBB due to recent problems with low BP.  Today, he reports doing well overall.  His functional status is still quite limited by his pulmonary disease and generalized weakness.  His ascites continues to be well controlled with lasix 20 mg daily and aldactone 50 mg daily.  No LE edema.  No yellowing of the eyes or skin.  No episodes of confusion or altered cognition.   His son notes that he seemed to have some swelling over the right side of the abdomen recently.  He has regular bowel movements, typically 3-4 per day.  Stools are often loose or poorly formed.  No blood in the stool.  His weight has been stable.  His appetite is good.  No nausea/vomiting.   Past Medical History:  Diagnosis Date   Aortic regurgitation    Asthma    Chest pain at rest    Chronic bronchitis (HCC)    Chronic kidney disease    Cirrhosis ([place])    Diabetes mellitus without complication ([place])    "has had it before; he's ok now; not on RC anymore" (08/27/2016)   GERD (gastroesophageal reflux disease)    Gout    Heart murmur    Hyperlipidemia    Hypertension    Mitral regurgitation    Seizures ([place]) 03/2013 X 1   "maybe because of a high fever"   Shortness of breath    "w/activity" (08/27/2016)     Past Surgical History:  Procedure Laterality  Date   ABDOMINAL AORTAGRAM N/A 04/12/2012   Procedure: ABDOMINAL Maxcine Ham;  Surgeon: Laverda Page, MD;  Location: [facility] CATH LAB;  Service: Cardiovascular;  Laterality: N/A;   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   Procedure: AORTIC VALVE REPLACEMENT (AVR);  Surgeon: Gaye Pollack, MD;  Location: [facility];  Service: Open Heart Surgery;  Laterality: N/A;   CARDIAC CATHETERIZATION     CARDIAC VALVE REPLACEMENT     CATARACT EXTRACTION W/ INTRAOCULAR LENS  IMPLANT, BILATERAL Bilateral    INTRAOPERATIVE TRANSESOPHAGEAL ECHOCARDIOGRAM  05/04/2012   Procedure: INTRAOPERATIVE TRANSESOPHAGEAL ECHOCARDIOGRAM;  Surgeon: Gaye Pollack, MD;  Location: [facility] OR;  Service: Open Heart Surgery;  Laterality: N/A;   IR PARACENTESIS  02/12/2021   LEFT AND RIGHT HEART CATHETERIZATION WITH CORONARY ANGIOGRAM N/A 04/12/2012   Procedure: LEFT AND RIGHT HEART CATHETERIZATION WITH CORONARY ANGIOGRAM;  Surgeon: Laverda Page, MD;  Location: [facility] CATH LAB;  Service: Cardiovascular;  Laterality: N/A;   Family History  Problem Relation Age of Onset   Heart disease Sister    Heart disease Son    Social History   Tobacco Use   Smoking status: Former    Packs/day: 0.75    Years: 42.00    Total pack years: 31.50    Types: Cigarettes    Start date: 1961  Quit date: 03/30/2001    Years since quitting: 21.0   Smokeless tobacco: Former    Types: Associate Professor Use: Never used  Substance Use Topics   Alcohol use: Not Currently    Alcohol/week: 1.0 standard drink of alcohol    Types: 1 Cans of beer per week   Drug use: No   Current Outpatient Medications  Medication Sig Dispense Refill   acetaminophen (TYLENOL) 500 MG tablet Take 500 mg by mouth every 6 (six) hours as needed for moderate pain or headache.     albuterol (VENTOLIN HFA) 108 (90 Base) MCG/ACT inhaler Inhale 2 puffs into the lungs every 6 (six) hours as needed for wheezing or shortness of breath. 8 g 2   aspirin EC 81 MG tablet Take 81 mg by  mouth daily.     benzonatate (TESSALON) 100 MG capsule Take 100-200 mg by mouth every 8 (eight) hours as needed.     budesonide-formoterol (SYMBICORT) 160-4.5 MCG/ACT inhaler Inhale 2 puffs into the lungs in the morning and at bedtime. 1 each 6   cetirizine (ZYRTEC) 10 MG tablet Take 10 mg by mouth daily as needed for allergies.     colchicine 0.6 MG tablet Take 0.6 mg by mouth 2 (two) times daily as needed (gout).     FARXIGA 5 MG TABS tablet Take 5 mg by mouth daily.     furosemide (LASIX) 20 MG tablet TAKE 1 TABLET(20 MG) BY MOUTH DAILY 30 tablet 3   JANUVIA 50 MG tablet Take 0.5 tablets (25 mg total) by mouth daily.     nitroGLYCERIN (NITROSTAT) 0.4 MG SL tablet Place 0.4 mg under the tongue every 5 (five) minutes as needed for chest pain.     NON FORMULARY Hydroxyzine hcl 500 mg     omeprazole (PRILOSEC) 20 MG capsule Take 20 mg by mouth 2 (two) times daily.      OXYGEN Inhale into the lungs. 2 liters 24/7     pravastatin (PRAVACHOL) 20 MG tablet Take 20 mg by mouth every evening.     simethicone (MYLICON) 80 MG chewable tablet Chew 80 mg by mouth every 6 (six) hours as needed for flatulence.     spironolactone (ALDACTONE) 50 MG tablet Take 1 tablet (50 mg total) by mouth daily. 30 tablet 3   tamsulosin (FLOMAX) 0.4 MG CAPS capsule Take 0.4 mg by mouth daily.     doxazosin (CARDURA) 4 MG tablet Take 4 mg by mouth daily. (Patient not taking: Reported on 04/21/2022)     No current facility-administered medications for this visit.   No Known Allergies   Review of Systems: All systems reviewed and negative except where noted in HPI.    No results found.  Physical Exam: BP 114/76   Pulse 87   Ht 5' (1.524 m)   BMI 29.49 kg/m  Constitutional: Pleasant,frail, elderly [place] male in no acute distress.  Seated in wheelchair.  Accompanied by son and daughter in law. HEENT: Normocephalic and atraumatic. Conjunctivae are normal. No scleral icterus. Neck supple.  Cardiovascular: Normal  rate, regular rhythm.  Pulmonary/chest: Normal respiratory effort on supplemental oxygen.  Velcro-like adventitious breath sounds bilaterally. Abdominal: Soft, nondistended, nontender. Bowel sounds active throughout. There are no masses palpable.  No abdominal swelling or asymmetry noted.  No ascites or umbilical hernia appreciated. Extremities: no edema Neurological: Alert and oriented to person place and time.  Requires significant assistance going from wheelchair to exam table.  No asterixis. Skin: Xerotic,  flaky skin.  Psychiatric: Normal mood and affect. Behavior is normal.  CBC    Component Value Date/Time   WBC 4.7 03/17/2021 1606   RBC 3.92 (L) 03/17/2021 1606   HGB 11.4 (L) 03/17/2021 1606   HCT 33.5 (L) 03/17/2021 1606   PLT 84.0 (L) 03/17/2021 1606   MCV 85.6 03/17/2021 1606   MCH 28.7 02/24/2021 0000   MCHC 33.9 03/17/2021 1606   RDW 14.9 03/17/2021 1606   LYMPHSABS 1.0 03/17/2021 1606   MONOABS 0.4 03/17/2021 1606   EOSABS 0.2 03/17/2021 1606   BASOSABS 0.0 03/17/2021 1606    CMP     Component Value Date/Time   NA 117 (LL) 05/12/2021 0000   K 5.6 (H) 05/12/2021 0000   CL 87 (L) 05/12/2021 0000   CO2 21 05/12/2021 0000   GLUCOSE 102 (H) 05/12/2021 0000   BUN 23 05/12/2021 0000   CREATININE 1.96 (H) 05/12/2021 0000   CALCIUM 7.9 (L) 05/12/2021 0000   PROT 7.4 03/17/2021 1606   ALBUMIN 3.0 (L) 03/17/2021 1606   AST 34 03/17/2021 1606   ALT 22 03/17/2021 1606   ALKPHOS 157 (H) 03/17/2021 1606   BILITOT 0.8 03/17/2021 1606   GFRNONAA 24 (L) 02/14/2021 0150   GFRNONAA 34 (L) 05/10/2020 0000   GFRAA 40 (L) 05/10/2020 0000     ASSESSMENT AND PLAN: 82 year old frail [place] male with oxygen-dependent pulmonary fibrosis, CKD, a-fib, DM, CAD, history of aortic valve replacement with 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  Previous MELD 19/MELDNa 25 with ascites.  He has been clinically stable for the past year with no further decompensations and  well controlled ascites.   No new recommendations today.  Continue current diuretic regimen.  Obtain HCC screening US, repeat MELD labs.  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 Candis Schatz, [facility]    Nolene Ebbs, MD

## 2022-04-21 NOTE — Patient Instructions (Signed)
If you are age 81 or older, your body mass index should be between 23-30. Your Body mass index is 29.49 kg/m. If this is out of the aforementioned range listed, please consider follow up with your Primary Care Provider.  If you are age 71 or younger, your body mass index should be between 19-25. Your Body mass index is 29.49 kg/m. If this is out of the aformentioned range listed, please consider follow up with your Primary Care Provider.   Your provider has requested that you go to the basement level for lab work before leaving today. Press "B" on the elevator. The lab is located at the first door on the left as you exit the elevator.  You have been scheduled for an abdominal ultrasound at [facility] (1st floor of hospital) on 04/29/22 at 9:30am. Please arrive 30 minutes prior to your appointment for registration. Make certain not to have anything to eat or drink 6 hours prior to your appointment. Should you need to reschedule your appointment, please contact radiology at [phone]. This test typically takes about 30 minutes to perform.   The [facility] providers would like to encourage you to use [facility] to communicate with providers for non-urgent requests or questions.  Due to long hold times on the telephone, sending your provider a message by [facility] may be a faster and more efficient way to get a response.  Please allow 48 business hours for a response.  Please remember that this is for non-urgent requests.   It was a pleasure to see you today!  Thank you for trusting me with your gastrointestinal care!    [name] E.Candis Schatz, MD

## 2022-04-22 ENCOUNTER — Other Ambulatory Visit: Payer: Self-pay | Admitting: Internal Medicine

## 2022-04-23 LAB — COMPLETE METABOLIC PANEL WITH GFR
AG Ratio: 0.8 (calc) — ABNORMAL LOW (ref 1.0–2.5)
ALT: 17 U/L (ref 9–46)
AST: 26 U/L (ref 10–35)
Albumin: 3.1 g/dL — ABNORMAL LOW (ref 3.6–5.1)
Alkaline phosphatase (APISO): 175 U/L — ABNORMAL HIGH (ref 35–144)
BUN/Creatinine Ratio: 11 (calc) (ref 6–22)
BUN: 24 mg/dL (ref 7–25)
CO2: 19 mmol/L — ABNORMAL LOW (ref 20–32)
Calcium: 7.8 mg/dL — ABNORMAL LOW (ref 8.6–10.3)
Chloride: 103 mmol/L (ref 98–110)
Creat: 2.28 mg/dL — ABNORMAL HIGH (ref 0.70–1.22)
Globulin: 3.8 g/dL (calc) — ABNORMAL HIGH (ref 1.9–3.7)
Glucose, Bld: 203 mg/dL — ABNORMAL HIGH (ref 65–99)
Potassium: 4.7 mmol/L (ref 3.5–5.3)
Sodium: 134 mmol/L — ABNORMAL LOW (ref 135–146)
Total Bilirubin: 1.2 mg/dL (ref 0.2–1.2)
Total Protein: 6.9 g/dL (ref 6.1–8.1)
eGFR: 28 mL/min/{1.73_m2} — ABNORMAL LOW (ref >=60)

## 2022-04-23 LAB — LIPID PANEL
Cholesterol: 103 mg/dL (ref <200)
HDL: 29 mg/dL — ABNORMAL LOW (ref >=40)
LDL Cholesterol (Calc): 56 mg/dL (calc)
Non-HDL Cholesterol (Calc): 74 mg/dL (calc) (ref <130)
Total CHOL/HDL Ratio: 3.6 (calc) (ref <5.0)
Triglycerides: 92 mg/dL (ref <150)

## 2022-04-23 LAB — TSH: TSH: 1.86 mIU/L (ref 0.40–4.50)

## 2022-04-23 LAB — CBC
HCT: 31.1 % — ABNORMAL LOW (ref 38.5–50.0)
Hemoglobin: 10.4 g/dL — ABNORMAL LOW (ref 13.2–17.1)
MCH: 28 pg (ref 27.0–33.0)
MCHC: 33.4 g/dL (ref 32.0–36.0)
MCV: 83.8 fL (ref 80.0–100.0)
MPV: 8.9 fL (ref 7.5–12.5)
Platelets: 123 10*3/uL — ABNORMAL LOW (ref 140–400)
RBC: 3.71 10*6/uL — ABNORMAL LOW (ref 4.20–5.80)
RDW: 13.4 % (ref 11.0–15.0)
WBC: 9.9 10*3/uL (ref 3.8–10.8)

## 2022-04-23 LAB — URIC ACID: Uric Acid, Serum: 10.5 mg/dL — ABNORMAL HIGH (ref 4.0–8.0)

## 2022-04-23 LAB — AFP TUMOR MARKER: AFP-Tumor Marker: 1.4 ng/mL (ref <6.1)

## 2022-04-29 ENCOUNTER — Ambulatory Visit (HOSPITAL_COMMUNITY)
Admission: RE | Admit: 2022-04-29 | Discharge: 2022-04-29 | Disposition: A | Discharge status: Home / Self Care | Payer: Medicare Other | Source: Ambulatory Visit | Attending: Gastroenterology | Admitting: Gastroenterology

## 2022-04-29 DIAGNOSIS — K7460 Unspecified cirrhosis of liver: Secondary | ICD-10-CM | POA: Diagnosis not present

## 2022-04-29 DIAGNOSIS — R188 Other ascites: Secondary | ICD-10-CM | POA: Diagnosis present

## 2022-04-29 NOTE — Progress Notes (Signed)
Ryan Burke,  Your ultrasound did not show any concerning masses or lesions.  In addition no ascites was seen.  You do have gallstones that could be a cause of abdominal pain, but would be unlikely to cause abdominal swelling.  I would not recommend having your gallbladder removed given your very high risk of complications from both the surgery and anesthesia.

## 2022-04-30 NOTE — Progress Notes (Signed)
Ryan Burke,  Your labs overall were stable or improved compared to a year ago.  No further testing is recommended at this time.

## 2022-09-14 ENCOUNTER — Other Ambulatory Visit: Payer: Self-pay | Admitting: Internal Medicine

## 2022-09-14 LAB — COMPLETE METABOLIC PANEL WITH GFR
AG Ratio: 1 (calc) (ref 1.0–2.5)
ALT: 25 U/L (ref 9–46)
AST: 37 U/L — ABNORMAL HIGH (ref 10–35)
Albumin: 3.2 g/dL — ABNORMAL LOW (ref 3.6–5.1)
Alkaline phosphatase (APISO): 174 U/L — ABNORMAL HIGH (ref 35–144)
BUN/Creatinine Ratio: 8 (calc) (ref 6–22)
BUN: 24 mg/dL (ref 7–25)
CO2: 22 mmol/L (ref 20–32)
Calcium: 7.9 mg/dL — ABNORMAL LOW (ref 8.6–10.3)
Chloride: 106 mmol/L (ref 98–110)
Creat: 2.84 mg/dL — ABNORMAL HIGH (ref 0.70–1.22)
Globulin: 3.2 g/dL (calc) (ref 1.9–3.7)
Glucose, Bld: 215 mg/dL — ABNORMAL HIGH (ref 65–99)
Potassium: 5.4 mmol/L — ABNORMAL HIGH (ref 3.5–5.3)
Sodium: 135 mmol/L (ref 135–146)
Total Bilirubin: 0.9 mg/dL (ref 0.2–1.2)
Total Protein: 6.4 g/dL (ref 6.1–8.1)
eGFR: 22 mL/min/{1.73_m2} — ABNORMAL LOW (ref >=60)

## 2022-09-14 LAB — EXTRA LAV TOP TUBE

## 2022-12-01 ENCOUNTER — Emergency Department (HOSPITAL_COMMUNITY): Payer: Medicare Other

## 2022-12-01 ENCOUNTER — Inpatient Hospital Stay (HOSPITAL_COMMUNITY)
Admission: EM | Admit: 2022-12-01 | Discharge: 2022-12-02 | DRG: 081 | Disposition: A | Discharge status: Hospice | Payer: Medicare Other | Attending: Internal Medicine | Admitting: Internal Medicine

## 2022-12-01 DIAGNOSIS — J4489 Other specified chronic obstructive pulmonary disease: Secondary | ICD-10-CM | POA: Diagnosis present

## 2022-12-01 DIAGNOSIS — G9389 Other specified disorders of brain: Secondary | ICD-10-CM

## 2022-12-01 DIAGNOSIS — E1159 Type 2 diabetes mellitus with other circulatory complications: Secondary | ICD-10-CM | POA: Insufficient documentation

## 2022-12-01 DIAGNOSIS — Z7982 Long term (current) use of aspirin: Secondary | ICD-10-CM

## 2022-12-01 DIAGNOSIS — D61818 Other pancytopenia: Secondary | ICD-10-CM | POA: Diagnosis present

## 2022-12-01 DIAGNOSIS — C7931 Secondary malignant neoplasm of brain: Secondary | ICD-10-CM | POA: Diagnosis present

## 2022-12-01 DIAGNOSIS — Z515 Encounter for palliative care: Secondary | ICD-10-CM

## 2022-12-01 DIAGNOSIS — R569 Unspecified convulsions: Secondary | ICD-10-CM | POA: Diagnosis present

## 2022-12-01 DIAGNOSIS — Z7984 Long term (current) use of oral hypoglycemic drugs: Secondary | ICD-10-CM

## 2022-12-01 DIAGNOSIS — Z7951 Long term (current) use of inhaled steroids: Secondary | ICD-10-CM

## 2022-12-01 DIAGNOSIS — Z953 Presence of xenogenic heart valve: Secondary | ICD-10-CM

## 2022-12-01 DIAGNOSIS — Z951 Presence of aortocoronary bypass graft: Secondary | ICD-10-CM

## 2022-12-01 DIAGNOSIS — I5032 Chronic diastolic (congestive) heart failure: Secondary | ICD-10-CM | POA: Diagnosis present

## 2022-12-01 DIAGNOSIS — E1122 Type 2 diabetes mellitus with diabetic chronic kidney disease: Secondary | ICD-10-CM | POA: Diagnosis present

## 2022-12-01 DIAGNOSIS — J8410 Pulmonary fibrosis, unspecified: Secondary | ICD-10-CM | POA: Diagnosis present

## 2022-12-01 DIAGNOSIS — E119 Type 2 diabetes mellitus without complications: Secondary | ICD-10-CM

## 2022-12-01 DIAGNOSIS — K7460 Unspecified cirrhosis of liver: Secondary | ICD-10-CM | POA: Diagnosis present

## 2022-12-01 DIAGNOSIS — Z66 Do not resuscitate: Secondary | ICD-10-CM | POA: Diagnosis present

## 2022-12-01 DIAGNOSIS — E785 Hyperlipidemia, unspecified: Secondary | ICD-10-CM | POA: Diagnosis present

## 2022-12-01 DIAGNOSIS — M109 Gout, unspecified: Secondary | ICD-10-CM | POA: Diagnosis present

## 2022-12-01 DIAGNOSIS — G936 Cerebral edema: Principal | ICD-10-CM | POA: Diagnosis present

## 2022-12-01 DIAGNOSIS — Z79899 Other long term (current) drug therapy: Secondary | ICD-10-CM

## 2022-12-01 DIAGNOSIS — K219 Gastro-esophageal reflux disease without esophagitis: Secondary | ICD-10-CM | POA: Diagnosis present

## 2022-12-01 DIAGNOSIS — E1169 Type 2 diabetes mellitus with other specified complication: Secondary | ICD-10-CM | POA: Diagnosis present

## 2022-12-01 DIAGNOSIS — I130 Hypertensive heart and chronic kidney disease with heart failure and stage 1 through stage 4 chronic kidney disease, or unspecified chronic kidney disease: Secondary | ICD-10-CM | POA: Diagnosis present

## 2022-12-01 DIAGNOSIS — Z9981 Dependence on supplemental oxygen: Secondary | ICD-10-CM

## 2022-12-01 DIAGNOSIS — Z8249 Family history of ischemic heart disease and other diseases of the circulatory system: Secondary | ICD-10-CM

## 2022-12-01 DIAGNOSIS — I152 Hypertension secondary to endocrine disorders: Secondary | ICD-10-CM | POA: Diagnosis present

## 2022-12-01 DIAGNOSIS — N184 Chronic kidney disease, stage 4 (severe): Secondary | ICD-10-CM | POA: Diagnosis present

## 2022-12-01 DIAGNOSIS — I080 Rheumatic disorders of both mitral and aortic valves: Secondary | ICD-10-CM | POA: Diagnosis present

## 2022-12-01 LAB — URINALYSIS, ROUTINE W REFLEX MICROSCOPIC
Bilirubin Urine: NEGATIVE
Glucose, UA: NEGATIVE mg/dL
Ketones, ur: NEGATIVE mg/dL
Nitrite: NEGATIVE
Protein, ur: NEGATIVE mg/dL
Specific Gravity, Urine: 1.005 (ref 1.005–1.030)
pH: 6 (ref 5.0–8.0)

## 2022-12-01 LAB — CBC WITH DIFFERENTIAL/PLATELET
Abs Immature Granulocytes: 0.01 10*3/uL (ref 0.00–0.07)
Basophils Absolute: 0 10*3/uL (ref 0.0–0.1)
Basophils Relative: 0 %
Eosinophils Absolute: 0.1 10*3/uL (ref 0.0–0.5)
Eosinophils Relative: 4 %
HCT: 24.4 % — ABNORMAL LOW (ref 39.0–52.0)
Hemoglobin: 7.7 g/dL — ABNORMAL LOW (ref 13.0–17.0)
Immature Granulocytes: 0 %
Lymphocytes Relative: 17 %
Lymphs Abs: 0.6 10*3/uL — ABNORMAL LOW (ref 0.7–4.0)
MCH: 24.8 pg — ABNORMAL LOW (ref 26.0–34.0)
MCHC: 31.6 g/dL (ref 30.0–36.0)
MCV: 78.5 fL — ABNORMAL LOW (ref 80.0–100.0)
Monocytes Absolute: 0.3 10*3/uL (ref 0.1–1.0)
Monocytes Relative: 10 %
Neutro Abs: 2.3 10*3/uL (ref 1.7–7.7)
Neutrophils Relative %: 69 %
Platelets: 70 10*3/uL — ABNORMAL LOW (ref 150–400)
RBC: 3.11 MIL/uL — ABNORMAL LOW (ref 4.22–5.81)
RDW: 17.5 % — ABNORMAL HIGH (ref 11.5–15.5)
WBC: 3.4 10*3/uL — ABNORMAL LOW (ref 4.0–10.5)
nRBC: 0 % (ref 0.0–0.2)

## 2022-12-01 LAB — COMPREHENSIVE METABOLIC PANEL
ALT: 22 U/L (ref 0–44)
AST: 30 U/L (ref 15–41)
Albumin: 2.2 g/dL — ABNORMAL LOW (ref 3.5–5.0)
Alkaline Phosphatase: 140 U/L — ABNORMAL HIGH (ref 38–126)
Anion gap: 10 (ref 5–15)
BUN: 26 mg/dL — ABNORMAL HIGH (ref 8–23)
CO2: 20 mmol/L — ABNORMAL LOW (ref 22–32)
Calcium: 7.8 mg/dL — ABNORMAL LOW (ref 8.9–10.3)
Chloride: 103 mmol/L (ref 98–111)
Creatinine, Ser: 2.97 mg/dL — ABNORMAL HIGH (ref 0.61–1.24)
GFR, Estimated: 20 mL/min — ABNORMAL LOW (ref >60)
Glucose, Bld: 165 mg/dL — ABNORMAL HIGH (ref 70–99)
Potassium: 4.5 mmol/L (ref 3.5–5.1)
Sodium: 133 mmol/L — ABNORMAL LOW (ref 135–145)
Total Bilirubin: 1 mg/dL (ref 0.3–1.2)
Total Protein: 6.1 g/dL — ABNORMAL LOW (ref 6.5–8.1)

## 2022-12-01 LAB — AMMONIA: Ammonia: 20 umol/L (ref 9–35)

## 2022-12-01 LAB — CBG MONITORING, ED: Glucose-Capillary: 155 mg/dL — ABNORMAL HIGH (ref 70–99)

## 2022-12-01 MED ORDER — SODIUM CHLORIDE 0.9 % IV SOLN
2000.0000 mg | Freq: Once | INTRAVENOUS | Status: AC
  Administered 2022-12-01: 2000 mg via INTRAVENOUS
  Filled 2022-12-01: qty 20

## 2022-12-01 MED ORDER — GADOBUTROL 1 MMOL/ML IV SOLN
8.0000 mL | Freq: Once | INTRAVENOUS | Status: AC | PRN
  Administered 2022-12-01: 8 mL via INTRAVENOUS

## 2022-12-01 NOTE — ED Provider Notes (Signed)
Accepted handoff at shift change from Sabra Heck, PA-C. Please see prior provider note for more detail.   Briefly: Patient is 82 y.o. with past medical history significant for CHF, cirrhosis, COPD, AVR/CABG, type 2 diabetes, hypertension presents to ED after having 2 seizures yesterday.  Witnessed seizures lasted a couple of minutes and were described as "full body jerking".  They report patient has been more tired following seizures and was postictal.  DDX: concern for brain metastases, primary brain tumor  Plan: Follow-up on MRI, consult with neuro for recommendations, determine dispo after workup complete    Results for orders placed or performed during the hospital encounter of 12/01/22  Comprehensive metabolic panel  Result Value Ref Range   Sodium 133 (L) 135 - 145 mmol/L   Potassium 4.5 3.5 - 5.1 mmol/L   Chloride 103 98 - 111 mmol/L   CO2 20 (L) 22 - 32 mmol/L   Glucose, Bld 165 (H) 70 - 99 mg/dL   BUN 26 (H) 8 - 23 mg/dL   Creatinine, Ser 1.61 (H) 0.61 - 1.24 mg/dL   Calcium 7.8 (L) 8.9 - 10.3 mg/dL   Total Protein 6.1 (L) 6.5 - 8.1 g/dL   Albumin 2.2 (L) 3.5 - 5.0 g/dL   AST 30 15 - 41 U/L   ALT 22 0 - 44 U/L   Alkaline Phosphatase 140 (H) 38 - 126 U/L   Total Bilirubin 1.0 0.3 - 1.2 mg/dL   GFR, Estimated 20 (L) >60 mL/min   Anion gap 10 5 - 15  CBC with Differential  Result Value Ref Range   WBC 3.4 (L) 4.0 - 10.5 K/uL   RBC 3.11 (L) 4.22 - 5.81 MIL/uL   Hemoglobin 7.7 (L) 13.0 - 17.0 g/dL   HCT 09.6 (L) 04.5 - 40.9 %   MCV 78.5 (L) 80.0 - 100.0 fL   MCH 24.8 (L) 26.0 - 34.0 pg   MCHC 31.6 30.0 - 36.0 g/dL   RDW 81.1 (H) 91.4 - 78.2 %   Platelets 70 (L) 150 - 400 K/uL   nRBC 0.0 0.0 - 0.2 %   Neutrophils Relative % 69 %   Neutro Abs 2.3 1.7 - 7.7 K/uL   Lymphocytes Relative 17 %   Lymphs Abs 0.6 (L) 0.7 - 4.0 K/uL   Monocytes Relative 10 %   Monocytes Absolute 0.3 0.1 - 1.0 K/uL   Eosinophils Relative 4 %   Eosinophils Absolute 0.1 0.0 - 0.5 K/uL    Basophils Relative 0 %   Basophils Absolute 0.0 0.0 - 0.1 K/uL   Immature Granulocytes 0 %   Abs Immature Granulocytes 0.01 0.00 - 0.07 K/uL   Ovalocytes PRESENT   Urinalysis, Routine w reflex microscopic -Urine, Clean Catch  Result Value Ref Range   Color, Urine STRAW (A) YELLOW   APPearance CLEAR CLEAR   Specific Gravity, Urine 1.005 1.005 - 1.030   pH 6.0 5.0 - 8.0   Glucose, UA NEGATIVE NEGATIVE mg/dL   Hgb urine dipstick SMALL (A) NEGATIVE   Bilirubin Urine NEGATIVE NEGATIVE   Ketones, ur NEGATIVE NEGATIVE mg/dL   Protein, ur NEGATIVE NEGATIVE mg/dL   Nitrite NEGATIVE NEGATIVE   Leukocytes,Ua TRACE (A) NEGATIVE   RBC / HPF 0-5 0 - 5 RBC/hpf   WBC, UA 0-5 0 - 5 WBC/hpf   Bacteria, UA RARE (A) NONE SEEN   Squamous Epithelial / HPF 0-5 0 - 5 /HPF   Mucus PRESENT   Ammonia  Result Value Ref Range  Ammonia 20 9 - 35 umol/L  POC CBG, ED  Result Value Ref Range   Glucose-Capillary 155 (H) 70 - 99 mg/dL   DG Pelvis 1-2 Views  Result Date: 12/01/2022 CLINICAL DATA:  New pain. Hip pain, seizure versus syncope yesterday. EXAM: PELVIS - 1-2 VIEW COMPARISON:  None Available. FINDINGS: The cortical margins of the bony pelvis are intact. No fracture. Pubic symphysis and sacroiliac joints are congruent. Both femoral heads are well-seated in the respective acetabula. Mild bilateral hip degenerative change, more so on the right. IMPRESSION: No fracture of the pelvis. Mild bilateral hip degenerative change. Electronically Signed   By: Narda Rutherford M.D.   On: 12/01/2022 23:08   DG Shoulder Left  Result Date: 12/01/2022 CLINICAL DATA:  New pain.  Seizure versus syncope yesterday. EXAM: LEFT SHOULDER - 2+ VIEW COMPARISON:  None Available. FINDINGS: There is no evidence of fracture or dislocation. There is no evidence of arthropathy or suspicious focal bone abnormality. No soft tissue calcifications soft tissues are unremarkable. IMPRESSION: Negative radiographs of the left shoulder.  Electronically Signed   By: Narda Rutherford M.D.   On: 12/01/2022 23:07   DG Chest 2 View  Result Date: 12/01/2022 CLINICAL DATA:  Exertional dyspnea. EXAM: CHEST - 2 VIEW COMPARISON:  Radiograph 02/10/2021.  CT 02/11/2021 FINDINGS: Prior median sternotomy with prosthetic aortic valve. Extends underlying chronic lung disease with diffuse interstitial thickening. Similar cardiomegaly. No pneumothorax or large pleural effusion. Stable osseous findings. IMPRESSION: Extensive chronic lung disease with diffuse interstitial thickening. Similar cardiomegaly. No acute findings by radiograph. Electronically Signed   By: Narda Rutherford M.D.   On: 12/01/2022 23:06   MR Brain W and Wo Contrast  Result Date: 12/01/2022 CLINICAL DATA:  Seizure, new-onset, no history of trauma posterior right parasagittal frontal lobe mass found on CT. EXAM: MRI HEAD WITHOUT AND WITH CONTRAST TECHNIQUE: Multiplanar, multiecho pulse sequences of the brain and surrounding structures were obtained without and with intravenous contrast. CONTRAST:  8mL GADAVIST GADOBUTROL 1 MMOL/ML IV SOLN COMPARISON:  Same day head CT FINDINGS: Motion limited study. Brain: Enhancing 2.0 x 1.6 cm mass in the high right parasagittal frontal lobe, likely intraparenchymal (series 16109, image 100). Additional 1.4 x 2.0 cm enhancing mass in the anterolateral left frontal lobe (series 60454, image 98). Extensive surrounding edema without substantial mass effect. No midline shift. No evidence of acute infarct, acute hemorrhage, or hydrocephalus. Vascular: Major arterial flow voids are maintained at the skull base. Skull and upper cervical spine: Normal marrow signal. Sinuses/Orbits: Mild paranasal sinus disease. No acute orbital findings. Other: No mastoid effusions. IMPRESSION: Two enhancing masses in bilateral frontal lobes, both measuring 2.0 cm and concerning for metastatic disease. Extensive surrounding edema without substantial mass effect. Electronically  Signed   By: Feliberto Harts M.D.   On: 12/01/2022 21:04   CT Head Wo Contrast  Result Date: 12/01/2022 CLINICAL DATA:  Seizure, new-onset, no history of trauma EXAM: CT HEAD WITHOUT CONTRAST TECHNIQUE: Contiguous axial images were obtained from the base of the skull through the vertex without intravenous contrast. RADIATION DOSE REDUCTION: This exam was performed according to the departmental dose-optimization program which includes automated exposure control, adjustment of the mA and/or kV according to patient size and/or use of iterative reconstruction technique. COMPARISON:  MRI 04/20/2013 and CT head 04/19/2013. FINDINGS: Brain: Lobe along the posterior right parasagittal frontal lobe there is a heterogeneous, mildly hyperdense intraparenchymal 1.9 cm mass (series 3, image 22). No significant mass effect. This lesion may have a small  bone of intralesional hemorrhage. No large mass occupying hemorrhage elsewhere. No midline shift. No hydrocephalus. Patchy white matter hypodensities, likely in part due to chronic microvascular ischemic change. Vascular: No hyperdense vessel identified. Skull: No acute fracture. Sinuses/Orbits: Paranasal sinus mucosal thickening. Other: No mastoid effusions. IMPRESSION: Suspected 1.9 cm posterior right parasagittal frontal lobe mass. Recommend MRI head with contrast to further evaluate. Electronically Signed   By: Feliberto Harts M.D.   On: 12/01/2022 14:51    Physical Exam  BP (!) 145/84 (BP Location: Left Arm)   Pulse 78   Temp 98.2 F (36.8 C) (Oral)   Resp 18   SpO2 100%   Physical Exam Vitals and nursing note reviewed.  Constitutional:      General: He is sleeping. He is not in acute distress.    Appearance: Normal appearance. He is ill-appearing. He is not diaphoretic.     Interventions: Nasal cannula in place.  Cardiovascular:     Rate and Rhythm: Normal rate and regular rhythm.  Pulmonary:     Effort: Pulmonary effort is normal.  Skin:     General: Skin is warm and dry.     Capillary Refill: Capillary refill takes less than 2 seconds.  Neurological:     Mental Status: Mental status is at baseline.  Psychiatric:        Mood and Affect: Mood normal.        Behavior: Behavior normal.     Procedures  Procedures  ED Course / MDM    Medical Decision Making Amount and/or Complexity of Data Reviewed Labs: ordered. Radiology: ordered.  Risk Prescription drug management. Decision regarding hospitalization.    Reviewed prior records which demonstrate patient had isolated seizure event in 2015 and had MRI done at that time which was negative.  I spoke with Dr. Jerrell Belfast regarding patient seizures and CT imaging results.  Dr. Marney Doctor recommends 2 g Keppra, Keppra twice daily, and routine EEG.  Patient will likely need oncology follow-up.  MRI with and without contrast was ordered and demonstrated 2 enhancing masses in bilateral frontal lobes, both measuring 2.0 cm concerning for metastatic disease.  There is extensive surrounding edema without substantial mass effect.  Upon reassessment, patient is sleeping comfortably with family at bedside.  Nasal cannula in place, patient on 2L, which is his baseline.  No obvious increased work of breathing at rest.  Discussed with family at bedside, who prefer to interpret for patient, results of imaging.  Patient's daughter-in-law expresses concern that patient condition over the last 3 days has been worsening.  Patient has been complaining of severe left shoulder and right hip pain.  Patient requires assistance ambulating to the bathroom, he has had increased work of breathing with little activity.  Family states they have not increased his oxygen requirement at home despite his increased work of breathing.  Patient has also appeared weaker and more fatigued and complains of worsening pain daily.  Patient with anemia lower than his baseline, but he denies melena or hematochezia.  Patient has not had  any seizure activity while in the ED over the last approximately 10 hours.  Patient is mentating at his baseline per family.    I requested consultation with on-call hospitalist provider and spoke with Dr. Darreld Mclean who agreed to come see patient.   Patient to be admitted to [facility] for new onset seizures and brain masses.       Lenard Simmer, PA-C 12/01/22 2343    Fulton Reek  H, MD 12/04/22 1818

## 2022-12-01 NOTE — ED Triage Notes (Signed)
Pt BIB [facility] for seizure like activity and hip pain.  Family endorses 2 episodes of body shaking with loc for a minute after yesterday.  No fall, no head injury pe [facility]. Pt also complains of right hip paine and pain in the left shoulder blade area.  EMS states pt has equal strength in his arms and was negative on their stroke screen. Pt is on 2L [place] at baseline.  94/58, 102/60 after of fluids, 98% 2L, CBG 151. HR normal.

## 2022-12-01 NOTE — Progress Notes (Signed)
Checked on pt status, pt still in hallway

## 2022-12-01 NOTE — ED Notes (Signed)
Patient transported to X-ray 

## 2022-12-01 NOTE — H&P (Signed)
History and Physical    Ryan Burke SEG:[id number] DOB: [birth year] DOA: 12/01/2022  PCP: Fleet Contras, MD  Patient coming from: Home  I have personally briefly reviewed patient's old medical records in [facility]  Chief Complaint: Seizure activity  HPI: Ryan Burke is a Nepali speaking 82 y.o. male with medical history significant for hepatic cirrhosis, CKD stage IV, pulmonary fibrosis, s/p bioprosthetic AVR, T2DM, HTN, HLD, chronic anemia and thrombocytopenia who presented to the ED for evaluation of seizure activity.  Family at bedside and preferred to interpret.  Patient had difficulty understanding and communicating with translator services.  Patient had 2 episodes of seizure activity occurring on 9/2 at 1017 100.  They were witnessed by family and described as full body jerking while patient was sitting in a chair.  He did not have any obvious injury.  It took him a couple minutes to get reacclimated after these episodes.  He has been generally weak recently.  He has been complaining of left shoulder and right hip pain which has been going on for the last few days.  ED Course  Labs/Imaging on admission: I have personally reviewed following labs and imaging studies.  Initial vitals showed BP 94/61, pulse 71, RR 16, temp 97.7 F, SpO2 96% on 2 L O2 via [place].  Labs show WBC 3.4, hemoglobin 7.7, platelets 70,000, sodium 133, potassium 4.5, bicarb 20, BUN 26, creatinine 2.97, serum glucose 165, AST 30, ALT 22, alk phos 140, total bilirubin 1.0, ammonia 20.  UA negative for UTI.  CT head without contrast showed suspected 1.9 cm posterior right parasagittal frontal lobe mass.  Follow-up MRI brain with and without contrast showed 2 enhancing masses in bilateral frontal lobes, both measuring 2.0 cm and concerning for metastatic disease.  Extensive surrounding edema without substantial mass effect noted.  2 view chest x-ray shows extensive chronic lung disease with diffuse interstitial  thickening.  Similar cardiomegaly.  No acute findings.  Left shoulder x-ray negative.  Pelvic x-ray negative for fracture.  Mild bilateral hip degenerative change noted.  ED provider spoke with neurology who recommended 2000 mg IV Keppra, routine EEG.  The hospitalist service was consulted to admit for further evaluation and management.  Review of Systems: All systems reviewed and are negative except as documented in history of present illness above.   Past Medical History:  Diagnosis Date   Aortic regurgitation    Asthma    Chest pain at rest    Chronic bronchitis (HCC)    Chronic kidney disease    Cirrhosis (HCC)    Diabetes mellitus without complication (HCC)    "has had it before; he's ok now; not on RC anymore" (08/27/2016)   GERD (gastroesophageal reflux disease)    Gout    Heart murmur    Hyperlipidemia    Hypertension    Mitral regurgitation    Seizures (HCC) 03/2013 X 1   "maybe because of a high fever"   Shortness of breath    "w/activity" (08/27/2016)    Past Surgical History:  Procedure Laterality Date   ABDOMINAL AORTAGRAM N/A 04/12/2012   Procedure: ABDOMINAL Ronny Flurry;  Surgeon: Pamella Pert, MD;  Location: [facility] CATH LAB;  Service: Cardiovascular;  Laterality: N/A;   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   Procedure: AORTIC VALVE REPLACEMENT (AVR);  Surgeon: [name], MD;  Location: [facility] OR;  Service: Open Heart Surgery;  Laterality: N/A;   CARDIAC CATHETERIZATION     CARDIAC VALVE REPLACEMENT  CATARACT EXTRACTION W/ INTRAOCULAR LENS  IMPLANT, BILATERAL Bilateral    INTRAOPERATIVE TRANSESOPHAGEAL ECHOCARDIOGRAM  05/04/2012   Procedure: INTRAOPERATIVE TRANSESOPHAGEAL ECHOCARDIOGRAM;  Surgeon: [name], MD;  Location: [facility] OR;  Service: Open Heart Surgery;  Laterality: N/A;   IR PARACENTESIS  02/12/2021   LEFT AND RIGHT HEART CATHETERIZATION WITH CORONARY ANGIOGRAM N/A 04/12/2012   Procedure: LEFT AND RIGHT HEART CATHETERIZATION WITH CORONARY ANGIOGRAM;   Surgeon: Pamella Pert, MD;  Location: [facility] CATH LAB;  Service: Cardiovascular;  Laterality: N/A;    Social History:  reports that he quit smoking about 21 years ago. His smoking use included cigarettes. He started smoking about 63 years ago. He has a 31.5 pack-year smoking history. He has quit using smokeless tobacco.  His smokeless tobacco use included chew. He reports that he does not currently use alcohol after a past usage of about 1.0 standard drink of alcohol per week. He reports that he does not use drugs.  No Known Allergies  Family History  Problem Relation Age of Onset   Heart disease Sister    Heart disease Son      Prior to Admission medications   Medication Sig Start Date End Date Taking? Authorizing Provider  acetaminophen (TYLENOL) 500 MG tablet Take 500 mg by mouth every 6 (six) hours as needed for moderate pain or headache.    [provider]  albuterol (VENTOLIN HFA) 108 (90 Base) MCG/ACT inhaler Inhale 2 puffs into the lungs every 6 (six) hours as needed for wheezing or shortness of breath. 10/14/20   Luciano Cutter, MD  aspirin EC 81 MG tablet Take 81 mg by mouth daily.    [provider]  benzonatate (TESSALON) 100 MG capsule Take 100-200 mg by mouth every 8 (eight) hours as needed. 03/10/21   [provider]  budesonide-formoterol (SYMBICORT) 160-4.5 MCG/ACT inhaler Inhale 2 puffs into the lungs in the morning and at bedtime. 10/14/20   Luciano Cutter, MD  cetirizine (ZYRTEC) 10 MG tablet Take 10 mg by mouth daily as needed for allergies. 12/29/18   [provider]  colchicine 0.6 MG tablet Take 0.6 mg by mouth 2 (two) times daily as needed (gout).    [provider]  doxazosin (CARDURA) 4 MG tablet Take 4 mg by mouth daily. Patient not taking: Reported on 04/21/2022    [provider]  FARXIGA 5 MG TABS tablet Take 5 mg by mouth daily. 12/22/18   [provider]  furosemide (LASIX) 20 MG tablet TAKE 1  TABLET(20 MG) BY MOUTH DAILY 08/22/21   Esterwood, Amy S, PA-C  JANUVIA 50 MG tablet Take 0.5 tablets (25 mg total) by mouth daily. 02/14/21   Joseph Art, DO  nitroGLYCERIN (NITROSTAT) 0.4 MG SL tablet Place 0.4 mg under the tongue every 5 (five) minutes as needed for chest pain.    [provider]  NON FORMULARY Hydroxyzine hcl 500 mg    [provider]  omeprazole (PRILOSEC) 20 MG capsule Take 20 mg by mouth 2 (two) times daily.     [provider]  OXYGEN Inhale into the lungs. 2 liters 24/7    [provider]  pravastatin (PRAVACHOL) 20 MG tablet Take 20 mg by mouth every evening.    [provider]  simethicone (MYLICON) 80 MG chewable tablet Chew 80 mg by mouth every 6 (six) hours as needed for flatulence.    [provider]  spironolactone (ALDACTONE) 50 MG tablet Take 1 tablet (50 mg  total) by mouth daily. 03/17/21   Esterwood, Amy S, PA-C  tamsulosin (FLOMAX) 0.4 MG CAPS capsule Take 0.4 mg by mouth daily. 03/02/22   [provider]    Physical Exam: Vitals:   12/01/22 1327 12/01/22 1553 12/01/22 2000 12/01/22 2315  BP:  119/78 (!) 146/83 (!) 145/84  Pulse:  63 75 78  Resp:  16 18 18   Temp:  (!) 97.5 F (36.4 C) 98.2 F (36.8 C) 98.2 F (36.8 C)  TempSrc:  Oral Temporal Oral  SpO2: 96% 100% 100% 100%   Constitutional: Resting in bed, NAD, calm, comfortable Eyes: EOMI, lids and conjunctivae normal ENMT: Mucous membranes are moist. Posterior pharynx clear of any exudate or lesions.Normal dentition.  Neck: normal, supple, no masses. Respiratory: Fine inspiratory crackles throughout the lung fields. Normal respiratory effort. No accessory muscle use.  Cardiovascular: Regular rate and rhythm, systolic murmur with valvular click. No extremity edema. 2+ pedal pulses. Abdomen: no tenderness, no masses palpated.  Musculoskeletal: no clubbing / cyanosis. No joint deformity upper and lower extremities. Good ROM, no  contractures. Normal muscle tone.  Skin: no rashes, lesions, ulcers. No induration Neurologic: CN 2-12 grossly intact. Sensation intact. Strength 5/5 in all 4.  Psychiatric: Normal judgment and insight. Alert and oriented x 3. Normal mood.   EKG: Personally reviewed. Sinus rhythm, rate 64, no acute ischemic changes.  Motion artifact present.  Previous EKG showed sinus rhythm, rate 56, PACs.  Assessment/Plan Principal Problem:   Seizures (HCC) Active Problems:   Frontal mass of brain   Hypertension associated with diabetes (HCC)   Hyperlipidemia associated with type 2 diabetes mellitus (HCC)   CKD (chronic kidney disease) stage 4, GFR 15-29 ml/min (HCC)   Pancytopenia (HCC)   Pulmonary fibrosis (HCC)   Type 2 diabetes mellitus (HCC)   Ryan Burke is a Nepali speaking 82 y.o. male with medical history significant for hepatic cirrhosis, CKD stage IV, pulmonary fibrosis, s/p bioprosthetic AVR, T2DM, HTN, HLD, chronic anemia and thrombocytopenia who presented with seizure activity and was found to have 2 frontal lobe masses on MRI concerning for metastatic disease.  Assessment and Plan: Seizure activity Frontal lobe brain masses: MRI shows 2 enhancing masses in bilateral frontal lobes, both measuring 2.0 cm concerning for metastatic disease.  Surrounding edema without mass effect noted.  Suspect these are etiology of seizure activity.  Of note CT chest 02/11/2021 showed a posterior RUL nodule.  Does not appear that he has followed up since then. -IV Keppra 1000 mg twice daily -Routine EEG -Started on IV Decadron 4 mg every 6 hours -Will need oncology consult in a.m.  CKD stage IV: Renal function has been progressively worsening this year.  Creatinine up to 2.97, may be new baseline versus AKI. -Gentle IV fluid hydration overnight -Hold Lasix, spironolactone  Pancytopenia: Hemoglobin down to 7.7 from previous 9.9.  Patient denies obvious bleeding.  Mild leukopenia as well as  thrombocytopenia without obvious bleeding. -Monitor hemoglobin and transfuse as needed  Pulmonary fibrosis: Persistent findings on CXR.  Continue Symbicort and DuoNebs as needed.  Type 2 diabetes: Placed on SSI.  Hypertension: BP is stable.  Holding spironolactone and Lasix as above.  Hyperlipidemia: Continue pravastatin.  S/p bioprosthetic AVR 2014   DVT prophylaxis: SCDs Start: 12/02/22 0017  Code Status:   Code Status: Limited: Do not attempt resuscitation (DNR) -DNR-LIMITED -Do Not Intubate/DNI   discussed with family at bedside, patient has stated no CPR several times during discussion. Family Communication: Daughter-in-law at bedside Disposition  Plan: From home, dispo pending clinical progress Consults called: EDP spoke with neurology Severity of Illness: The appropriate patient status for this patient is INPATIENT. Inpatient status is judged to be reasonable and necessary in order to provide the required intensity of service to ensure the patient's safety. The patient's presenting symptoms, physical exam findings, and initial radiographic and laboratory data in the context of their chronic comorbidities is felt to place them at high risk for further clinical deterioration. Furthermore, it is not anticipated that the patient will be medically stable for discharge from the hospital within 2 midnights of admission.   * I certify that at the point of admission it is my clinical judgment that the patient will require inpatient hospital care spanning beyond 2 midnights from the point of admission due to high intensity of service, high risk for further deterioration and high frequency of surveillance required.Darreld Mclean MD [facility]  If 7PM-7AM, please contact night-coverage [email or web address]  12/02/2022, 12:23 AM

## 2022-12-01 NOTE — ED Provider Notes (Signed)
[place] EMERGENCY DEPARTMENT AT [facility] Provider Note   CSN: [id number] Arrival date & time: 12/01/22  1259     History  Chief Complaint  Patient presents with   hip pain/ seizure vs syncope yesterday    Lazlo B Ismaili is a 82 y.o. male with past medical history of CHF, cirrhosis, COPD, AVR/CABG, type 2 diabetes, dyslipidemia, hypertension, gout who lives with his son and daughter-in-law at home presents to the emergency department for 2 to minute seizures that occurred yesterday at 1000 and 1700. 2 seizures yesterday were witnessed and described by family as "full body jerking" that occurred while patient was sitting in chair and deny injury. They report that he has been more tired following seizure and took "a couple minutes to get reacclimated ".  Family manages medications and reports patient complains. They deny fever or previous seizures.  Upon questioning patient, he is fully alert and oriented.  He denies chest pain, shortness of breath, cough, chills, urinary symptoms.  He complains of left scapular pain and right hip pain that started 3 days prior.  Of note, family requested to translate for patient as patient has difficulty understanding and communicating with translator but translator services were offered and available throughout ED visit.  HPI     Home Medications Prior to Admission medications   Medication Sig Start Date End Date Taking? Authorizing Provider  acetaminophen (TYLENOL) 500 MG tablet Take 500 mg by mouth every 6 (six) hours as needed for moderate pain or headache.    [provider]  albuterol (VENTOLIN HFA) 108 (90 Base) MCG/ACT inhaler Inhale 2 puffs into the lungs every 6 (six) hours as needed for wheezing or shortness of breath. 10/14/20   Luciano Cutter, MD  aspirin EC 81 MG tablet Take 81 mg by mouth daily.    [provider]  benzonatate (TESSALON) 100 MG capsule Take 100-200 mg by mouth every 8 (eight) hours as  needed. 03/10/21   [provider]  budesonide-formoterol (SYMBICORT) 160-4.5 MCG/ACT inhaler Inhale 2 puffs into the lungs in the morning and at bedtime. 10/14/20   Luciano Cutter, MD  cetirizine (ZYRTEC) 10 MG tablet Take 10 mg by mouth daily as needed for allergies. 12/29/18   [provider]  colchicine 0.6 MG tablet Take 0.6 mg by mouth 2 (two) times daily as needed (gout).    [provider]  doxazosin (CARDURA) 4 MG tablet Take 4 mg by mouth daily. Patient not taking: Reported on 04/21/2022    [provider]  FARXIGA 5 MG TABS tablet Take 5 mg by mouth daily. 12/22/18   [provider]  furosemide (LASIX) 20 MG tablet TAKE 1 TABLET(20 MG) BY MOUTH DAILY 08/22/21   Esterwood, Amy S, PA-C  JANUVIA 50 MG tablet Take 0.5 tablets (25 mg total) by mouth daily. 02/14/21   Joseph Art, DO  nitroGLYCERIN (NITROSTAT) 0.4 MG SL tablet Place 0.4 mg under the tongue every 5 (five) minutes as needed for chest pain.    [provider]  NON FORMULARY Hydroxyzine hcl 500 mg    [provider]  omeprazole (PRILOSEC) 20 MG capsule Take 20 mg by mouth 2 (two) times daily.     [provider]  OXYGEN Inhale into the lungs. 2 liters 24/7    [provider]  pravastatin (PRAVACHOL) 20 MG tablet Take 20 mg by mouth every evening.    [provider]  simethicone (MYLICON) 80 MG chewable tablet  Chew 80 mg by mouth every 6 (six) hours as needed for flatulence.    [provider]  spironolactone (ALDACTONE) 50 MG tablet Take 1 tablet (50 mg total) by mouth daily. 03/17/21   Esterwood, Amy S, PA-C  tamsulosin (FLOMAX) 0.4 MG CAPS capsule Take 0.4 mg by mouth daily. 03/02/22   [provider]      Allergies    Patient has no known allergies.    Review of Systems   Review of Systems  Constitutional:  Positive for fatigue. Negative for chills and fever.  HENT:  Negative for congestion and sore throat.    Respiratory:  Negative for cough, chest tightness and shortness of breath.   Cardiovascular:  Negative for chest pain and leg swelling.  Genitourinary:  Negative for dysuria, flank pain, hematuria and urgency.  Neurological:  Positive for seizures. Negative for dizziness, weakness and numbness.    Physical Exam Updated Vital Signs BP 94/61   Pulse 71   Temp 97.7 F (36.5 C) (Oral)   Resp 16   SpO2 96%  Physical Exam Vitals and nursing note reviewed.  Constitutional:      Appearance: Normal appearance.  HENT:     Nose: No congestion or rhinorrhea.     Mouth/Throat:     Mouth: Mucous membranes are moist.     Pharynx: No posterior oropharyngeal erythema.  Cardiovascular:     Rate and Rhythm: Normal rate.  Pulmonary:     Effort: Pulmonary effort is normal.     Breath sounds: Normal breath sounds. No wheezing.  Abdominal:     General: There is no distension.     Palpations: Abdomen is soft.     Tenderness: There is no abdominal tenderness. There is no right CVA tenderness, left CVA tenderness or guarding.  Skin:    General: Skin is warm.     Capillary Refill: Capillary refill takes less than 2 seconds.     Coloration: Skin is not pale.  Neurological:     General: No focal deficit present.     Mental Status: He is alert and oriented to person, place, and time. Mental status is at baseline.     Cranial Nerves: No cranial nerve deficit.     Motor: No weakness.     ED Results / Procedures / Treatments   Labs (all labs ordered are listed, but only abnormal results are displayed) Labs Reviewed  CBG MONITORING, ED - Abnormal; Notable for the following components:      Result Value   Glucose-Capillary 155 (*)    All other components within normal limits  COMPREHENSIVE METABOLIC PANEL  CBC WITH DIFFERENTIAL/PLATELET  URINALYSIS, ROUTINE W REFLEX MICROSCOPIC  AMMONIA    EKG None  Radiology CT Head Wo Contrast  Result Date: 12/01/2022 CLINICAL DATA:  Seizure,  new-onset, no history of trauma EXAM: CT HEAD WITHOUT CONTRAST TECHNIQUE: Contiguous axial images were obtained from the base of the skull through the vertex without intravenous contrast. RADIATION DOSE REDUCTION: This exam was performed according to the departmental dose-optimization program which includes automated exposure control, adjustment of the mA and/or kV according to patient size and/or use of iterative reconstruction technique. COMPARISON:  MRI 04/20/2013 and CT head 04/19/2013. FINDINGS: Brain: Lobe along the posterior right parasagittal frontal lobe there is a heterogeneous, mildly hyperdense intraparenchymal 1.9 cm mass (series 3, image 22). No significant mass effect. This lesion may have a small bone of intralesional hemorrhage. No large mass occupying hemorrhage elsewhere. No midline shift. No  hydrocephalus. Patchy white matter hypodensities, likely in part due to chronic microvascular ischemic change. Vascular: No hyperdense vessel identified. Skull: No acute fracture. Sinuses/Orbits: Paranasal sinus mucosal thickening. Other: No mastoid effusions. IMPRESSION: Suspected 1.9 cm posterior right parasagittal frontal lobe mass. Recommend MRI head with contrast to further evaluate. Electronically Signed   By: Feliberto Harts M.D.   On: 12/01/2022 14:51    Procedures Procedures    Medications Ordered in ED Medications - No data to display  ED Course/ Medical Decision Making/ A&P                                 Medical Decision Making  Sid 82 year old male who presents to the emergency department for evaluation of 2 seizures yesterday.  HPI for further details  Upon evaluation, patient is comfortably laying in hospital bed.  He is fully alert and oriented with complaints of left shoulder pain and right hip pain for the past 3 days prior to seizure. He denies injury or recent fall. CT head significant for heterogeneous mildly hyperdense intra parenchymal 1.9 cm mass (See CT report for  more detail) recommending MRI for further evaluation. No previous recent imaging to compare results to. Lab work pending. Plan to consult neuro regarding new onset of seizures and imaging findings.  Sign out to [facility].        Final Clinical Impression(s) / ED Diagnoses Final diagnoses:  Seizure [facility])    Rx / DC Orders ED Discharge Orders     None         Judithann Sheen, PA 12/01/22 1520    [place], [place], [place] 12/02/22 [phone]

## 2022-12-01 NOTE — ED Notes (Signed)
Patient transported to MRI 

## 2022-12-01 NOTE — Progress Notes (Signed)
While rounding in ED. I visited with Patient and provided hospitality to [facility]. Pt. In good spirits and family at bedside.  Chaplain available as needed.  Venida Jarvis, [place], B [facility], Pager [phone]

## 2022-12-01 NOTE — ED Notes (Signed)
ED TO INPATIENT HANDOFF REPORT  ED Nurse Name and Phone #:  Les Pou RN  [phone]  S Name/Age/Gender Ryan Burke 82 y.o. male Room/Bed: H022C/H022C  Code Status   Code Status: Prior  Home/SNF/Other Home Patient oriented to: self, place, time, and situation Is this baseline? Yes   Triage Complete: Triage complete  Chief Complaint sz  Triage Note Pt BIB [facility] for seizure like activity and hip pain.  Family endorses 2 episodes of body shaking with loc for a minute after yesterday.  No fall, no head injury pe [facility]. Pt also complains of right hip paine and pain in the left shoulder blade area.  EMS states pt has equal strength in his arms and was negative on their stroke screen. Pt is on 2L [place] at baseline.  94/58, 102/60 after of fluids, 98% 2L, CBG 151. HR normal.    Allergies No Known Allergies  Level of Care/Admitting Diagnosis ED Disposition     ED Disposition  Admit   Condition  --   Comment  The patient appears reasonably stabilized for admission considering the current resources, flow, and capabilities available in the ED at this time, and I doubt any other [facility] requiring further screening and/or treatment in the ED prior to admission is  present.          B Medical/Surgery History Past Medical History:  Diagnosis Date   Aortic regurgitation    Asthma    Chest pain at rest    Chronic bronchitis (HCC)    Chronic kidney disease    Cirrhosis (HCC)    Diabetes mellitus without complication (HCC)    "has had it before; he's ok now; not on RC anymore" (08/27/2016)   GERD (gastroesophageal reflux disease)    Gout    Heart murmur    Hyperlipidemia    Hypertension    Mitral regurgitation    Seizures (HCC) 03/2013 X 1   "maybe because of a high fever"   Shortness of breath    "w/activity" (08/27/2016)   Past Surgical History:  Procedure Laterality Date   ABDOMINAL AORTAGRAM N/A 04/12/2012   Procedure: ABDOMINAL Ronny Flurry;  Surgeon: Pamella Pert, MD;  Location: [facility] CATH LAB;  Service: Cardiovascular;  Laterality: N/A;   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   Procedure: AORTIC VALVE REPLACEMENT (AVR);  Surgeon: [name], MD;  Location: [facility] OR;  Service: Open Heart Surgery;  Laterality: N/A;   CARDIAC CATHETERIZATION     CARDIAC VALVE REPLACEMENT     CATARACT EXTRACTION W/ INTRAOCULAR LENS  IMPLANT, BILATERAL Bilateral    INTRAOPERATIVE TRANSESOPHAGEAL ECHOCARDIOGRAM  05/04/2012   Procedure: INTRAOPERATIVE TRANSESOPHAGEAL ECHOCARDIOGRAM;  Surgeon: [name], MD;  Location: [facility] OR;  Service: Open Heart Surgery;  Laterality: N/A;   IR PARACENTESIS  02/12/2021   LEFT AND RIGHT HEART CATHETERIZATION WITH CORONARY ANGIOGRAM N/A 04/12/2012   Procedure: LEFT AND RIGHT HEART CATHETERIZATION WITH CORONARY ANGIOGRAM;  Surgeon: Pamella Pert, MD;  Location: [facility] CATH LAB;  Service: Cardiovascular;  Laterality: N/A;     A IV Location/Drains/Wounds Patient Lines/Drains/Airways Status     Active Line/Drains/Airways     Name Placement date Placement time Site Days   Peripheral IV 12/01/22 20 G Anterior;Left Antecubital 12/01/22  1429  Antecubital  less than 1            Intake/Output Last 24 hours  Intake/Output Summary (Last 24 hours) at 12/01/2022 2314 Last data filed at 12/01/2022 1932 Gross per 24 hour  Intake 250 ml  Output --  Net 250 ml    Labs/Imaging Results for orders placed or performed during the hospital encounter of 12/01/22 (from the past 48 hour(s))  Ammonia     Status: None   Collection Time: 12/01/22  2:10 PM  Result Value Ref Range   Ammonia 20 9 - 35 umol/L    Comment: Performed at [facility], [address] [place], [place] [address]  POC CBG, ED     Status: Abnormal   Collection Time: 12/01/22  2:43 PM  Result Value Ref Range   Glucose-Capillary 155 (H) 70 - 99 mg/dL    Comment: Glucose reference range applies only to samples taken after fasting for at least 8 hours.  Comprehensive  metabolic panel     Status: Abnormal   Collection Time: 12/01/22  2:45 PM  Result Value Ref Range   Sodium 133 (L) 135 - 145 mmol/L   Potassium 4.5 3.5 - 5.1 mmol/L   Chloride 103 98 - 111 mmol/L   CO2 20 (L) 22 - 32 mmol/L   Glucose, Bld 165 (H) 70 - 99 mg/dL    Comment: Glucose reference range applies only to samples taken after fasting for at least 8 hours.   BUN 26 (H) 8 - 23 mg/dL   Creatinine, Ser 1.19 (H) 0.61 - 1.24 mg/dL   Calcium 7.8 (L) 8.9 - 10.3 mg/dL   Total Protein 6.1 (L) 6.5 - 8.1 g/dL   Albumin 2.2 (L) 3.5 - 5.0 g/dL   AST 30 15 - 41 U/L   ALT 22 0 - 44 U/L   Alkaline Phosphatase 140 (H) 38 - 126 U/L   Total Bilirubin 1.0 0.3 - 1.2 mg/dL   GFR, Estimated 20 (L) >60 mL/min    Comment: (NOTE) Calculated using the CKD-EPI Creatinine Equation (2021)    Anion gap 10 5 - 15    Comment: Performed at [facility], [address], [place], [place] [address]  CBC with Differential     Status: Abnormal   Collection Time: 12/01/22  2:45 PM  Result Value Ref Range   WBC 3.4 (L) 4.0 - 10.5 K/uL   RBC 3.11 (L) 4.22 - 5.81 MIL/uL   Hemoglobin 7.7 (L) 13.0 - 17.0 g/dL   HCT 95.6 (L) 21.3 - 08.6 %   MCV 78.5 (L) 80.0 - 100.0 fL   MCH 24.8 (L) 26.0 - 34.0 pg   MCHC 31.6 30.0 - 36.0 g/dL   RDW 57.8 (H) 46.9 - 62.9 %   Platelets 70 (L) 150 - 400 K/uL    Comment: Immature Platelet Fraction may be clinically indicated, consider ordering this additional test [id number] REPEATED TO VERIFY    nRBC 0.0 0.0 - 0.2 %   Neutrophils Relative % 69 %   Neutro Abs 2.3 1.7 - 7.7 K/uL   Lymphocytes Relative 17 %   Lymphs Abs 0.6 (L) 0.7 - 4.0 K/uL   Monocytes Relative 10 %   Monocytes Absolute 0.3 0.1 - 1.0 K/uL   Eosinophils Relative 4 %   Eosinophils Absolute 0.1 0.0 - 0.5 K/uL   Basophils Relative 0 %   Basophils Absolute 0.0 0.0 - 0.1 K/uL   Immature Granulocytes 0 %   Abs Immature Granulocytes 0.01 0.00 - 0.07 K/uL   Ovalocytes PRESENT     Comment: Performed at [facility], [address], [place], [place] [address]  Urinalysis, Routine w reflex microscopic -Urine, Clean Catch  Status: Abnormal   Collection Time: 12/01/22  4:28 PM  Result Value Ref Range   Color, Urine STRAW (A) YELLOW   APPearance CLEAR CLEAR   Specific Gravity, Urine 1.005 1.005 - 1.030   pH 6.0 5.0 - 8.0   Glucose, UA NEGATIVE NEGATIVE mg/dL   Hgb urine dipstick SMALL (A) NEGATIVE   Bilirubin Urine NEGATIVE NEGATIVE   Ketones, ur NEGATIVE NEGATIVE mg/dL   Protein, ur NEGATIVE NEGATIVE mg/dL   Nitrite NEGATIVE NEGATIVE   Leukocytes,Ua TRACE (A) NEGATIVE   RBC / HPF 0-5 0 - 5 RBC/hpf   WBC, UA 0-5 0 - 5 WBC/hpf   Bacteria, UA RARE (A) NONE SEEN   Squamous Epithelial / HPF 0-5 0 - 5 /HPF   Mucus PRESENT     Comment: Performed at [facility], [address] [place], [place] [address]   DG Pelvis 1-2 Views  Result Date: 12/01/2022 CLINICAL DATA:  New pain. Hip pain, seizure versus syncope yesterday. EXAM: PELVIS - 1-2 VIEW COMPARISON:  None Available. FINDINGS: The cortical margins of the bony pelvis are intact. No fracture. Pubic symphysis and sacroiliac joints are congruent. Both femoral heads are well-seated in the respective acetabula. Mild bilateral hip degenerative change, more so on the right. IMPRESSION: No fracture of the pelvis. Mild bilateral hip degenerative change. Electronically Signed   By: Narda Rutherford M.D.   On: 12/01/2022 23:08   DG Shoulder Left  Result Date: 12/01/2022 CLINICAL DATA:  New pain.  Seizure versus syncope yesterday. EXAM: LEFT SHOULDER - 2+ VIEW COMPARISON:  None Available. FINDINGS: There is no evidence of fracture or dislocation. There is no evidence of arthropathy or suspicious focal bone abnormality. No soft tissue calcifications soft tissues are unremarkable. IMPRESSION: Negative radiographs of the left shoulder. Electronically Signed   By: Narda Rutherford M.D.   On: 12/01/2022 23:07   DG Chest 2 View  Result Date:  12/01/2022 CLINICAL DATA:  Exertional dyspnea. EXAM: CHEST - 2 VIEW COMPARISON:  Radiograph 02/10/2021.  CT 02/11/2021 FINDINGS: Prior median sternotomy with prosthetic aortic valve. Extends underlying chronic lung disease with diffuse interstitial thickening. Similar cardiomegaly. No pneumothorax or large pleural effusion. Stable osseous findings. IMPRESSION: Extensive chronic lung disease with diffuse interstitial thickening. Similar cardiomegaly. No acute findings by radiograph. Electronically Signed   By: Narda Rutherford M.D.   On: 12/01/2022 23:06   MR Brain W and Wo Contrast  Result Date: 12/01/2022 CLINICAL DATA:  Seizure, new-onset, no history of trauma posterior right parasagittal frontal lobe mass found on CT. EXAM: MRI HEAD WITHOUT AND WITH CONTRAST TECHNIQUE: Multiplanar, multiecho pulse sequences of the brain and surrounding structures were obtained without and with intravenous contrast. CONTRAST:  8mL GADAVIST GADOBUTROL 1 MMOL/ML IV SOLN COMPARISON:  Same day head CT FINDINGS: Motion limited study. Brain: Enhancing 2.0 x 1.6 cm mass in the high right parasagittal frontal lobe, likely intraparenchymal (series 13086, image 100). Additional 1.4 x 2.0 cm enhancing mass in the anterolateral left frontal lobe (series 57846, image 98). Extensive surrounding edema without substantial mass effect. No midline shift. No evidence of acute infarct, acute hemorrhage, or hydrocephalus. Vascular: Major arterial flow voids are maintained at the skull base. Skull and upper cervical spine: Normal marrow signal. Sinuses/Orbits: Mild paranasal sinus disease. No acute orbital findings. Other: No mastoid effusions. IMPRESSION: Two enhancing masses in bilateral frontal lobes, both measuring 2.0 cm and concerning for metastatic disease. Extensive surrounding edema without substantial mass effect. Electronically Signed   By: Feliberto Harts  M.D.   On: 12/01/2022 21:04   CT Head Wo Contrast  Result Date:  12/01/2022 CLINICAL DATA:  Seizure, new-onset, no history of trauma EXAM: CT HEAD WITHOUT CONTRAST TECHNIQUE: Contiguous axial images were obtained from the base of the skull through the vertex without intravenous contrast. RADIATION DOSE REDUCTION: This exam was performed according to the departmental dose-optimization program which includes automated exposure control, adjustment of the mA and/or kV according to patient size and/or use of iterative reconstruction technique. COMPARISON:  MRI 04/20/2013 and CT head 04/19/2013. FINDINGS: Brain: Lobe along the posterior right parasagittal frontal lobe there is a heterogeneous, mildly hyperdense intraparenchymal 1.9 cm mass (series 3, image 22). No significant mass effect. This lesion may have a small bone of intralesional hemorrhage. No large mass occupying hemorrhage elsewhere. No midline shift. No hydrocephalus. Patchy white matter hypodensities, likely in part due to chronic microvascular ischemic change. Vascular: No hyperdense vessel identified. Skull: No acute fracture. Sinuses/Orbits: Paranasal sinus mucosal thickening. Other: No mastoid effusions. IMPRESSION: Suspected 1.9 cm posterior right parasagittal frontal lobe mass. Recommend MRI head with contrast to further evaluate. Electronically Signed   By: Feliberto Harts M.D.   On: 12/01/2022 14:51    Pending Labs Unresulted Labs (From admission, onward)    None       Vitals/Pain Today's Vitals   12/01/22 1927 12/01/22 2000 12/01/22 2029 12/01/22 2137  BP:  (!) 146/83    Pulse:  75    Resp:  18    Temp:  98.2 F (36.8 C)    TempSrc:  Temporal    SpO2:  100%    PainSc: 0-No pain 0-No pain Asleep Asleep    Isolation Precautions No active isolations  Medications Medications  levETIRAcetam (KEPPRA) 2,000 mg in sodium chloride 0.9 % 250 mL IVPB (0 mg Intravenous Stopped 12/01/22 1932)  gadobutrol (GADAVIST) 1 MMOL/ML injection 8 mL (8 mLs Intravenous Contrast Given 12/01/22 1837)     Mobility walks with person assist     Focused Assessments     R Recommendations: See Admitting Provider Note  Report given to:   Additional Notes:

## 2022-12-02 ENCOUNTER — Inpatient Hospital Stay (HOSPITAL_COMMUNITY): Payer: Medicare Other

## 2022-12-02 DIAGNOSIS — D61818 Other pancytopenia: Secondary | ICD-10-CM | POA: Diagnosis present

## 2022-12-02 DIAGNOSIS — J4489 Other specified chronic obstructive pulmonary disease: Secondary | ICD-10-CM | POA: Diagnosis present

## 2022-12-02 DIAGNOSIS — I130 Hypertensive heart and chronic kidney disease with heart failure and stage 1 through stage 4 chronic kidney disease, or unspecified chronic kidney disease: Secondary | ICD-10-CM | POA: Diagnosis present

## 2022-12-02 DIAGNOSIS — E119 Type 2 diabetes mellitus without complications: Secondary | ICD-10-CM

## 2022-12-02 DIAGNOSIS — K219 Gastro-esophageal reflux disease without esophagitis: Secondary | ICD-10-CM | POA: Diagnosis present

## 2022-12-02 DIAGNOSIS — R569 Unspecified convulsions: Secondary | ICD-10-CM | POA: Diagnosis not present

## 2022-12-02 DIAGNOSIS — M109 Gout, unspecified: Secondary | ICD-10-CM | POA: Diagnosis present

## 2022-12-02 DIAGNOSIS — Z7982 Long term (current) use of aspirin: Secondary | ICD-10-CM | POA: Diagnosis not present

## 2022-12-02 DIAGNOSIS — J8410 Pulmonary fibrosis, unspecified: Secondary | ICD-10-CM | POA: Diagnosis present

## 2022-12-02 DIAGNOSIS — K7460 Unspecified cirrhosis of liver: Secondary | ICD-10-CM | POA: Diagnosis present

## 2022-12-02 DIAGNOSIS — E785 Hyperlipidemia, unspecified: Secondary | ICD-10-CM

## 2022-12-02 DIAGNOSIS — Z953 Presence of xenogenic heart valve: Secondary | ICD-10-CM | POA: Diagnosis not present

## 2022-12-02 DIAGNOSIS — N184 Chronic kidney disease, stage 4 (severe): Secondary | ICD-10-CM

## 2022-12-02 DIAGNOSIS — Z951 Presence of aortocoronary bypass graft: Secondary | ICD-10-CM | POA: Diagnosis not present

## 2022-12-02 DIAGNOSIS — G9389 Other specified disorders of brain: Secondary | ICD-10-CM

## 2022-12-02 DIAGNOSIS — E1169 Type 2 diabetes mellitus with other specified complication: Secondary | ICD-10-CM

## 2022-12-02 DIAGNOSIS — E1159 Type 2 diabetes mellitus with other circulatory complications: Secondary | ICD-10-CM

## 2022-12-02 DIAGNOSIS — I080 Rheumatic disorders of both mitral and aortic valves: Secondary | ICD-10-CM | POA: Diagnosis present

## 2022-12-02 DIAGNOSIS — Z515 Encounter for palliative care: Secondary | ICD-10-CM | POA: Diagnosis not present

## 2022-12-02 DIAGNOSIS — I152 Hypertension secondary to endocrine disorders: Secondary | ICD-10-CM | POA: Diagnosis present

## 2022-12-02 DIAGNOSIS — Z66 Do not resuscitate: Secondary | ICD-10-CM | POA: Diagnosis present

## 2022-12-02 DIAGNOSIS — E1122 Type 2 diabetes mellitus with diabetic chronic kidney disease: Secondary | ICD-10-CM | POA: Diagnosis present

## 2022-12-02 DIAGNOSIS — G936 Cerebral edema: Secondary | ICD-10-CM | POA: Diagnosis present

## 2022-12-02 DIAGNOSIS — C7931 Secondary malignant neoplasm of brain: Secondary | ICD-10-CM | POA: Diagnosis present

## 2022-12-02 DIAGNOSIS — Z9981 Dependence on supplemental oxygen: Secondary | ICD-10-CM | POA: Diagnosis not present

## 2022-12-02 DIAGNOSIS — Z7984 Long term (current) use of oral hypoglycemic drugs: Secondary | ICD-10-CM | POA: Diagnosis not present

## 2022-12-02 DIAGNOSIS — I5032 Chronic diastolic (congestive) heart failure: Secondary | ICD-10-CM | POA: Diagnosis present

## 2022-12-02 LAB — HEMOGLOBIN A1C
Hgb A1c MFr Bld: 6.7 % — ABNORMAL HIGH (ref 4.8–5.6)
Mean Plasma Glucose: 145.59 mg/dL

## 2022-12-02 LAB — CBC
HCT: 26.7 % — ABNORMAL LOW (ref 39.0–52.0)
Hemoglobin: 8.3 g/dL — ABNORMAL LOW (ref 13.0–17.0)
MCH: 23.9 pg — ABNORMAL LOW (ref 26.0–34.0)
MCHC: 31.1 g/dL (ref 30.0–36.0)
MCV: 76.7 fL — ABNORMAL LOW (ref 80.0–100.0)
Platelets: 77 10*3/uL — ABNORMAL LOW (ref 150–400)
RBC: 3.48 MIL/uL — ABNORMAL LOW (ref 4.22–5.81)
RDW: 17.4 % — ABNORMAL HIGH (ref 11.5–15.5)
WBC: 3.8 10*3/uL — ABNORMAL LOW (ref 4.0–10.5)
nRBC: 0 % (ref 0.0–0.2)

## 2022-12-02 LAB — CBG MONITORING, ED
Glucose-Capillary: 137 mg/dL — ABNORMAL HIGH (ref 70–99)
Glucose-Capillary: 87 mg/dL (ref 70–99)

## 2022-12-02 LAB — COMPREHENSIVE METABOLIC PANEL
ALT: 26 U/L (ref 0–44)
AST: 31 U/L (ref 15–41)
Albumin: 2.4 g/dL — ABNORMAL LOW (ref 3.5–5.0)
Alkaline Phosphatase: 146 U/L — ABNORMAL HIGH (ref 38–126)
Anion gap: 13 (ref 5–15)
BUN: 25 mg/dL — ABNORMAL HIGH (ref 8–23)
CO2: 18 mmol/L — ABNORMAL LOW (ref 22–32)
Calcium: 7.9 mg/dL — ABNORMAL LOW (ref 8.9–10.3)
Chloride: 104 mmol/L (ref 98–111)
Creatinine, Ser: 2.93 mg/dL — ABNORMAL HIGH (ref 0.61–1.24)
GFR, Estimated: 21 mL/min — ABNORMAL LOW (ref >60)
Glucose, Bld: 103 mg/dL — ABNORMAL HIGH (ref 70–99)
Potassium: 5.1 mmol/L (ref 3.5–5.1)
Sodium: 135 mmol/L (ref 135–145)
Total Bilirubin: 1.4 mg/dL — ABNORMAL HIGH (ref 0.3–1.2)
Total Protein: 6.6 g/dL (ref 6.5–8.1)

## 2022-12-02 MED ORDER — MOMETASONE FURO-FORMOTEROL FUM 200-5 MCG/ACT IN AERO
2.0000 | INHALATION_SPRAY | Freq: Two times a day (BID) | RESPIRATORY_TRACT | Status: DC
  Administered 2022-12-02: 2 via RESPIRATORY_TRACT
  Filled 2022-12-02: qty 8.8

## 2022-12-02 MED ORDER — ACETAMINOPHEN 650 MG RE SUPP: 650.0000 mg | Freq: Four times a day (QID) | RECTAL | Status: DC | PRN

## 2022-12-02 MED ORDER — INSULIN ASPART 100 UNIT/ML IJ SOLN
0.0000 [IU] | Freq: Three times a day (TID) | INTRAMUSCULAR | Status: DC
  Administered 2022-12-02: 1 [IU] via SUBCUTANEOUS

## 2022-12-02 MED ORDER — LEVETIRACETAM IN NACL 1000 MG/100ML IV SOLN
1000.0000 mg | Freq: Two times a day (BID) | INTRAVENOUS | Status: DC
  Administered 2022-12-02: 1000 mg via INTRAVENOUS
  Filled 2022-12-02: qty 100

## 2022-12-02 MED ORDER — DEXAMETHASONE 4 MG PO TABS: 4.0000 mg | ORAL_TABLET | Freq: Two times a day (BID) | ORAL | 0 refills | Status: AC

## 2022-12-02 MED ORDER — ONDANSETRON HCL 4 MG PO TABS: 4.0000 mg | ORAL_TABLET | Freq: Four times a day (QID) | ORAL | Status: DC | PRN

## 2022-12-02 MED ORDER — ACETAMINOPHEN 325 MG PO TABS: 650.0000 mg | ORAL_TABLET | Freq: Four times a day (QID) | ORAL | Status: DC | PRN

## 2022-12-02 MED ORDER — LACTATED RINGERS IV SOLN: INTRAVENOUS | Status: AC

## 2022-12-02 MED ORDER — LEVETIRACETAM 500 MG PO TABS: 1000.0000 mg | ORAL_TABLET | Freq: Two times a day (BID) | ORAL | 2 refills | Status: AC

## 2022-12-02 MED ORDER — SODIUM BICARBONATE 650 MG PO TABS
1300.0000 mg | ORAL_TABLET | Freq: Two times a day (BID) | ORAL | Status: DC
  Administered 2022-12-02: 1300 mg via ORAL
  Filled 2022-12-02: qty 2

## 2022-12-02 MED ORDER — DEXAMETHASONE SODIUM PHOSPHATE 4 MG/ML IJ SOLN
4.0000 mg | Freq: Four times a day (QID) | INTRAMUSCULAR | Status: DC
  Administered 2022-12-02 (×2): 4 mg via INTRAVENOUS
  Filled 2022-12-02 (×2): qty 1

## 2022-12-02 MED ORDER — SENNOSIDES-DOCUSATE SODIUM 8.6-50 MG PO TABS: 1.0000 | ORAL_TABLET | Freq: Every evening | ORAL | Status: DC | PRN

## 2022-12-02 MED ORDER — SODIUM CHLORIDE 0.9 % IV SOLN: 4.0000 mg | Freq: Four times a day (QID) | INTRAVENOUS | Status: DC

## 2022-12-02 MED ORDER — SODIUM CHLORIDE 0.9% FLUSH
3.0000 mL | Freq: Two times a day (BID) | INTRAVENOUS | Status: DC
  Administered 2022-12-02: 3 mL via INTRAVENOUS

## 2022-12-02 MED ORDER — PRAVASTATIN SODIUM 10 MG PO TABS: 20.0000 mg | ORAL_TABLET | Freq: Every evening | ORAL | Status: DC

## 2022-12-02 MED ORDER — ONDANSETRON HCL 4 MG/2ML IJ SOLN: 4.0000 mg | Freq: Four times a day (QID) | INTRAMUSCULAR | Status: DC | PRN

## 2022-12-02 MED ORDER — IPRATROPIUM-ALBUTEROL 0.5-2.5 (3) MG/3ML IN SOLN: 3.0000 mL | Freq: Four times a day (QID) | RESPIRATORY_TRACT | Status: DC | PRN

## 2022-12-02 MED ORDER — PANTOPRAZOLE SODIUM 40 MG PO TBEC
40.0000 mg | DELAYED_RELEASE_TABLET | Freq: Two times a day (BID) | ORAL | Status: DC
  Administered 2022-12-02: 40 mg via ORAL
  Filled 2022-12-02: qty 1

## 2022-12-02 MED ORDER — BISACODYL 5 MG PO TBEC: 5.0000 mg | DELAYED_RELEASE_TABLET | Freq: Every day | ORAL | Status: DC | PRN

## 2022-12-02 MED ORDER — TAMSULOSIN HCL 0.4 MG PO CAPS
0.4000 mg | ORAL_CAPSULE | Freq: Every day | ORAL | Status: DC
  Administered 2022-12-02: 0.4 mg via ORAL
  Filled 2022-12-02: qty 1

## 2022-12-02 NOTE — Progress Notes (Signed)
EEG complete - results pending 

## 2022-12-02 NOTE — Discharge Summary (Signed)
Physician Discharge Summary  Ryan Burke:[id number] DOB: 1941-01-30 DOA: 12/01/2022  PCP: Fleet Contras, MD  Admit date: 12/01/2022 Discharge date: 12/02/2022  Admitted From: Home hospice  Discharge disposition: Home hospice   Recommendations for Outpatient Follow-Up:   Follow up with your primary care provider with hospice care provider as needed. Please resume hospice care on discharge and consider comfort care if he continues to deteriorate at home.  Discharge Diagnosis:   Principal Problem:   Seizures (HCC) Active Problems:   Frontal mass of brain   Hypertension associated with diabetes (HCC)   Hyperlipidemia associated with type 2 diabetes mellitus (HCC)   CKD (chronic kidney disease) stage 4, GFR 15-29 ml/min (HCC)   Pancytopenia (HCC)   Pulmonary fibrosis (HCC)   Type 2 diabetes mellitus (HCC)   Discharge Condition: Improved.  Diet recommendation:  Regular.  Wound care: None.  Code status: DNR   History of Present Illness:   Ryan Burke is a  82 y.o. male with medical history significant for hepatic cirrhosis, CKD stage IV, pulmonary fibrosis, s/p bioprosthetic AVR, T2DM, HTN, HLD, chronic anemia and thrombocytopenia  presented to the hospital with 2 episodes of seizure activity which were witnessed.  In the ED patient was slightly hypotensive, labs showed thrombocytopenia with platelet of 70K, mild anemia at hemoglobin of 7.7 creatinine elevated at 2.9.  Urinalysis was negative for UTI.  CT scan of the head showed 1.9 cm posterior right parasagittal frontal lobe mass.  MRI of the brain with contrast showed 2 enhancing mass lesions in the bilateral frontal lobes both measuring 2.0 cm and concerning for metastatic disease with extensive surrounding edema without mass effect.  Chest x-ray showed extensive chronic lung disease with diffuse interstitial thickening.  X-ray of the left shoulder was negative including pelvic x-ray.  ED provider spoke with neurology and  was given 2 g of IV Keppra, EEG ordered and was admitted hospital for further evaluation and treatment.   Hospital Course:   Following conditions were addressed during hospitalization as listed below,  Seizure activity secondary to frontal lobe brain masses: Of note, CT chest 02/11/2021 showed a posterior RUL nodule.  Does not appear that he has followed up since then.  Continue Keppra 1000 mg twice daily.   EEG shows diffuse encephalopathy.  Received Decadron 4 mg every 6 hourly.  At this time I had extensive discussion with the patient son at bedside including the patient and they wish to go home and resume hospice care.  Will prescribe the Keppra 1000 mg twice daily to continue and dexamethasone for few days on discharge.  Patient's son understands that his father's condition is serious with brain metastasis and possibility of brain herniation cerebral edema and declining course.  He wishes to proceed with hospice level of care if he were to decompensate.      CKD stage IV: Renal function has been progressively worsening this year.  Creatinine up to 2.97, may be new baseline versus AKI.  Received IV hydration.  Continue sodium bicarbonate, tamsulosin.     Pancytopenia: Hemoglobin down to 7.7 from previous 9.9.  No mention of bleeding.     Pulmonary fibrosis: Continue Symbicort and DuoNebs as needed.  On 2 L of oxygen at baseline.  Patient is currently on hospice level of care from pulmonary fibrosis.   Type 2 diabetes:  diet controlled.   Hypertension: On Lasix, spironolactone at home.  Cirrhosis of liver.  History of alcohol consumption.  On Lasix and spironolactone  at home   Hyperlipidemia: On atorvastatin.   S/p bioprosthetic AVR 2014  Goals of care.  Patient is currently at hospice level of care.  Presented with seizure.  Looks like brain metastasis patient's son at bedside does not wish to have his father undergo invasive procedures, aggressive care.  At this time plan is to go  home with hospice with antiseizure medication and let nature takes its course if things were to go downhill.  Disposition.  At this time, patient is stable for disposition to home with resumption of hospice.  Spoke with the patient in detail regarding disposition plan.  Medical Consultants:   None.  Procedures:    EEG Subjective:   Today, patient was seen and examined at bedside.  Communicated in the palate.  Denies any headache, nausea, vomiting wants to go home.  Patient/son at bedside and does not wish to proceed with aggressive workup.  Had seizure since admission.  Discharge Exam:   Vitals:   12/02/22 1215 12/02/22 1230  BP: (!) 149/92 (!) 149/85  Pulse: 85 97  Resp: 20 (!) 24  Temp:    SpO2: 99% 99%   Vitals:   12/02/22 1145 12/02/22 1200 12/02/22 1215 12/02/22 1230  BP: (!) 149/87 (!) 154/87 (!) 149/92 (!) 149/85  Pulse: 84 85 85 97  Resp: 18 20 20  (!) 24  Temp:      TempSrc:      SpO2: 99% 99% 99% 99%   There is no height or weight on file to calculate BMI.   General: Alert awake, not in obvious distress, on nasal cannula oxygen, elderly male, appears deconditioned and weak HENT: pupils equally reacting to light,  No scleral pallor or icterus noted. Oral mucosa is moist.  Chest:    Diminished breath sounds bilaterally.  Coarse breath sounds noted bilaterally CVS: S1 &S2 heard. No murmur.  Regular rate and rhythm. Abdomen: Soft, nontender, nondistended.  Bowel sounds are heard.   Extremities: No cyanosis, clubbing or edema.  Peripheral pulses are palpable. Psych: Alert, awake and oriented, normal mood CNS:  No cranial nerve deficits.  Moves all extremities Skin: Warm and dry.  No rashes noted.  The results of significant diagnostics from this hospitalization (including imaging, microbiology, ancillary and laboratory) are listed below for reference.     Diagnostic Studies:   EEG adult  Result Date: 12/02/2022 Charlsie Quest, MD     12/02/2022  8:54 AM  Patient Name: Ryan Burke MRN: [id number] Epilepsy Attending: Charlsie Quest Referring Physician/Provider: Lenard Simmer, PA-C Date: 12/02/2022 Duration: 23.51 mins Patient history: 82yo M with seizure and 2 enhancing masses in bilateral frontal lobes. EEG to evaluate for seizure Level of alertness: Awake, asleep AEDs during EEG study: LEV Technical aspects: This EEG study was done with scalp electrodes positioned according to the 10-20 International system of electrode placement. Electrical activity was reviewed with band pass filter of 1-70Hz , sensitivity of 7 uV/mm, display speed of 58mm/sec with a 60Hz  notched filter applied as appropriate. EEG data were recorded continuously and digitally stored.  Video monitoring was available and reviewed as appropriate. Description: The posterior dominant rhythm consists of 8 Hz activity of moderate voltage (25-35 uV) seen predominantly in posterior head regions, symmetric and reactive to eye opening and eye closing. Drowsiness was characterized by attenuation of the posterior background rhythm. EEG showed intermittent generalized 3 to 6 Hz theta-delta slowing. Hyperventilation and photic stimulation were not performed.   ABNORMALITY - Intermittent slow, generalized IMPRESSION: This  study is suggestive of mild diffuse encephalopathy, nonspecific etiology. No seizures or epileptiform discharges were seen throughout the recording. Charlsie Quest   DG Pelvis 1-2 Views  Result Date: 12/01/2022 CLINICAL DATA:  New pain. Hip pain, seizure versus syncope yesterday. EXAM: PELVIS - 1-2 VIEW COMPARISON:  None Available. FINDINGS: The cortical margins of the bony pelvis are intact. No fracture. Pubic symphysis and sacroiliac joints are congruent. Both femoral heads are well-seated in the respective acetabula. Mild bilateral hip degenerative change, more so on the right. IMPRESSION: No fracture of the pelvis. Mild bilateral hip degenerative change. Electronically Signed   By:  Narda Rutherford M.D.   On: 12/01/2022 23:08   DG Shoulder Left  Result Date: 12/01/2022 CLINICAL DATA:  New pain.  Seizure versus syncope yesterday. EXAM: LEFT SHOULDER - 2+ VIEW COMPARISON:  None Available. FINDINGS: There is no evidence of fracture or dislocation. There is no evidence of arthropathy or suspicious focal bone abnormality. No soft tissue calcifications soft tissues are unremarkable. IMPRESSION: Negative radiographs of the left shoulder. Electronically Signed   By: Narda Rutherford M.D.   On: 12/01/2022 23:07   DG Chest 2 View  Result Date: 12/01/2022 CLINICAL DATA:  Exertional dyspnea. EXAM: CHEST - 2 VIEW COMPARISON:  Radiograph 02/10/2021.  CT 02/11/2021 FINDINGS: Prior median sternotomy with prosthetic aortic valve. Extends underlying chronic lung disease with diffuse interstitial thickening. Similar cardiomegaly. No pneumothorax or large pleural effusion. Stable osseous findings. IMPRESSION: Extensive chronic lung disease with diffuse interstitial thickening. Similar cardiomegaly. No acute findings by radiograph. Electronically Signed   By: Narda Rutherford M.D.   On: 12/01/2022 23:06   MR Brain W and Wo Contrast  Result Date: 12/01/2022 CLINICAL DATA:  Seizure, new-onset, no history of trauma posterior right parasagittal frontal lobe mass found on CT. EXAM: MRI HEAD WITHOUT AND WITH CONTRAST TECHNIQUE: Multiplanar, multiecho pulse sequences of the brain and surrounding structures were obtained without and with intravenous contrast. CONTRAST:  8mL GADAVIST GADOBUTROL 1 MMOL/ML IV SOLN COMPARISON:  Same day head CT FINDINGS: Motion limited study. Brain: Enhancing 2.0 x 1.6 cm mass in the high right parasagittal frontal lobe, likely intraparenchymal (series 78295, image 100). Additional 1.4 x 2.0 cm enhancing mass in the anterolateral left frontal lobe (series 62130, image 98). Extensive surrounding edema without substantial mass effect. No midline shift. No evidence of acute infarct,  acute hemorrhage, or hydrocephalus. Vascular: Major arterial flow voids are maintained at the skull base. Skull and upper cervical spine: Normal marrow signal. Sinuses/Orbits: Mild paranasal sinus disease. No acute orbital findings. Other: No mastoid effusions. IMPRESSION: Two enhancing masses in bilateral frontal lobes, both measuring 2.0 cm and concerning for metastatic disease. Extensive surrounding edema without substantial mass effect. Electronically Signed   By: Feliberto Harts M.D.   On: 12/01/2022 21:04   CT Head Wo Contrast  Result Date: 12/01/2022 CLINICAL DATA:  Seizure, new-onset, no history of trauma EXAM: CT HEAD WITHOUT CONTRAST TECHNIQUE: Contiguous axial images were obtained from the base of the skull through the vertex without intravenous contrast. RADIATION DOSE REDUCTION: This exam was performed according to the departmental dose-optimization program which includes automated exposure control, adjustment of the mA and/or kV according to patient size and/or use of iterative reconstruction technique. COMPARISON:  MRI 04/20/2013 and CT head 04/19/2013. FINDINGS: Brain: Lobe along the posterior right parasagittal frontal lobe there is a heterogeneous, mildly hyperdense intraparenchymal 1.9 cm mass (series 3, image 22). No significant mass effect. This lesion may have a small bone of  intralesional hemorrhage. No large mass occupying hemorrhage elsewhere. No midline shift. No hydrocephalus. Patchy white matter hypodensities, likely in part due to chronic microvascular ischemic change. Vascular: No hyperdense vessel identified. Skull: No acute fracture. Sinuses/Orbits: Paranasal sinus mucosal thickening. Other: No mastoid effusions. IMPRESSION: Suspected 1.9 cm posterior right parasagittal frontal lobe mass. Recommend MRI head with contrast to further evaluate. Electronically Signed   By: Feliberto Harts M.D.   On: 12/01/2022 14:51     Labs:   Basic Metabolic Panel: Recent Labs  Lab  12/01/22 1445 12/02/22 0155  NA 133* 135  K 4.5 5.1  CL 103 104  CO2 20* 18*  GLUCOSE 165* 103*  BUN 26* 25*  CREATININE 2.97* 2.93*  CALCIUM 7.8* 7.9*   GFR CrCl cannot be calculated (Unknown ideal weight.). Liver Function Tests: Recent Labs  Lab 12/01/22 1445 12/02/22 0155  AST 30 31  ALT 22 26  ALKPHOS 140* 146*  BILITOT 1.0 1.4*  PROT 6.1* 6.6  ALBUMIN 2.2* 2.4*   No results for input(s): "LIPASE", "AMYLASE" in the last 168 hours. Recent Labs  Lab 12/01/22 1410  AMMONIA 20   Coagulation profile No results for input(s): "INR", "PROTIME" in the last 168 hours.  CBC: Recent Labs  Lab 12/01/22 1445 12/02/22 0155  WBC 3.4* 3.8*  NEUTROABS 2.3  --   HGB 7.7* 8.3*  HCT 24.4* 26.7*  MCV 78.5* 76.7*  PLT 70* 77*   Cardiac Enzymes: No results for input(s): "CKTOTAL", "CKMB", "CKMBINDEX", "TROPONINI" in the last 168 hours. BNP: Invalid input(s): "POCBNP" CBG: Recent Labs  Lab 12/01/22 1443 12/02/22 0039 12/02/22 0737  GLUCAP 155* 87 137*   D-Dimer No results for input(s): "DDIMER" in the last 72 hours. Hgb A1c Recent Labs    12/02/22 0155  HGBA1C 6.7*   Lipid Profile No results for input(s): "CHOL", "HDL", "LDLCALC", "TRIG", "CHOLHDL", "LDLDIRECT" in the last 72 hours. Thyroid function studies No results for input(s): "TSH", "T4TOTAL", "T3FREE", "THYROIDAB" in the last 72 hours.  Invalid input(s): "FREET3" Anemia work up No results for input(s): "VITAMINB12", "FOLATE", "FERRITIN", "TIBC", "IRON", "RETICCTPCT" in the last 72 hours. Microbiology No results found for this or any previous visit (from the past 240 hour(s)).   Discharge Instructions:   Discharge Instructions     Diet general   Complete by: As directed    Discharge instructions   Complete by: As directed    Follow-up with your hospice care provider after discharge.  Take medications as prescribed.  If worsening symptoms please communicate with the hospitalist team for further  care.   Increase activity slowly   Complete by: As directed       Allergies as of 12/02/2022   No Known Allergies      Medication List     TAKE these medications    acetaminophen 500 MG tablet Commonly known as: TYLENOL Take 500 mg by mouth every 6 (six) hours as needed for moderate pain or headache.   albuterol 108 (90 Base) MCG/ACT inhaler Commonly known as: VENTOLIN HFA Inhale 2 puffs into the lungs every 6 (six) hours as needed for wheezing or shortness of breath.   aspirin EC 81 MG tablet Take 81 mg by mouth daily.   benzonatate 100 MG capsule Commonly known as: TESSALON Take 100-200 mg by mouth every 8 (eight) hours as needed.   budesonide-formoterol 160-4.5 MCG/ACT inhaler Commonly known as: Symbicort Inhale 2 puffs into the lungs in the morning and at bedtime.   cetirizine 10 MG tablet  Commonly known as: ZYRTEC Take 10 mg by mouth daily as needed for allergies.   colchicine 0.6 MG tablet Take 0.6 mg by mouth 2 (two) times daily as needed (gout).   dexamethasone 4 MG tablet Commonly known as: DECADRON Take 1 tablet (4 mg total) by mouth 2 (two) times daily for 5 days.   furosemide 20 MG tablet Commonly known as: LASIX TAKE 1 TABLET(20 MG) BY MOUTH DAILY   hydrOXYzine 50 MG tablet Commonly known as: ATARAX Take 50 mg by mouth 2 (two) times daily as needed for itching.   levETIRAcetam 500 MG tablet Commonly known as: Keppra Take 2 tablets (1,000 mg total) by mouth 2 (two) times daily.   nitroGLYCERIN 0.4 MG SL tablet Commonly known as: NITROSTAT Place 0.4 mg under the tongue every 5 (five) minutes as needed for chest pain.   omeprazole 20 MG capsule Commonly known as: PRILOSEC Take 20 mg by mouth 2 (two) times daily.   OXYGEN Inhale 2 L into the lungs continuous.   pravastatin 20 MG tablet Commonly known as: PRAVACHOL Take 20 mg by mouth every evening.   sodium bicarbonate 650 MG tablet Take 1,300 mg by mouth 2 (two) times daily.    spironolactone 50 MG tablet Commonly known as: ALDACTONE Take 1 tablet (50 mg total) by mouth daily. What changed: how much to take   tamsulosin 0.4 MG Caps capsule Commonly known as: FLOMAX Take 0.4 mg by mouth daily.   tiZANidine 2 MG tablet Commonly known as: ZANAFLEX Take 2 mg by mouth 2 (two) times daily as needed for muscle spasms.          Time coordinating discharge: 39 minutes  Signed:  Jhoselyn Ruffini  [facility] 12/02/2022, 1:51 PM

## 2022-12-02 NOTE — Progress Notes (Signed)
EEG attempted, pt using bathroom in room. Reatempting as schedule allows.

## 2022-12-02 NOTE — Procedures (Signed)
Patient Name: Ryan Burke  MRN: [id number]  Epilepsy Attending: Charlsie Quest  Referring Physician/Provider: Lenard Simmer, PA-C Date: 12/02/2022 Duration: 23.51 mins  Patient history: 82yo M with seizure and 2 enhancing masses in bilateral frontal lobes. EEG to evaluate for seizure  Level of alertness: Awake, asleep  AEDs during EEG study: LEV  Technical aspects: This EEG study was done with scalp electrodes positioned according to the 10-20 International system of electrode placement. Electrical activity was reviewed with band pass filter of 1-70Hz , sensitivity of 7 uV/mm, display speed of 14mm/sec with a 60Hz  notched filter applied as appropriate. EEG data were recorded continuously and digitally stored.  Video monitoring was available and reviewed as appropriate.  Description: The posterior dominant rhythm consists of 8 Hz activity of moderate voltage (25-35 uV) seen predominantly in posterior head regions, symmetric and reactive to eye opening and eye closing. Drowsiness was characterized by attenuation of the posterior background rhythm. EEG showed intermittent generalized 3 to 6 Hz theta-delta slowing. Hyperventilation and photic stimulation were not performed.     ABNORMALITY - Intermittent slow, generalized  IMPRESSION: This study is suggestive of mild diffuse encephalopathy, nonspecific etiology. No seizures or epileptiform discharges were seen throughout the recording.  [name] Annabelle Harman
